# Patient Record
Sex: Female | Born: 1935 | Race: White | Hispanic: No | Smoking: Never smoker
Health system: Southern US, Community
[De-identification: ages and names within clinical notes are randomized; demographics above are authoritative.]

## PROBLEM LIST (undated history)

## (undated) DIAGNOSIS — N289 Disorder of kidney and ureter, unspecified: Secondary | ICD-10-CM

## (undated) DIAGNOSIS — H04129 Dry eye syndrome of unspecified lacrimal gland: Secondary | ICD-10-CM

## (undated) DIAGNOSIS — M353 Polymyalgia rheumatica: Secondary | ICD-10-CM

## (undated) DIAGNOSIS — E611 Iron deficiency: Secondary | ICD-10-CM

## (undated) DIAGNOSIS — G20 Parkinson's disease: Secondary | ICD-10-CM

## (undated) DIAGNOSIS — M8580 Other specified disorders of bone density and structure, unspecified site: Secondary | ICD-10-CM

## (undated) DIAGNOSIS — K5790 Diverticulosis of intestine, part unspecified, without perforation or abscess without bleeding: Secondary | ICD-10-CM

## (undated) DIAGNOSIS — I7300 Raynaud's syndrome without gangrene: Secondary | ICD-10-CM

## (undated) DIAGNOSIS — G20A1 Parkinson's disease without dyskinesia, without mention of fluctuations: Secondary | ICD-10-CM

## (undated) DIAGNOSIS — S32000A Wedge compression fracture of unspecified lumbar vertebra, initial encounter for closed fracture: Secondary | ICD-10-CM

## (undated) DIAGNOSIS — F32A Depression, unspecified: Secondary | ICD-10-CM

## (undated) DIAGNOSIS — C801 Malignant (primary) neoplasm, unspecified: Secondary | ICD-10-CM

## (undated) DIAGNOSIS — I10 Essential (primary) hypertension: Secondary | ICD-10-CM

## (undated) DIAGNOSIS — I471 Supraventricular tachycardia, unspecified: Secondary | ICD-10-CM

## (undated) DIAGNOSIS — K219 Gastro-esophageal reflux disease without esophagitis: Secondary | ICD-10-CM

## (undated) DIAGNOSIS — R002 Palpitations: Secondary | ICD-10-CM

## (undated) DIAGNOSIS — E079 Disorder of thyroid, unspecified: Secondary | ICD-10-CM

## (undated) DIAGNOSIS — M069 Rheumatoid arthritis, unspecified: Secondary | ICD-10-CM

## (undated) DIAGNOSIS — Z87440 Personal history of urinary (tract) infections: Secondary | ICD-10-CM

## (undated) DIAGNOSIS — M1990 Unspecified osteoarthritis, unspecified site: Secondary | ICD-10-CM

## (undated) DIAGNOSIS — G3184 Mild cognitive impairment, so stated: Secondary | ICD-10-CM

## (undated) DIAGNOSIS — E039 Hypothyroidism, unspecified: Secondary | ICD-10-CM

## (undated) DIAGNOSIS — I739 Peripheral vascular disease, unspecified: Secondary | ICD-10-CM

## (undated) DIAGNOSIS — E785 Hyperlipidemia, unspecified: Secondary | ICD-10-CM

## (undated) DIAGNOSIS — I2510 Atherosclerotic heart disease of native coronary artery without angina pectoris: Secondary | ICD-10-CM

## (undated) DIAGNOSIS — N6459 Other signs and symptoms in breast: Secondary | ICD-10-CM

## (undated) HISTORY — PX: ABDOMINAL HYSTERECTOMY: SHX81

## (undated) HISTORY — PX: JOINT REPLACEMENT: SHX530

## (undated) HISTORY — PX: SKIN BIOPSY: SHX1

## (undated) HISTORY — PX: TEMPORAL ARTERY BIOPSY / LIGATION: SUR132

## (undated) HISTORY — PX: POSTERIOR TIBIAL TENDON REPAIR: SHX6039

## (undated) HISTORY — PX: ABLATION: SHX5711

## (undated) HISTORY — PX: BACK SURGERY: SHX140

## (undated) HISTORY — PX: ANKLE SURGERY: SHX546

## (undated) HISTORY — PX: EYE SURGERY: SHX253

## (undated) HISTORY — PX: BREAST SURGERY: SHX581

## (undated) HISTORY — PX: BREAST EXCISIONAL BIOPSY: SUR124

## (undated) HISTORY — PX: OTHER SURGICAL HISTORY: SHX169

## (undated) HISTORY — PX: KYPHOPLASTY: SHX5884

## (undated) HISTORY — PX: TUBAL LIGATION: SHX77

---

## 1980-05-17 DIAGNOSIS — I219 Acute myocardial infarction, unspecified: Secondary | ICD-10-CM

## 1980-05-17 HISTORY — DX: Acute myocardial infarction, unspecified: I21.9

## 1983-05-18 HISTORY — PX: TOTAL ABDOMINAL HYSTERECTOMY W/ BILATERAL SALPINGOOPHORECTOMY: SHX83

## 1998-05-17 HISTORY — PX: OTHER SURGICAL HISTORY: SHX169

## 2001-05-17 DIAGNOSIS — Z9842 Cataract extraction status, left eye: Secondary | ICD-10-CM

## 2001-05-17 DIAGNOSIS — Z9841 Cataract extraction status, right eye: Secondary | ICD-10-CM

## 2001-05-17 HISTORY — DX: Cataract extraction status, right eye: Z98.41

## 2001-05-17 HISTORY — DX: Cataract extraction status, right eye: Z98.42

## 2001-05-17 HISTORY — PX: CATARACT EXTRACTION W/ INTRAOCULAR LENS  IMPLANT, BILATERAL: SHX1307

## 2004-04-14 ENCOUNTER — Ambulatory Visit: Payer: Self-pay | Admitting: Family Medicine

## 2004-05-17 DIAGNOSIS — Z853 Personal history of malignant neoplasm of breast: Secondary | ICD-10-CM

## 2004-05-17 DIAGNOSIS — C50919 Malignant neoplasm of unspecified site of unspecified female breast: Secondary | ICD-10-CM

## 2004-05-17 HISTORY — DX: Malignant neoplasm of unspecified site of unspecified female breast: C50.919

## 2004-05-17 HISTORY — DX: Personal history of malignant neoplasm of breast: Z85.3

## 2005-03-29 ENCOUNTER — Ambulatory Visit: Payer: Self-pay | Admitting: Family Medicine

## 2005-04-02 ENCOUNTER — Ambulatory Visit: Payer: Self-pay | Admitting: Family Medicine

## 2005-04-16 HISTORY — PX: BREAST LUMPECTOMY: SHX2

## 2005-05-17 HISTORY — PX: SVT ABLATION: EP1225

## 2005-08-31 DIAGNOSIS — I21A1 Myocardial infarction type 2: Secondary | ICD-10-CM

## 2005-08-31 HISTORY — DX: Myocardial infarction type 2: I21.A1

## 2005-09-01 HISTORY — PX: CARDIAC CATHETERIZATION: SHX172

## 2009-07-29 ENCOUNTER — Ambulatory Visit: Payer: Self-pay | Admitting: Family Medicine

## 2010-01-02 ENCOUNTER — Ambulatory Visit: Payer: Self-pay | Admitting: Internal Medicine

## 2011-05-18 DIAGNOSIS — I2699 Other pulmonary embolism without acute cor pulmonale: Secondary | ICD-10-CM

## 2011-05-18 HISTORY — DX: Other pulmonary embolism without acute cor pulmonale: I26.99

## 2012-01-10 HISTORY — PX: TOTAL KNEE ARTHROPLASTY: SHX125

## 2012-01-21 ENCOUNTER — Emergency Department: Payer: Self-pay | Admitting: Emergency Medicine

## 2012-01-21 LAB — COMPREHENSIVE METABOLIC PANEL
Albumin: 3 g/dL — ABNORMAL LOW (ref 3.4–5.0)
Alkaline Phosphatase: 76 U/L (ref 50–136)
Anion Gap: 4 — ABNORMAL LOW (ref 7–16)
BUN: 20 mg/dL — ABNORMAL HIGH (ref 7–18)
Bilirubin,Total: 0.8 mg/dL (ref 0.2–1.0)
Calcium, Total: 9.1 mg/dL (ref 8.5–10.1)
Creatinine: 0.65 mg/dL (ref 0.60–1.30)
Glucose: 93 mg/dL (ref 65–99)
Osmolality: 276 (ref 275–301)
Potassium: 3.8 mmol/L (ref 3.5–5.1)
SGPT (ALT): 17 U/L (ref 12–78)
Sodium: 137 mmol/L (ref 136–145)

## 2012-01-21 LAB — URINALYSIS, COMPLETE
Bilirubin,UR: NEGATIVE
Leukocyte Esterase: NEGATIVE
Nitrite: NEGATIVE
Protein: NEGATIVE
RBC,UR: 5 /HPF (ref 0–5)
Specific Gravity: 1.026 (ref 1.003–1.030)
WBC UR: 1 /HPF (ref 0–5)

## 2012-01-21 LAB — CBC
HCT: 29.2 % — ABNORMAL LOW (ref 35.0–47.0)
MCH: 30.7 pg (ref 26.0–34.0)
MCHC: 34.5 g/dL (ref 32.0–36.0)
RDW: 13.4 % (ref 11.5–14.5)
WBC: 14.4 10*3/uL — ABNORMAL HIGH (ref 3.6–11.0)

## 2012-01-30 ENCOUNTER — Emergency Department: Payer: Self-pay | Admitting: Emergency Medicine

## 2012-01-30 LAB — COMPREHENSIVE METABOLIC PANEL
Albumin: 3 g/dL — ABNORMAL LOW (ref 3.4–5.0)
Alkaline Phosphatase: 76 U/L (ref 50–136)
BUN: 14 mg/dL (ref 7–18)
Bilirubin,Total: 0.4 mg/dL (ref 0.2–1.0)
Calcium, Total: 9.1 mg/dL (ref 8.5–10.1)
Co2: 26 mmol/L (ref 21–32)
Creatinine: 0.67 mg/dL (ref 0.60–1.30)
EGFR (African American): >60
EGFR (Non-African Amer.): >60
Osmolality: 277 (ref 275–301)
SGOT(AST): 32 U/L (ref 15–37)
Sodium: 139 mmol/L (ref 136–145)

## 2012-01-30 LAB — URINALYSIS, COMPLETE
Bilirubin,UR: NEGATIVE
Ketone: NEGATIVE
Ph: 8 (ref 4.5–8.0)
Squamous Epithelial: NONE SEEN

## 2012-01-30 LAB — CBC
MCH: 30.1 pg (ref 26.0–34.0)
MCHC: 33.7 g/dL (ref 32.0–36.0)
Platelet: 483 10*3/uL — ABNORMAL HIGH (ref 150–440)
RBC: 3.6 10*6/uL — ABNORMAL LOW (ref 3.80–5.20)
RDW: 14.9 % — ABNORMAL HIGH (ref 11.5–14.5)

## 2012-01-30 LAB — PROTIME-INR: INR: 1.1

## 2012-01-30 LAB — LIPASE, BLOOD: Lipase: 162 U/L (ref 73–393)

## 2012-01-30 LAB — APTT: Activated PTT: 36 secs — ABNORMAL HIGH (ref 23.6–35.9)

## 2012-04-07 ENCOUNTER — Ambulatory Visit: Payer: Self-pay

## 2012-04-07 LAB — URINALYSIS, COMPLETE
Glucose,UR: NEGATIVE mg/dL (ref 0–75)
Nitrite: NEGATIVE
Protein: 100
Specific Gravity: >=1.03 (ref 1.003–1.030)

## 2012-04-07 LAB — CBC WITH DIFFERENTIAL/PLATELET
Basophil #: 0.1 10*3/uL (ref 0.0–0.1)
Basophil %: 1.1 %
Eosinophil #: 0.1 10*3/uL (ref 0.0–0.7)
Eosinophil %: 0.8 %
HCT: 41 % (ref 35.0–47.0)
HGB: 13.5 g/dL (ref 12.0–16.0)
Lymphocyte %: 17 %
Monocyte #: 0.9 x10 3/mm (ref 0.2–0.9)
Monocyte %: 9.1 %
Neutrophil #: 6.8 10*3/uL — ABNORMAL HIGH (ref 1.4–6.5)
Neutrophil %: 72 %
RBC: 4.65 10*6/uL (ref 3.80–5.20)
WBC: 9.4 10*3/uL (ref 3.6–11.0)

## 2012-04-07 LAB — BASIC METABOLIC PANEL
Anion Gap: 6 — ABNORMAL LOW (ref 7–16)
Chloride: 102 mmol/L (ref 98–107)
Co2: 31 mmol/L (ref 21–32)
EGFR (Non-African Amer.): >60
Osmolality: 279 (ref 275–301)

## 2012-04-07 LAB — PROTIME-INR
INR: 1.8
Prothrombin Time: 21.5 secs — ABNORMAL HIGH (ref 11.5–14.7)

## 2012-04-08 LAB — URINE CULTURE

## 2012-05-11 ENCOUNTER — Emergency Department: Payer: Self-pay | Admitting: Emergency Medicine

## 2012-05-11 LAB — URINALYSIS, COMPLETE
Bacteria: NONE SEEN
Glucose,UR: NEGATIVE mg/dL (ref 0–75)
Ketone: NEGATIVE
Nitrite: NEGATIVE
RBC,UR: 13 /HPF (ref 0–5)
Specific Gravity: 1.015 (ref 1.003–1.030)
WBC UR: <1 /HPF (ref 0–5)

## 2012-05-20 LAB — PROTIME-INR: INR: 2.6

## 2012-05-21 ENCOUNTER — Inpatient Hospital Stay: Payer: Self-pay | Admitting: Family Medicine

## 2012-05-21 LAB — CBC WITH DIFFERENTIAL/PLATELET
Basophil #: 0.1 10*3/uL (ref 0.0–0.1)
Basophil #: 0 10*3/uL (ref 0.0–0.1)
Basophil %: 0.5 %
Basophil %: 0.2 %
Eosinophil #: 0 10*3/uL (ref 0.0–0.7)
Eosinophil %: 0.2 %
Eosinophil: 1 %
HCT: 29.9 % — ABNORMAL LOW (ref 35.0–47.0)
HCT: 26.4 % — ABNORMAL LOW (ref 35.0–47.0)
HCT: 22.2 % — ABNORMAL LOW (ref 35.0–47.0)
HGB: 9.5 g/dL — ABNORMAL LOW (ref 12.0–16.0)
HGB: 7.3 g/dL — ABNORMAL LOW (ref 12.0–16.0)
Lymphocyte #: 1.2 10*3/uL (ref 1.0–3.6)
Lymphocyte #: 1.3 10*3/uL (ref 1.0–3.6)
MCH: 27.4 pg (ref 26.0–34.0)
MCH: 28.2 pg (ref 26.0–34.0)
MCHC: 31.8 g/dL — ABNORMAL LOW (ref 32.0–36.0)
MCHC: 33.1 g/dL (ref 32.0–36.0)
MCHC: 33.1 g/dL (ref 32.0–36.0)
MCV: 86 fL (ref 80–100)
MCV: 85 fL (ref 80–100)
Monocyte #: 1.2 x10 3/mm — ABNORMAL HIGH (ref 0.2–0.9)
Monocyte %: 8.3 %
Neutrophil #: 12 10*3/uL — ABNORMAL HIGH (ref 1.4–6.5)
Neutrophil %: 77.9 %
Neutrophil %: 82.9 %
Platelet: 295 10*3/uL (ref 150–440)
RBC: 3.46 10*6/uL — ABNORMAL LOW (ref 3.80–5.20)
RBC: 3.09 10*6/uL — ABNORMAL LOW (ref 3.80–5.20)
RBC: 2.6 10*6/uL — ABNORMAL LOW (ref 3.80–5.20)
Segmented Neutrophils: 76 %
WBC: 10.8 10*3/uL (ref 3.6–11.0)

## 2012-05-21 LAB — CBC
HCT: 39.5 % (ref 35.0–47.0)
HGB: 12.8 g/dL (ref 12.0–16.0)
MCH: 28 pg (ref 26.0–34.0)
MCHC: 32.4 g/dL (ref 32.0–36.0)
MCV: 86 fL (ref 80–100)
Platelet: 372 10*3/uL (ref 150–440)
RBC: 4.58 10*6/uL (ref 3.80–5.20)
RDW: 15.4 % — ABNORMAL HIGH (ref 11.5–14.5)
WBC: 10.2 10*3/uL (ref 3.6–11.0)

## 2012-05-21 LAB — BASIC METABOLIC PANEL
BUN: 17 mg/dL (ref 7–18)
Calcium, Total: 9.3 mg/dL (ref 8.5–10.1)
Co2: 30 mmol/L (ref 21–32)
Creatinine: 0.81 mg/dL (ref 0.60–1.30)
EGFR (Non-African Amer.): >60
Osmolality: 284 (ref 275–301)
Potassium: 4 mmol/L (ref 3.5–5.1)
Sodium: 141 mmol/L (ref 136–145)

## 2012-05-21 LAB — DIFFERENTIAL
Lymphocytes: 16 %
Monocytes: 4 %

## 2012-05-22 LAB — CBC WITH DIFFERENTIAL/PLATELET
Basophil #: 0 10*3/uL (ref 0.0–0.1)
Basophil #: 0.1 10*3/uL (ref 0.0–0.1)
Basophil %: 0.1 %
Basophil %: 0.3 %
Eosinophil #: 0 10*3/uL (ref 0.0–0.7)
Eosinophil #: 0 10*3/uL (ref 0.0–0.7)
Eosinophil %: 0 %
Eosinophil %: 0 %
HCT: 23.5 % — ABNORMAL LOW (ref 35.0–47.0)
HGB: 6.7 g/dL — ABNORMAL LOW (ref 12.0–16.0)
Lymphocyte #: 1.4 10*3/uL (ref 1.0–3.6)
Lymphocyte #: 1.2 10*3/uL (ref 1.0–3.6)
Lymphocyte %: 7.1 %
Lymphocyte %: 10.3 %
MCH: 27.3 pg (ref 26.0–34.0)
MCH: 26.9 pg (ref 26.0–34.0)
MCHC: 36 g/dL (ref 32.0–36.0)
MCHC: 32.2 g/dL (ref 32.0–36.0)
MCV: 85 fL (ref 80–100)
MCV: 85 fL (ref 80–100)
Monocyte %: 9.2 %
Monocyte %: 13.5 %
Neutrophil #: 14.1 10*3/uL — ABNORMAL HIGH (ref 1.4–6.5)
Neutrophil %: 78.9 %
Neutrophil %: 83.6 %
Neutrophil %: 75.9 %
RBC: 2.78 10*6/uL — ABNORMAL LOW (ref 3.80–5.20)
RBC: 2.49 10*6/uL — ABNORMAL LOW (ref 3.80–5.20)
RDW: 15.2 % — ABNORMAL HIGH (ref 11.5–14.5)
RDW: 15.6 % — ABNORMAL HIGH (ref 11.5–14.5)
RDW: 15.4 % — ABNORMAL HIGH (ref 11.5–14.5)
WBC: 16.6 10*3/uL — ABNORMAL HIGH (ref 3.6–11.0)
WBC: 17.5 10*3/uL — ABNORMAL HIGH (ref 3.6–11.0)
WBC: 18.5 10*3/uL — ABNORMAL HIGH (ref 3.6–11.0)

## 2012-05-22 LAB — URINALYSIS, COMPLETE
Glucose,UR: NEGATIVE mg/dL (ref 0–75)
Ketone: NEGATIVE
Nitrite: NEGATIVE
Ph: 5 (ref 4.5–8.0)
Protein: NEGATIVE
Squamous Epithelial: <1

## 2012-05-22 LAB — BASIC METABOLIC PANEL
Anion Gap: 7 (ref 7–16)
BUN: 25 mg/dL — ABNORMAL HIGH (ref 7–18)
Chloride: 105 mmol/L (ref 98–107)
Co2: 30 mmol/L (ref 21–32)
Creatinine: 0.71 mg/dL (ref 0.60–1.30)
EGFR (African American): >60
EGFR (Non-African Amer.): >60
Glucose: 136 mg/dL — ABNORMAL HIGH (ref 65–99)
Sodium: 142 mmol/L (ref 136–145)

## 2012-05-22 LAB — PROTIME-INR
INR: 1.2
Prothrombin Time: 15.5 secs — ABNORMAL HIGH (ref 11.5–14.7)

## 2012-05-23 LAB — CBC WITH DIFFERENTIAL/PLATELET
Eosinophil #: 0 10*3/uL (ref 0.0–0.7)
Eosinophil %: 0.2 %
HCT: 25.2 % — ABNORMAL LOW (ref 35.0–47.0)
Lymphocyte %: 14.2 %
MCH: 28.9 pg (ref 26.0–34.0)
MCHC: 33.8 g/dL (ref 32.0–36.0)
MCV: 89 fL (ref 80–100)
MCV: 88 fL (ref 80–100)
Monocyte #: 2.3 x10 3/mm — ABNORMAL HIGH (ref 0.2–0.9)
Monocyte %: 7.5 %
Neutrophil #: 10.8 10*3/uL — ABNORMAL HIGH (ref 1.4–6.5)
Platelet: 187 10*3/uL (ref 150–440)
RBC: 2.55 10*6/uL — ABNORMAL LOW (ref 3.80–5.20)
RBC: 2.88 10*6/uL — ABNORMAL LOW (ref 3.80–5.20)
RDW: 15 % — ABNORMAL HIGH (ref 11.5–14.5)
WBC: 14.4 10*3/uL — ABNORMAL HIGH (ref 3.6–11.0)
WBC: 12.8 10*3/uL — ABNORMAL HIGH (ref 3.6–11.0)

## 2012-05-23 LAB — BASIC METABOLIC PANEL
Anion Gap: 5 — ABNORMAL LOW (ref 7–16)
Calcium, Total: 8.1 mg/dL — ABNORMAL LOW (ref 8.5–10.1)
Co2: 30 mmol/L (ref 21–32)
Creatinine: 0.53 mg/dL — ABNORMAL LOW (ref 0.60–1.30)
Osmolality: 283 (ref 275–301)
Potassium: 3.8 mmol/L (ref 3.5–5.1)
Sodium: 141 mmol/L (ref 136–145)

## 2012-05-23 LAB — URINE CULTURE

## 2012-05-23 LAB — TROPONIN I: Troponin-I: <0.02 ng/mL

## 2012-05-24 LAB — CBC WITH DIFFERENTIAL/PLATELET
Basophil #: 0.1 10*3/uL (ref 0.0–0.1)
HCT: 21.3 % — ABNORMAL LOW (ref 35.0–47.0)
Lymphocyte #: 2.1 10*3/uL (ref 1.0–3.6)
Lymphocyte %: 20.6 %
MCH: 28.9 pg (ref 26.0–34.0)
Neutrophil #: 6.5 10*3/uL (ref 1.4–6.5)
Platelet: 220 10*3/uL (ref 150–440)
RBC: 2.43 10*6/uL — ABNORMAL LOW (ref 3.80–5.20)
WBC: 10.1 10*3/uL (ref 3.6–11.0)

## 2012-07-11 ENCOUNTER — Ambulatory Visit: Payer: Self-pay | Admitting: Family Medicine

## 2012-07-11 LAB — URINALYSIS, COMPLETE
Bilirubin,UR: NEGATIVE
Glucose,UR: NEGATIVE mg/dL (ref 0–75)
Ph: 6 (ref 4.5–8.0)
RBC,UR: >30 /HPF (ref 0–5)
Specific Gravity: 1.015 (ref 1.003–1.030)

## 2012-08-21 HISTORY — PX: ESOPHAGOGASTRODUODENOSCOPY: SHX1529

## 2012-11-27 ENCOUNTER — Ambulatory Visit: Payer: Self-pay

## 2012-11-27 LAB — URINALYSIS, COMPLETE
Bilirubin,UR: NEGATIVE
Ketone: NEGATIVE
Nitrite: NEGATIVE
Ph: 5 (ref 4.5–8.0)
Protein: NEGATIVE
Squamous Epithelial: NONE SEEN
WBC UR: >30 /HPF (ref 0–5)

## 2013-06-08 ENCOUNTER — Ambulatory Visit: Payer: Self-pay | Admitting: Family Medicine

## 2013-06-08 LAB — URINALYSIS, COMPLETE
Bilirubin,UR: NEGATIVE
GLUCOSE, UR: NEGATIVE mg/dL (ref 0–75)
KETONE: NEGATIVE
Nitrite: POSITIVE
Ph: 6 (ref 4.5–8.0)
SPECIFIC GRAVITY: 1.02 (ref 1.003–1.030)
WBC UR: >30 /HPF (ref 0–5)

## 2013-06-10 LAB — URINE CULTURE

## 2013-08-07 ENCOUNTER — Ambulatory Visit: Payer: Self-pay | Admitting: Family Medicine

## 2013-08-07 LAB — URINALYSIS, COMPLETE
BILIRUBIN, UR: NEGATIVE
Glucose,UR: NEGATIVE mg/dL (ref 0–75)
KETONE: NEGATIVE
NITRITE: NEGATIVE
Ph: 6 (ref 4.5–8.0)
Specific Gravity: >=1.03 (ref 1.003–1.030)

## 2013-08-09 LAB — URINE CULTURE

## 2014-01-30 ENCOUNTER — Ambulatory Visit: Payer: Self-pay | Admitting: Physician Assistant

## 2014-01-30 LAB — URINALYSIS, COMPLETE
GLUCOSE, UR: NEGATIVE
NITRITE: NEGATIVE
Ph: 7 (ref 5.0–8.0)
Protein: 100
Specific Gravity: 1.02 (ref 1.000–1.030)
Squamous Epithelial: NONE SEEN

## 2014-02-01 LAB — URINE CULTURE

## 2014-03-07 ENCOUNTER — Ambulatory Visit: Payer: Self-pay | Admitting: Emergency Medicine

## 2014-03-07 LAB — URINALYSIS, COMPLETE
BACTERIA: NEGATIVE
Squamous Epithelial: NONE SEEN

## 2014-03-09 LAB — URINE CULTURE

## 2014-07-29 ENCOUNTER — Ambulatory Visit: Payer: Self-pay | Admitting: Emergency Medicine

## 2014-07-31 ENCOUNTER — Inpatient Hospital Stay: Payer: Self-pay | Admitting: Internal Medicine

## 2014-09-06 NOTE — H&P (Signed)
PATIENT NAME:  Tracy Long, Tracy Long MR#:  [id number] DATE OF BIRTH:  1934-10-30  DATE OF ADMISSION:  05/21/2012  PRIMARY CARE PHYSICIAN:  Dr. Derwood Kaplan.  CHIEF COMPLAINT:  Right low back pain.   HISTORY OF PRESENT ILLNESS:  A 80 year old female with a history of postop bilateral PE diagnosed September 2013, presents with right pain over her gluteal area.  The patient notes that she was in her usual state of health today, was about to walk her dog when she felt a "tiny sting" in the right gluteal region.  She proceeded to walk her dog and 30 minutes later upon palpation of that area felt a palm-sized fullness with tenderness.  Since then she has continued to notice swelling in the area.  She called her orthopedic surgeon and was advised to present to the Emergency Department so she could get an INR checked.  Upon evaluation in the Emergency Department, patient was noted to have on CAT scan a large superficial hematoma measuring 12 x 3.8 cm, so we are called for admission.   Of note, the patient currently is on dual anticoagulation with Coumadin and Lovenox because she had an epidural injection for spinal stenosis and subsequent low back pain on December 26.  Apparently for 7 days prior to the 26th she was off of Coumadin and after the injection she is being bridged with Lovenox and Coumadin and the plan was for her to present to the [facility] on January 7th to reassess her INR and then discontinue Lovenox, however, she has now presented to the Emergency Department in the interim.  She denies any trauma or precipitating events.  She cannot identify any alleviating or aggravating factors.  She has been icing the area since onset.  The patient had been complaining of increasing shoulder pains and was diagnosed with polymyalgia rheumatica and started on prednisone therapy.  She started prednisone 1 day prior to presentation and has only had 2 doses thus far.    PAST MEDICAL HISTORY:  1.  Bilateral  PEs diagnosed 01/21/2012, after total knee replacement 01/10/2012. 2.  Notable for coronary artery disease, status post MI x 2.  3.  Peripheral vascular disease, status post femoral artery stenting.  4.  Hypertension.  5.  History of benign fibroids bilateral breast, status post excision.  6.  History of right early breast cancer, status post right breast lumpectomy.  7.  History of cardiac ablation for tachyarrhythmia.  8.  Recently diagnosed polymyalgia rheumatica.  9.  Giant cell arteritis.  10.  Raynaud.  11.  Hypothyroidism.  12.  Diverticulosis.  13.  Internal hemorrhoids.    PAST SURGICAL HISTORY: 1.  Total knee replacement, left, 01/10/2012.  2.  Excision of benign fibroids bilateral breasts.  3.  Hysterectomy.  4.  Femoral artery stent placement.  5.  Cataract surgery bilaterally.  6.  Right breast lumpectomy.  7.  Cardiac ablation.  8.  Triple arthrodesis, left foot.  9.  Colonoscopy.  10.  Kyphoplasty.   MEDICATIONS: 1.  Tylenol 500 mg 2 tablets by mouth every 6 hours as needed.  2.  Fosamax 35 mg weekly.  3.  Zyrtec 10 mg daily.  4.  Valium 10 mg 1 to 2 tablets by mouth every 8 hours as needed for muscle spasm.  5.  Lovenox 80 mg subcutaneously every 12 hours.  6.  Metoprolol succinate 25 mg daily.  7.  Multivitamin Centrum Silver daily.  8.  Roxicodone 5 mg 1 to 2 tablets  every 4 to 6 hours as needed for pain. 9.  Paroxetine 10 mg half tablet (5 mg) by mouth daily.  10.  Synthroid 75 mcg daily.  11.  Coumadin 7.5 mg Monday and Wednesday.  12.  Coumadin 5 mg every day except Monday and Wednesday.  13.  Prednisone 20 mg daily and this was just started 05/19/2012 for polymyalgia rheumatica.    SOCIAL HISTORY:  Denies tobacco, alcohol or illicit drug use.   FAMILY HISTORY:  Noncontributory.   REVIEW OF SYSTEMS:  CONSTITUTIONAL:  Denies fevers, fatigue.  EYES:  Denies blurred vision, double vision.  Does have history of cataracts status post surgery.  EARS,  NOSE, THROAT:  Denies tinnitus, ear pain, epistaxis.  RESPIRATIONS:  Denies cough, wheeze, dyspnea, does have history of PE.  CARDIOVASCULAR:  Denies chest pain, edema, palpitations.  GASTROINTESTINAL:  Denies nausea, vomiting, diarrhea, abdominal pain.  GENITOURINARY:  Denies frequency, urgency or dysuria.   INTEGUMENTARY:  Denies any new skin rashes, although she did note the expanding hematoma in the gluteal region this evening.    HEMATOLOGY:  Denies prior history of easy bruising.  MUSCULOSKELETAL:  Has chronic arthritis, chronic bilateral shoulder pain.  No joint swelling.  NEUROLOGIC:  Denies numbness, tingling, dysarthria, headache.  PSYCHIATRIC:  Denies depression, anxiety.   PHYSICAL EXAMINATION: VITAL SIGNS:  Temp 97.6, heart rate 70, respirations 20, blood pressure 166/72, saturating 93% on room air.  GENERAL:  Elderly Caucasian female in mild distress due to pain.  PSYCHIATRIC:  Awake, alert and oriented x 3.  Judgment and insight are intact.   EYES:  Pupils are equally round and reactive to light and accommodation.  Anicteric sclerae.  No conjunctivitis.  EARS, NOSE, THROAT:  Normal external ears and nares.  OROPHARYNX:  Clear without erythema.  RESPIRATIONS:  Breath sounds are normal with normal effort.  CARDIOVASCULAR:  S1, S2, regular rate and rhythm.  No murmurs appreciated.  No pretibial edema.  ABDOMEN:  Soft, nontender, nondistended.  SKIN:  There is a large diameter subcutaneous hematoma on the right gluteal region almost covering the entire gluteal area.  There is only a small superficial ecchymosis noted, however, the hematoma is much deeper and much wider.  It measures 18 cm x 13 cm.  It is tender to palpation.  No other rashes are appreciated or lesions.  Skin is otherwise warm and dry.  MUSCULOSKELETAL:  Full range of motion bilateral upper and lower extremity.  There is some limitation in the movement of the right hip due to pain.     LABORATORY DATA:  INR is 2.6.   PT is 28.1.  WBC is 10.2, hemoglobin is 12.8, hematocrit is 39.5, platelet count is 372, MCV is 86.   Glucose 124, BUN of 17, creatinine 0.81, sodium 141, potassium 4, chloride 105, bicarb 30, calcium 9.3, anion gap of 6, osmolality 284.   ABO group is O+ with negative antibody screen.   ASSESSMENT AND PLAN:  A 80 year old female on dual anticoagulation for pulmonary embolus since December 26 due to recent epidural injection, presents with spontaneous superficial large hematoma of the right gluteal region.  1.  Right gluteal hematoma.  At this point we will reverse her coagulopathy with fresh frozen plasma.  She is not supratherapeutic with her INR, but given the active bleeding we will reverse it.  We will give her vitamin K as well.  If it continues to expand, she might need surgical evacuation.  Continue pain medications as needed.  She might  need a repeat CT to ensure that the hematoma is no longer expanding over the next 24 to 48 hours.  We will hold her anticoagulation at this point.  We will check CBCs every 12 hours for the next 24 hours to assess for anemia of acute blood loss. 2.  Polymyalgia rheumatica.  Continue prednisone.  3.  Hypothyroidism.  Continue Synthroid.  4.  Hypertension.  Continue metoprolol.   5.  Continue all home medications.  6.  Deep vein thrombosis prophylaxis:  Pneumatic compression boots.   CODE STATUS:  FULL CODE:    SURROGATE DECISION-MAKER:  Her husband, Tracy Long.   TIME SPENT COORDINATING ADMISSION:  Forty-five minutes.     ____________________________ Samson Frederic, DO aeo:ea D: 05/21/2012 01:12:00 ET T: 05/21/2012 04:18:31 ET JOB#: [id number]  cc: Dr. Redmond Pulling Dr. Aaron Edelman Hallstetter Samson Frederic, DO, <Dictator>   [place] E Melba Araki DO ELECTRONICALLY SIGNED 07/11/2012 3:43

## 2014-09-06 NOTE — Discharge Summary (Signed)
PATIENT NAME:  Tracy Long, Tracy Long MR#:  [id number] DATE OF BIRTH:  1934-12-03  DATE OF ADMISSION:  05/21/2012 DATE OF DISCHARGE:  05/25/2012  ADMISSION DIAGNOSIS:  Hematoma of the lower back.   DISCHARGE DIAGNOSES:   1.  Hematoma of the lower back after epidural injection.  2.  Anticoagulation with Coumadin, now stopped.  3.  Shock, hypovolemic and hemorrhagic.  4.  History of pulmonary embolus after total knee replacement and deep vein thrombosis.  5.  Now ultrasound Doppler of the lower extremities shows no longer deep vein thrombosis.  6.  Polymyalgia rheumatica on prednisone.  7.  Hypothyroidism.  8.  Hypertension.  9.  History of atrial fibrillation status post ablation. The patient is always tachycardic for which she takes metoprolol.  10.  Anemia of acute blood loss from her hematoma status post transfusions.  11.  Increased white blood cells due to transfusions.   FOLLOWUP:   1.  With Dr. Arlana Hove at [facility].  2.  With Dr. Sharyne Richters, spine surgeon, from [facility].  3.  With pain management, Dr. Maricela Curet, from [facility].   MEDICATIONS AT DISCHARGE:  Zyrtec 10 mg once daily, Synthroid 75 mcg once daily, paroxetine 5 mg once daily, alendronate 35 mg once weekly, metoprolol succinate extended release 25 mg once daily, Centrum Silver once a day, Valium 2 mg 1 to 2 tablets as needed for pain, oxycodone 5 mg 1 to 2 tablets every 4 hours p.r.n. pain, Tylenol p.r.n. pain, prednisone 20 mg once daily, Niferex 150 mg twice daily, aspirin, enteric coated 325 mg (do not take this medication until you see your primary care physician again).   DIAGNOSTIC DATA:  CT scan of the pelvis without contrast showed a large superficial hematoma that extends from the level of L5 to mid sacrum. She had a repeat CT scan done on January 6th that showed extension of that same hematoma with expansion and abnormal attenuation in the subcutaneous tissue of the posterior pelvic  region. Her creatinine was 0.8 and remained normal. Her hemoglobin on admission was 12.8 and went down to 7.3 the following day after admission and the lowest it got was 6.7. The patient was transfused about 3 units of fresh frozen plasma and 4 units of blood. Her INR on admission was 2.6 and at discharge was 1.0. UA did not show any major signs of infection. White blood cells were elevated at 11, but no growth on urine culture. EKG: Normal sinus rhythm.   HOSPITAL COURSE:  The patient is a very nice 80 year old female who comes to the hospital with a history of pain on her gluteal area after she started walking and then noticed some bruising and increase of size and fullness of her lower back. The patient went to the Emergency Department and she was found to have a superficial hematoma measuring 12 x 3.8 cm for which she was admitted at that moment. She was anticoagulated with Coumadin and Lovenox due to the fact that the patient had an epidural injection due to spinal stenosis and that epidural injection was done on December 26th. She was off of Coumadin after the injection and she was bridged with Lovenox. She had an appointment to see the Coumadin Clinic on January 7th, but unfortunately she started having these problems before. The patient was admitted to the floor. She was getting treatments with pain medications and she was getting first 2 units of FFP and she closely monitored. On the floor, she started becoming  hypotensive on January 6th for which she was transferred to the ICU. She was tachycardic and her blood pressure went down to 60s/40s for which she was transfused with 2 units of packed red blood cells. She also had another unit of fresh frozen plasma. The case was discussed with Dr. Redmond Pulling who is one of the pain management doctors from [facility]. He felt confident that the patient did not need any surgical management and overall we felt comfortable with that. We did not think that she  would benefit from it due to her anticoagulation anyway. She had monitoring of her hemoglobin and her hemoglobin dropped down to 6.4 which was the lowest. She had several blood transfusions, about 4, and overall her shock resolved.   As far as problems:  1.  Shock, hypovolemic and hemorrhagic. The patient was transfused with blood and fluids were given. Her blood pressure was as low as 60/40. She did not require any pressors after her anticoagulation was reversed and she was transfused and the shock resolved. She had significant signs of hypoperfusion, clammy skin, very pale and decreased mentation. All this resolved after the volume was restored.  2.  Hematoma. The patient received FFP. A followup CT scan showed growth of the hematoma, but clinically, she started showing signs of decreased volume and her hemodynamic instability improved for which no repeat CT scan at discharge was done. Her INR was 2.6 and after reversal was 1.0.  3.  History of PE. The patient has a history of a pulmonary embolus that happened right after a total knee replacement. The patient had a small DVT at that moment. She was anticoagulated for over 4 months. We repeated ultrasound of the lower extremities over here that did not show any blood clots due to this problem. We recommend to stop anticoagulation at this moment and follow up with her primary care physician to make a decision of whether to continue or stop anticoagulation. We recommend to stop since the patient has been treated for over 3 months and there are no more signs of DVT on her ultrasound.  4.  Tachycardia. The patient has history of ablation and she takes metoprolol due to her tachycardia. She was normal sinus rhythm during this hospitalization. She had several episodes of fluttering of her chest that resolved after transfusions and with some morphine occasionally. There was no elevation of her cardiac enzymes and no changes in her EKG.  5.  Acute blood loss anemia  due to her hematoma. The patient is discharged on iron pills.  6.  Other medical problems were stable during this hospitalization.   TIME SPENT:  I spent about 80 minutes with this discharge.    ____________________________ Combine Sink, MD rsg:si D: 05/25/2012 15:57:11 ET T: 05/25/2012 21:10:32 ET JOB#: [id number]  cc: [place] Sink, MD, <Dictator> Lalla Brothers, MD Joyice Faster. Dimmig, MD Norvel Richards, MD  [place]las MD ELECTRONICALLY SIGNED 05/26/2012 13:48

## 2014-09-06 NOTE — Discharge Summary (Signed)
PATIENT NAME:  TAMORAH, HADA MR#:  [id number] DATE OF BIRTH:  08/08/1934  DATE OF ADMISSION:  05/21/2012 DATE OF DISCHARGE:  05/25/2012  ADMISSION DIAGNOSIS: Hematoma.  DISCHARGE DIAGNOSES: 1. Hematoma after epidural.  2. Hypovolemic and hemorrhagic shock.  3. History of pulmonary embolus.  4. History of polymyalgia rheumatica. 5. Hypothyroidism.  6. Anemia of chronic disease.   CONSULTS: Wound consult.   LABORATORY AND RADIOLOGICAL DATA AT DISCHARGE:  Hemoglobin is 8.5 CT scan of the pelvis showed expansion of abnormal attenuation and subcutaneous tissues of the posterior pelvic region which can be consistent with an enlarging hematoma.   HOSPITAL COURSE: This is a 80 year old female with a history of PE on anticoagulation who had an epidural prior and presented with a large hematoma. For further details, please refer to the H and P.   1. Shock: The patient presented with hypotension and blood pressure as low as 60/40 with tachycardia, secondary to hypovolemia and hemorrhage. This has now resolved with appropriate treatment for hematoma as outlined below.  2. Hematoma: Status post a total of 4 units of FFP as well as 3 units of  PRBCs. Her hemoglobin remained stable. She is off of Coumadin and should remain off of Coumadin. She has a large hematoma on the back which is bilateral in nature, and she has a few blisters as well.  3. History of pulmonary emboli after an orthopedic procedure: The patient had this in September. She has been on therapy for 3 months. At this time, anticoagulation is not indicated. Her Doppler ultrasound was negative for DVT. She has received 3 months of therapy, and it is reasonable to stop anticoagulation at this time.  4. History of polymyalgia rheumatica: The patient will resume prednisone. 5. Hypothyroidism: On Synthroid.  6. Hypertension: On metoprolol.  7. Anemia, acute on chronic: Status post 3 units of PRBCs.   DISCHARGE MEDICATIONS: 1. Zyrtec 10 mg  daily. 2. Synthroid 75 mcg daily.  3. Paxil 5 mg daily.  4. Alendronate 35 mg weekly.  5. Metoprolol 25 mg daily.  6. Centrum Silver 1 tablet daily.  7. Valium 2 mg, 1 to 2 tablets every 8 hours p.r.n. muscle spasm.  8. Oxycodone 5 mg, 1 to 2 tablets q. 4 to 6 hours p.r.n. pain.  9. Acetaminophen 500 mg, 2 tablets q. 6 hours.  10. Prednisone 20 mg daily.  11. Niferex 150 mg b.i.d.  12. Aspirin 325 daily.   NOTE: The patient will stop taking Lovenox and Coumadin.   DISCHARGE REFERRALS: Home health and with nurse and wound care.   DISCHARGE DIET: Regular.   DISCHARGE FOLLOWUP:  1. Dr. Arlana Hove, [facility], in 5 to 7 days.  2. Dr. Joselyn Arrow, Spine Surgery, in 1 to 2 weeks. 3. Pain Management in 1 to 2 weeks.   TIME SPENT: Approximately 35 minutes.  ____________________________ Donell Beers. Benjie Karvonen, MD spm:cb  D: 05/25/2012 13:38:45 ET T: 05/25/2012 15:36:44 ET JOB#: [id number] cc: Braydon Kullman P. Benjie Karvonen, MD, <Dictator>  Dr. Arlana Hove, [facility] Caelie [place] P Cam Harnden MD ELECTRONICALLY SIGNED 05/25/2012 19:52

## 2014-09-15 NOTE — H&P (Signed)
PATIENT NAME:  Tracy Long, Tracy Long MR#:  [id number] DATE OF BIRTH:  October 26, 1934  DATE OF ADMISSION:  07/31/2014  PRIMARY CARE PROVIDER: [facility].  EMERGENCY DEPARTMENT REFERRING: Dr. Jimmye Norman   CHIEF COMPLAINT: Substernal chest pressure.   HISTORY OF PRESENT ILLNESS: The patient is a 80 year old white female with a history of having pulmonary embolism after total knee replacement, history of coronary artery disease with MI x 2, according to her a long time ago, which did not require any type of intervention, who also is under a lot of stress and has been depressed and grieving due to her husband passing away in January, who developed substernal chest pressure starting this 8:00 a.m. like an elephant sitting on her chest. She also had pain radiating on both sides of her chest as well as her shoulders. She did not have any shortness of breath. Did not have any nausea, vomiting or any tingling. The patient came to the ED with these symptoms and was noted to have a troponin that was elevated at 0.13. Prior to this episode,  she has recently has not been having any shortness of breath or chest pain.   PAST MEDICAL HISTORY: 1.  History of bilateral PEs diagnosed  on 01/21/2012 after a total knee replacement on 01/10/2012.  2.  Coronary artery disease, status post MI x 2 in the 1990s.  3.  Peripheral vascular disease status post left femoral artery stenting.  4.  Hypertension.  5.  History of benign fibroids bilateral breasts status post excision.  6.  History of right breast early cancer, status post right breast lumpectomy.  7.  History of cardiac ablation for tachy arrhythmia recently diagnosed PMR.  8.  Giant cell arthritis.  9.  History of Raynaud phenomenon.  10.   Hypothyroidism.  11.   History of diverticulosis.  12.   History of internal hemorrhoids.  13.   History of hematoma of the lower back after epidural injection in 05/2012.    PAST SURGICAL HISTORY:  1.  Status post total knee  replacement on the left on 01/10/2012.  2.  Excision of benign fibroid bilateral breasts.  3.  Hysterectomy.  4.  Femoral artery stent placement.  5.  Cataract surgery bilaterally.  6.  Right breast lumpectomy.  7.  Status post cardiac ablation.  8.  Status post left foot triple arthrodesis.  9.  Status post kyphoplasty.   ALLERGIES: TO MULTIPLE MEDICATIONS INCLUDING ALOE DERIVATIVES, CARDIZEM, DOXYCYCLINE, EPINEPHRINE, MACROBID, MUCINEX, PENICILLIN, STATINS, TETRACYCLINES.   MEDICATIONS AT HOME: Synthroid 75 mcg daily, Premarin vaginal 2 to 3 times a day, paroxetine 10 mg half tab p.o. daily, omeprazole 40 daily, metoprolol succinate 50 one tab p.o. daily, fluticasone 50 mcg 2 sprays daily, docusate sodium 1 cap daily, diazepam 2 mg 1 to 2 tabs every 8 hours as needed, clindamycin 150 three caps prior to dental appointments, Ciprofloxacin 500 mg 1 tab p.o. b.i.d., cetirizine 10 daily, Centrum Silver 1 tab p.o. daily, calcium plus vitamin D 1 tab p.o. daily, aspirin 325 p.o. daily, alendronate 35 mg once a week, acetaminophen 2 tabs q. 6 p.r.n.   SOCIAL HISTORY: No tobacco, alcohol or illicit drug use.   FAMILY HISTORY: Mother with hypertension.   REVIEW OF SYSTEMS: CONSTITUTIONAL: Denies any fevers. Complains of some weakness. Complains of chest pressure. No weight loss or weight gain.  EYES: No blurred or double vision. No redness. No erythema.  ENT: No tinnitus. No ear pain. No hearing loss. No seasonal or year-round  allergies. No epistaxis. No nasal discharge.  RESPIRATORY: Denies any cough, wheezing, hemoptysis.  CARDIOVASCULAR: Complains of chest pressure as above. No orthopnea. No edema. No arrhythmias. No palpitations.  GASTROINTESTINAL: No nausea, vomiting, diarrhea. No abdominal pain. No hematemesis. No hematochezia.  GENITOURINARY: Denies any dysuria, hematuria, renal colic or frequency.  ENDOCRINE: Denies any polyuria, nocturia.  HEMATOLOGIC AND LYMPHATIC: Denies anemia, easy  bruisability or bleeding.  SKIN: No acne. No rash.  MUSCULOSKELETAL: No pain in the neck, back or shoulder.  NEUROLOGIC: No CVA, TIA, or seizures.  PSYCHIATRIC: Has some anxiety and depression, currently going through the grieving process.   PHYSICAL EXAMINATION: VITAL SIGNS: Temperature 97.9, pulse 54, respirations 18, blood pressure 146/76, O2 of 98%.  GENERAL: The patient is an obese female, appears in no acute distress.  HEENT: Head atraumatic, normocephalic. Pupils equally round, reactive to light and accommodation. There is no conjunctival pallor. No sclerae icterus. Nasal exam shows no drainage or ulceration. Oropharynx is clear without any exudate.  NECK: Supple without any thyromegaly.  CARDIOVASCULAR: Regular rate and rhythm. No murmurs, rubs, clicks, or gallops.  LUNGS: Clear to auscultation bilaterally without any rales, rhonchi, wheezing.  ABDOMEN: Soft, nontender, nondistended. Positive bowel sounds x 4.  CARDIOVASCULAR: S1, S2 positive. No murmurs, rubs, clicks, or gallops. PMI is not displaced.  EXTREMITIES: No clubbing, cyanosis, or edema.  SKIN: No rash.  LYMPH NODES: Nonpalpable.  MUSCULOSKELETAL: There is no erythema or swelling.  VASCULAR: Good DP/PT pulses.  PSYCHIATRIC: Not anxious or depressed.  NEUROLOGIC: Awake, alert, oriented x 3. No focal deficits.  LYMPH NODES: Nonpalpable.   LABORATORY DATA: Glucose 83, BUN 95, creatinine 0.80, sodium 140, potassium 3.6. LFTs are normal except total protein of 6.4. CPK 197, CK-MB 3.1. Troponin 0.13. WBC 9.8, hemoglobin 13.5, platelet count 263,000. INR 0.9. EKG: Normal sinus rhythm without any ST-T wave changes.   ASSESSMENT AND PLAN: The patient is a 80 year old white female with history of myocardial infarction in the past, no intervention, presents with chest pain.  1.  Chest pain. At this time, we will do serial cardiac enzymes and place her on aspirin. Place her on full dose Lovenox. Cardiology consult. Fasting lipid  panel in the morning. We will place her on nitroglycerin as well.  2.  Hypertension. We will continue metoprolol, adjust her medications as needed.  3.  Gastroesophageal reflux disease. We will continue her Protonix.  4.  Depression and anxiety. We will continue Paxil. She was tearful in the ED. I offered her for psychiatric evaluation and she states that she does not need a psychiatric evaluation. She will be on diazepam q. 8 p.r.n. for anxiety.  5.  Miscellaneous. The patient will be on full dose Lovenox.   TIME SPENT ON THIS PATIENT'S HISTORY AND PHYSICAL: 50 minutes.    ____________________________ Lafonda Mosses Posey Pronto, MD shp:at D: 07/31/2014 13:45:55 ET T: 07/31/2014 14:03:37 ET JOB#: [id number]  cc: Mollee Neer H. Posey Pronto, MD, <Dictator> Alric Seton MD ELECTRONICALLY SIGNED 08/12/2014 14:16

## 2014-09-15 NOTE — Discharge Summary (Signed)
PATIENT NAME:  Tracy Long, Tracy Long MR#:  [id number] DATE OF BIRTH:  10-09-1934  DATE OF ADMISSION:  07/31/2014 DATE OF DISCHARGE:  08/01/2014  For detailed note, please refer to the history and physical done on admission by Dr. Dustin Flock.  DIAGNOSES AT DISCHARGE:  As follows: Chest pain, likely anxiety mediated or musculoskeletal in nature, hypertension, anxiety, hypothyroidism, history of giant cell arteritis, polymyalgia rheumatica.   DIET:  The patient is being discharged on a low sodium, low fat diet.   ACTIVITY: As tolerated.   FOLLOW-UP:   Dr. Neoma Laming in the next 1-2 weeks.     DISCHARGE MEDICATIONS: Omeprazole 40 mg daily, ciprofloxacin 5 mg b.i.d. x 7 days, Toprol 50 mg daily, Paxil 5 mg daily, Tylenol 500 mg 2 tabs q. 6 hours as needed, Fosamax 35 mg weekly, aspirin 325 mg daily, calcium and vitamin D 1 tab b.i.d., Centrum multivitamin daily, cetirizine 10 mg daily, clindamycin 150 mg capsule 3 capsules prior to dental procedures, diazepam 2 mg 1-2 tabs q. 8 hours as needed, Colace 100 mg daily as needed, Flonase 2 sprays to each nostril daily, Synthroid 75 mcg daily, prednisone 6 mg daily.   [place] COURSE: Dr. Neoma Laming from  cardiology.   PERTINENT STUDIES DONE DURING THE HOSPITAL COURSE: A cardiac catheterization done on March 17 showing ejection fraction to be 60% with normal coronaries.  No evidence of any  hemodynamically significant coronary disease.   HOSPITAL COURSE: This is a 80 year old female with medical problems as mentioned above, presented to the hospital with chest pain and noted to have an elevated troponin.  1. Chest pain. The patient's symptoms were worrisome for angina given her elevated troponin and previous history, therefore, the patient was observed on telemetry, had 3 sets of cardiac markers checked, which were mildly elevated.   The patient was, therefore, seen by cardiology.  They  ended up doing a cardiac catheterization on  the patient  which showed no hemodynamically significant coronary disease. Therefore,  the most likely cause of the chest pain was  anxiety mediated or musculoskeletal in nature. For now, she is being discharged on her home medications including aspirin, beta blocker, and statin with close follow-up with cardiology as an outpatient.  2. Hypertension. The patient already hemodynamically stable. She will continue her Toprol.  3. History of giant cell arteritis and polymyalgia rheumatica.   She follows up with a rheumatologist in [place]. She will continue her maintenance prednisone.  4. Hypothyroidism. She was maintained on her Synthroid. She will resume that.  5. Gastroesophageal reflux disease. The patient was maintained on Protonix. She will resume that.  6. Anxiety.   The patient was maintained on Paxil.   She will resume that.  7. History of recent urinary tract infection. The patient will finish her his oral dose of ciprofloxacin as mentioned.   CODE STATUS: The patient is a full code.   DISPOSITION: She was discharged home.   TIME SPENT: Forty minutes.     ____________________________ Belia Heman. Verdell Carmine, MD vjs:tr D: 08/01/2014 17:41:26 ET T: 08/01/2014 17:53:57 ET JOB#: [id number]  cc: Belia Heman. Verdell Carmine, MD, <Dictator> Dionisio David, MD Henreitta Leber MD ELECTRONICALLY SIGNED 08/06/2014 17:28

## 2014-09-15 NOTE — Consult Note (Signed)
PATIENT NAME:  Tracy Long, Tracy Long MR#:  [id number] DATE OF BIRTH:  [birth year]  DATE OF CONSULTATION:  07/31/2014  REFERRING PHYSICIAN:   CONSULTING PHYSICIAN:  Kelby Fam. Tykiera Raven, PA-C  INDICATION FOR CONSULTATION: Non- ST segment elevation myocardial infarction.   HISTORY OF PRESENT ILLNESS: This is a 80 year old pleasant white female with past medical history of hypertension, MI, history of atrial fibrillation, peripheral vascular disease, who presented to the Emergency Room complaining of chest pain this morning. She states chest pain started at 8:30 in the morning while she was eating breakfast. She describes the pain as pressure-type, substernal in location with radiation to bilateral shoulders, bilateral scapula, and bilateral jaw. She denies any shortness of breath, diaphoresis, nausea, or palpitation at this time.   PAST MEDICAL HISTORY: Borderline hypertension, history of MI in 1982 with no intervention, history of atrial fibrillation status post ablation in 2007, history of peripheral vascular disease with PCI to the left femoral artery.   FAMILY HISTORY: Mother had atrial fibrillation, no significant CAD.   SOCIAL HISTORY: No EtOH, tobacco, or other illicit drug use.   HOME MEDICATIONS: Synthroid 75 mcg daily, omeprazole 40 mg daily, metoprolol succinate 50 mg daily, aspirin 325 mg daily.   ALLERGIES: CARDIZEM, DOXYCYCLINE, EPINEPHRINE, MACROBID, MUCINEX, PENICILLIN, STATINS, AND TETRACYCLINES.   REVIEW OF SYSTEMS:  CONSTITUTIONAL: No fatigue or malaise.  RESPIRATORY: No cough, shortness of breath, or hemoptysis.  CARDIOVASCULAR: Chest pain described earlier, has since resolved. No palpitations, orthopnea, or PND.  GASTROINTESTINAL: No nausea, vomiting or abdominal pain.   PHYSICAL EXAMINATION:   VITAL SIGNS: Temperature 97.9, pulse 54, blood pressure 146/76, respirations 18, pulse oximetry 98% saturation on room air.  GENERAL: A and O x 3, in no acute distress.  HEENT: No JVD or  carotid bruit.  CARDIOVASCULAR: Normal S1, S2. Regular rate and rhythm. No audible murmur.  LUNGS: Clear to auscultation bilaterally. No wheezes, rhonchi, or rales.  ABDOMEN: Soft, nontender. Positive bowel sounds.  EXTREMITIES: No pedal edema.   LABORATORY DATA: Creatinine 0.8. First troponin 0.13. EKG shows normal sinus rhythm, 61 beats per minute, nonspecific ST and T changes.   ASSESSMENT AND PLAN: Non-ST segment elevation myocardial infarction. Agree with continuing aspirin, Lovenox and metoprolol. Plan on cardiac catheterization first thing in the morning. We will continue to follow. Thank you very much for this consult     ____________________________ Kelby Fam. Dorena Cookey, PA-C ear:mw D: 07/31/2014 15:01:51 ET T: 07/31/2014 15:09:30 ET JOB#: [id number]  cc: Dyann Ruddle A. [place], <Dictator> Kelby Fam Caylin Raby PA ELECTRONICALLY SIGNED 08/16/2014 13:45

## 2016-03-16 ENCOUNTER — Encounter: Payer: Self-pay | Admitting: *Deleted

## 2016-03-16 ENCOUNTER — Ambulatory Visit
Admission: EM | Admit: 2016-03-16 | Discharge: 2016-03-16 | Disposition: A | Discharge status: Home / Self Care | Payer: Medicare Other | Attending: Family Medicine | Admitting: Family Medicine

## 2016-03-16 DIAGNOSIS — Z87440 Personal history of urinary (tract) infections: Secondary | ICD-10-CM | POA: Insufficient documentation

## 2016-03-16 DIAGNOSIS — N3090 Cystitis, unspecified without hematuria: Secondary | ICD-10-CM

## 2016-03-16 DIAGNOSIS — I10 Essential (primary) hypertension: Secondary | ICD-10-CM | POA: Insufficient documentation

## 2016-03-16 DIAGNOSIS — N289 Disorder of kidney and ureter, unspecified: Secondary | ICD-10-CM | POA: Diagnosis not present

## 2016-03-16 DIAGNOSIS — M1990 Unspecified osteoarthritis, unspecified site: Secondary | ICD-10-CM | POA: Insufficient documentation

## 2016-03-16 DIAGNOSIS — R350 Frequency of micturition: Secondary | ICD-10-CM | POA: Insufficient documentation

## 2016-03-16 DIAGNOSIS — N390 Urinary tract infection, site not specified: Secondary | ICD-10-CM

## 2016-03-16 DIAGNOSIS — E079 Disorder of thyroid, unspecified: Secondary | ICD-10-CM | POA: Diagnosis not present

## 2016-03-16 HISTORY — DX: Unspecified osteoarthritis, unspecified site: M19.90

## 2016-03-16 HISTORY — DX: Essential (primary) hypertension: I10

## 2016-03-16 HISTORY — DX: Disorder of thyroid, unspecified: E07.9

## 2016-03-16 HISTORY — DX: Disorder of kidney and ureter, unspecified: N28.9

## 2016-03-16 HISTORY — DX: Malignant (primary) neoplasm, unspecified: C80.1

## 2016-03-16 LAB — URINALYSIS COMPLETE WITH MICROSCOPIC (ARMC ONLY)
BILIRUBIN URINE: NEGATIVE
GLUCOSE, UA: NEGATIVE mg/dL
Ketones, ur: NEGATIVE mg/dL
Nitrite: NEGATIVE
Protein, ur: 30 mg/dL — AB
SQUAMOUS EPITHELIAL / LPF: NONE SEEN
Specific Gravity, Urine: 1.02 (ref 1.005–1.030)
pH: 5.5 (ref 5.0–8.0)

## 2016-03-16 MED ORDER — PHENAZOPYRIDINE HCL 200 MG PO TABS: 200.0000 mg | ORAL_TABLET | Freq: Three times a day (TID) | ORAL | 0 refills | Status: DC

## 2016-03-16 MED ORDER — CIPROFLOXACIN HCL 500 MG PO TABS: 500.0000 mg | ORAL_TABLET | Freq: Two times a day (BID) | ORAL | 0 refills | Status: DC

## 2016-03-16 NOTE — ED Triage Notes (Signed)
Urinary frequency/urgency upon waking this am. Has had UTI's in the past.

## 2016-03-16 NOTE — ED Provider Notes (Signed)
[facility]    CSN: [id number] Arrival date & time: 03/16/16  [id number]     History   Chief Complaint Chief Complaint  Patient presents with  . Urinary Frequency  . Urinary Urgency    HPI Tracy Long is a 81 y.o. female.   Patient is a 81 year old white female with dysuria frequency. She states that the urine is low back pain today it started with symptoms this morning when she got up. She states that if she doesn't get treated early in UC gets worse quickly. She reports 2 years ago having multiple UTIs apparently her doctor was a little bit further away but she now has a more local PCP she can stay on it easier now. She's had a hysterectomy, back surgery breast surgery eye surgery joint replacement. She denies any medical problems but in reviewing her records she has a history of arthritis cancer hypertension renal disease disorder and thyroid disease. She has multiple allergies Bactrim, doxycycline, Cardizem Macrobid penicillins and statins. She is informing of the AmBisome take his Cipro. No pertinent family medical history pertinent to today's visit.   The history is provided by the patient. No language interpreter was used.  Urinary Frequency  This is a new problem. The current episode started 6 to 12 hours ago. The problem occurs constantly. The problem has not changed since onset.Pertinent negatives include no chest pain, no abdominal pain, no headaches and no shortness of breath. Nothing aggravates the symptoms. She has tried nothing for the symptoms. The treatment provided no relief.    Past Medical History:  Diagnosis Date  . Arthritis   . Cancer ([place])   . Hypertension   . Renal disorder   . Thyroid disease     There are no active problems to display for this patient.   Past Surgical History:  Procedure Laterality Date  . ABDOMINAL HYSTERECTOMY    . BACK SURGERY    . BREAST SURGERY    . EYE SURGERY    . JOINT REPLACEMENT    . SKIN BIOPSY       OB History    No data available       Home Medications    Prior to Admission medications   Medication Sig Start Date End Date Taking? Authorizing Provider  acetaminophen (TYLENOL) 500 MG tablet Take 500 mg by mouth every 6 (six) hours as needed.   Yes Historical Provider, MD  aspirin EC 81 MG tablet Take 81 mg by mouth daily.   Yes Historical Provider, MD  calcium carbonate (OSCAL) 1500 (600 Ca) MG TABS tablet Take by mouth 2 (two) times daily with a meal.   Yes Historical Provider, MD  cetirizine (ZYRTEC) 10 MG tablet Take 10 mg by mouth daily.   Yes Historical Provider, MD  cholecalciferol (VITAMIN D) 400 units TABS tablet Take 400 Units by mouth.   Yes Historical Provider, MD  clindamycin (CLEOCIN) 150 MG capsule Take by mouth 3 (three) times daily.   Yes Historical Provider, MD  conjugated estrogens (PREMARIN) vaginal cream Place 1 Applicatorful vaginally daily.   Yes Historical Provider, MD  cycloSPORINE (RESTASIS) 0.05 % ophthalmic emulsion 1 drop 2 (two) times daily.   Yes Historical Provider, MD  fluticasone (VERAMYST) 27.5 MCG/SPRAY nasal spray Place 2 sprays into the nose daily.   Yes Historical Provider, MD  levothyroxine (SYNTHROID, LEVOTHROID) 75 MCG tablet Take 75 mcg by mouth daily before breakfast.   Yes Historical Provider, MD  meloxicam (MOBIC) 15 MG tablet  Take 15 mg by mouth daily.   Yes Historical Provider, MD  metoprolol succinate (TOPROL-XL) 25 MG 24 hr tablet Take 25 mg by mouth daily.   Yes Historical Provider, MD  multivitamin-iron-minerals-folic acid (CENTRUM) chewable tablet Chew 1 tablet by mouth daily.   Yes Historical Provider, MD  PARoxetine (PAXIL) 10 MG tablet Take 10 mg by mouth daily.   Yes Historical Provider, MD  PROPYLENE GLYCOL OP Apply to eye.   Yes Historical Provider, MD  ciprofloxacin (CIPRO) 500 MG tablet Take 1 tablet (500 mg total) by mouth 2 (two) times daily. 03/16/16   Frederich Cha, MD  docusate sodium (COLACE) 100 MG capsule Take 100  mg by mouth 2 (two) times daily.    Historical Provider, MD  phenazopyridine (PYRIDIUM) 200 MG tablet Take 1 tablet (200 mg total) by mouth 3 (three) times daily. 03/16/16   Frederich Cha, MD    Family History History reviewed. No pertinent family history.  Social History Social History  Substance Use Topics  . Smoking status: Never Smoker  . Smokeless tobacco: Never Used  . Alcohol use No     Allergies   Aloe; Bactrim [sulfamethoxazole-trimethoprim]; Cardizem [diltiazem]; Doxycycline; Macrobid [nitrofurantoin]; Penicillins; and Statins   Review of Systems Review of Systems  Constitutional: Negative for activity change and appetite change.  Respiratory: Negative for shortness of breath.   Cardiovascular: Negative for chest pain.  Gastrointestinal: Negative for abdominal pain.  Genitourinary: Positive for dysuria and frequency.  Neurological: Negative for headaches.  All other systems reviewed and are negative.    Physical Exam Triage Vital Signs ED Triage Vitals  Enc Vitals Group     BP 03/16/16 1002 134/79     Pulse Rate 03/16/16 1002 (!) 56     Resp 03/16/16 1002 16     Temp 03/16/16 1002 98.8 F (37.1 C)     Temp Source 03/16/16 1002 Oral     SpO2 03/16/16 1002 100 %     Weight 03/16/16 1003 148 lb (67.1 kg)     Height 03/16/16 1003 5\' 5"  (1.651 m)     Head Circumference --      Peak Flow --      Pain Score --      Pain Loc --      Pain Edu? --      Excl. in [place]? --    No data found.   Updated Vital Signs BP 134/79 (BP Location: Left Arm)   Pulse (!) 56   Temp 98.8 F (37.1 C) (Oral)   Resp 16   Ht 5\' 5"  (1.651 m)   Wt 148 lb (67.1 kg)   SpO2 100%   BMI 24.63 kg/m   Visual Acuity Right Eye Distance:   Left Eye Distance:   Bilateral Distance:    Right Eye Near:   Left Eye Near:    Bilateral Near:     Physical Exam  Constitutional: She is oriented to person, place, and time. She appears well-developed and well-nourished.  HENT:  Head:  Normocephalic and atraumatic.  Eyes: Pupils are equal, round, and reactive to light.  Neck: Normal range of motion.  Pulmonary/Chest: Effort normal.  Abdominal: Soft. Bowel sounds are normal. She exhibits no distension. There is no tenderness. There is no CVA tenderness.  Musculoskeletal: Normal range of motion. She exhibits no deformity.  Neurological: She is alert and oriented to person, place, and time.  Skin: Skin is warm.  Psychiatric: She has a normal mood and affect.  Vitals reviewed.    UC Treatments / Results  Labs (all labs ordered are listed, but only abnormal results are displayed) Labs Reviewed  URINALYSIS COMPLETEWITH MICROSCOPIC ([place]) - Abnormal; Notable for the following:       Result Value   APPearance CLOUDY (*)    Hgb urine dipstick LARGE (*)    Protein, ur 30 (*)    Leukocytes, UA LARGE (*)    Bacteria, UA FEW (*)    All other components within normal limits  URINE CULTURE    EKG  EKG Interpretation None       Radiology No results found.  Procedures Procedures (including critical care time)  Medications Ordered in UC Medications - No data to display  Results for orders placed or performed during the hospital encounter of 03/16/16  Urinalysis complete, with microscopic  Result Value Ref Range   Color, Urine YELLOW YELLOW   APPearance CLOUDY (A) CLEAR   Glucose, UA NEGATIVE NEGATIVE mg/dL   Bilirubin Urine NEGATIVE NEGATIVE   Ketones, ur NEGATIVE NEGATIVE mg/dL   Specific Gravity, Urine 1.020 1.005 - 1.030   Hgb urine dipstick LARGE (A) NEGATIVE   pH 5.5 5.0 - 8.0   Protein, ur 30 (A) NEGATIVE mg/dL   Nitrite NEGATIVE NEGATIVE   Leukocytes, UA LARGE (A) NEGATIVE   RBC / HPF TOO NUMEROUS TO COUNT 0 - 5 RBC/hpf   WBC, UA TOO NUMEROUS TO COUNT 0 - 5 WBC/hpf   Bacteria, UA FEW (A) NONE SEEN   Squamous Epithelial / LPF NONE SEEN NONE SEEN   Initial Impression / Assessment and Plan / UC Course  I have reviewed the triage vital signs  and the nursing notes.  Pertinent labs & imaging results that were available during my care of the patient were reviewed by me and considered in my medical decision making (see chart for details).  Clinical Course   Urine is grossly abnormal. We'll place on Cipro per her request 500 mg 1 tablet a day for 7 days will give a prescription for Pyridium to use if needed. Follow-up with PCP as needed in 1-2 weeks. Culture of urine will be obtained as well.   Final Clinical Impressions(s) / UC Diagnoses   Final diagnoses:  Lower urinary tract infectious disease  Cystitis    New Prescriptions New Prescriptions   CIPROFLOXACIN (CIPRO) 500 MG TABLET    Take 1 tablet (500 mg total) by mouth 2 (two) times daily.   PHENAZOPYRIDINE (PYRIDIUM) 200 MG TABLET    Take 1 tablet (200 mg total) by mouth 3 (three) times daily.    Note: This dictation was prepared with Dragon dictation along with smaller phrase technology. Any transcriptional errors that result from this process are unintentional.   Frederich Cha, MD 03/16/16 1119

## 2016-03-19 ENCOUNTER — Telehealth: Payer: Self-pay | Admitting: Emergency Medicine

## 2016-03-19 LAB — URINE CULTURE

## 2016-03-19 NOTE — Telephone Encounter (Signed)
Patient notified of her urine culture result.  Patient is on Cipro.  Result shows sensitive to Cipro.  Patient states that her symptoms have improve and is feeling much better.  Patient instructed to continue with her Cipro and to follow-up here or with her PCP if her symptoms worsen.

## 2016-09-24 ENCOUNTER — Ambulatory Visit
Admission: EM | Admit: 2016-09-24 | Discharge: 2016-09-24 | Disposition: A | Discharge status: Home / Self Care | Payer: Medicare Other | Attending: Family Medicine | Admitting: Family Medicine

## 2016-09-24 DIAGNOSIS — Z881 Allergy status to other antibiotic agents status: Secondary | ICD-10-CM | POA: Insufficient documentation

## 2016-09-24 DIAGNOSIS — I10 Essential (primary) hypertension: Secondary | ICD-10-CM | POA: Diagnosis not present

## 2016-09-24 DIAGNOSIS — E079 Disorder of thyroid, unspecified: Secondary | ICD-10-CM | POA: Insufficient documentation

## 2016-09-24 DIAGNOSIS — Z880 Allergy status to penicillin: Secondary | ICD-10-CM | POA: Insufficient documentation

## 2016-09-24 DIAGNOSIS — R350 Frequency of micturition: Secondary | ICD-10-CM | POA: Insufficient documentation

## 2016-09-24 DIAGNOSIS — N390 Urinary tract infection, site not specified: Secondary | ICD-10-CM

## 2016-09-24 DIAGNOSIS — R319 Hematuria, unspecified: Secondary | ICD-10-CM

## 2016-09-24 LAB — URINALYSIS, COMPLETE (UACMP) WITH MICROSCOPIC: Squamous Epithelial / LPF: NONE SEEN

## 2016-09-24 MED ORDER — CIPROFLOXACIN HCL 500 MG PO TABS: 500.0000 mg | ORAL_TABLET | Freq: Two times a day (BID) | ORAL | 0 refills | Status: DC

## 2016-09-24 NOTE — ED Triage Notes (Signed)
Patient complains of urinary frequency, burning with urination, hematuria. Patient states that symptoms started in the middle of last night.

## 2016-09-24 NOTE — ED Provider Notes (Signed)
[facility] ____________________________________________  Time seen: Approximately 10:10 AM  I have reviewed the triage vital signs and the nursing notes.   HISTORY  Chief Complaint Urinary Frequency  HPI Tracy Long is a 82 y.o. female presenting for evaluation of urinary frequency, urinary urgency and mild burning with urination started last night. Patient reports that she has had a history of recurrent urinary tract infections with similar presentation. Reports last urinary tract infection was in October 2017, denies other recent antibiotic use. Patient reports that she has multiple antibiotic allergies and only tolerate Cipro for UTIs. Denies any known resistance to antibiotics. Patient denies vaginal problems. Reports did notice some blood in her urine this morning, denies vaginal bleeding. Reports some mild suprapubic pressure and abdomen and achy low back pain. Denies flank pain, fevers, vomiting or diarrhea. Reports otherwise feels well.  Denies chest pain, shortness of breath, or rash. Denies recent sickness. Denies recent antibiotic use.   Ricardo Jericho, NP: PCP   Past Medical History:  Diagnosis Date  . Arthritis   . Cancer ([place])   . Hypertension   . Renal disorder   . Thyroid disease     There are no active problems to display for this patient.   Past Surgical History:  Procedure Laterality Date  . ABDOMINAL HYSTERECTOMY    . BACK SURGERY    . BREAST SURGERY    . EYE SURGERY    . JOINT REPLACEMENT    . SKIN BIOPSY       No current facility-administered medications for this encounter.   Current Outpatient Prescriptions:  .  acetaminophen (TYLENOL) 500 MG tablet, Take 500 mg by mouth every 6 (six) hours as needed., Disp: , Rfl:  .  aspirin EC 81 MG tablet, Take 81 mg by mouth daily., Disp: , Rfl:  .  calcium carbonate (OSCAL) 1500 (600 Ca) MG TABS tablet, Take by mouth 2 (two) times daily with a meal., Disp: , Rfl:  .   cetirizine (ZYRTEC) 10 MG tablet, Take 10 mg by mouth daily., Disp: , Rfl:  .  cholecalciferol (VITAMIN D) 400 units TABS tablet, Take 400 Units by mouth., Disp: , Rfl:  .  clindamycin (CLEOCIN) 150 MG capsule, Take by mouth 3 (three) times daily., Disp: , Rfl:  .  conjugated estrogens (PREMARIN) vaginal cream, Place 1 Applicatorful vaginally daily., Disp: , Rfl:  .  cycloSPORINE (RESTASIS) 0.05 % ophthalmic emulsion, 1 drop 2 (two) times daily., Disp: , Rfl:  .  docusate sodium (COLACE) 100 MG capsule, Take 100 mg by mouth 2 (two) times daily., Disp: , Rfl:  .  fluticasone (VERAMYST) 27.5 MCG/SPRAY nasal spray, Place 2 sprays into the nose daily., Disp: , Rfl:  .  levothyroxine (SYNTHROID, LEVOTHROID) 75 MCG tablet, Take 75 mcg by mouth daily before breakfast., Disp: , Rfl:  .  meloxicam (MOBIC) 15 MG tablet, Take 15 mg by mouth daily., Disp: , Rfl:  .  metoprolol succinate (TOPROL-XL) 25 MG 24 hr tablet, Take 25 mg by mouth daily., Disp: , Rfl:  .  multivitamin-iron-minerals-folic acid (CENTRUM) chewable tablet, Chew 1 tablet by mouth daily., Disp: , Rfl:  .  PARoxetine (PAXIL) 10 MG tablet, Take 10 mg by mouth daily., Disp: , Rfl:  .  PROPYLENE GLYCOL OP, Apply to eye., Disp: , Rfl:  .  ciprofloxacin (CIPRO) 500 MG tablet, Take 1 tablet (500 mg total) by mouth 2 (two) times daily., Disp: 14 tablet, Rfl: 0 .  phenazopyridine (PYRIDIUM) 200 MG  tablet, Take 1 tablet (200 mg total) by mouth 3 (three) times daily., Disp: 15 tablet, Rfl: 0  Allergies Aloe; Bactrim [sulfamethoxazole-trimethoprim]; Cardizem [diltiazem]; Doxycycline; Macrobid [nitrofurantoin]; Penicillins; and Statins  History reviewed. No pertinent family history.  Social History Social History  Substance Use Topics  . Smoking status: Never Smoker  . Smokeless tobacco: Never Used  . Alcohol use No    Review of Systems Constitutional: No fever/chills Cardiovascular: Denies chest pain. Respiratory: Denies shortness of  breath. Gastrointestinal: No abdominal pain.  No nausea, no vomiting.  No diarrhea.  No constipation. Genitourinary: Positive for dysuria. Musculoskeletal: Negative for back pain. Skin: Negative for rash.   ____________________________________________   PHYSICAL EXAM:  VITAL SIGNS: ED Triage Vitals  Enc Vitals Group     BP 09/24/16 0903 (!) 115/58     Pulse Rate 09/24/16 0903 93     Resp 09/24/16 0903 18     Temp 09/24/16 0903 98.5 F (36.9 C)     Temp Source 09/24/16 0903 Oral     SpO2 09/24/16 0903 99 %     Weight 09/24/16 0900 132 lb (59.9 kg)     Height 09/24/16 0900 5\' 5"  (1.651 m)     Head Circumference --      Peak Flow --      Pain Score 09/24/16 0901 4     Pain Loc --      Pain Edu? --      Excl. in [place]? --     Constitutional: Alert and oriented. Well appearing and in no acute distress. Cardiovascular: Normal rate, regular rhythm. Grossly normal heart sounds.  Good peripheral circulation. Respiratory: Normal respiratory effort without tachypnea nor retractions. Breath sounds are clear and equal bilaterally. No wheezes, rales, rhonchi. Gastrointestinal: Soft and nontender.  No CVA tenderness. Musculoskeletal:  No midline cervical, thoracic or lumbar tenderness to palpation.  Neurologic:  Normal speech and language. Speech is normal. No gait instability.  Skin:  Skin is warm, dry Psychiatric: Mood and affect are normal. Speech and behavior are normal. Patient exhibits appropriate insight and judgment   ___________________________________________   LABS (all labs ordered are listed, but only abnormal results are displayed)  Labs Reviewed  URINALYSIS, COMPLETE (UACMP) WITH MICROSCOPIC - Abnormal; Notable for the following:       Result Value   Color, Urine RED (*)    APPearance CLOUDY (*)    Glucose, UA   (*)    Value: TEST NOT REPORTED DUE TO COLOR INTERFERENCE OF URINE PIGMENT   Hgb urine dipstick   (*)    Value: TEST NOT REPORTED DUE TO COLOR INTERFERENCE  OF URINE PIGMENT   Bilirubin Urine   (*)    Value: TEST NOT REPORTED DUE TO COLOR INTERFERENCE OF URINE PIGMENT   Ketones, ur   (*)    Value: TEST NOT REPORTED DUE TO COLOR INTERFERENCE OF URINE PIGMENT   Protein, ur   (*)    Value: TEST NOT REPORTED DUE TO COLOR INTERFERENCE OF URINE PIGMENT   Nitrite   (*)    Value: TEST NOT REPORTED DUE TO COLOR INTERFERENCE OF URINE PIGMENT   Leukocytes, UA   (*)    Value: TEST NOT REPORTED DUE TO COLOR INTERFERENCE OF URINE PIGMENT   Bacteria, UA FEW (*)    All other components within normal limits  URINE CULTURE    PROCEDURES Procedures    INITIAL IMPRESSION / ASSESSMENT AND PLAN / ED COURSE  Pertinent labs & imaging results that  were available during my care of the patient were reviewed by me and considered in my medical decision making (see chart for details).  Well-appearing patient. No acute distress. Presents for evaluation of dysuria. Suspect urinary tract infection. Will culture urine. Patient states multiple antibiotic allergies. Patient unsure if she tolerates cephalosporins. Encouraged patient to investigate to see if patient tolerates cephalosporins, by this time will start patient on oral Cipro as patient reports that she knows she can tolerate this well. Counseled regarding medication side effects including tendon injury. Encouraged rest, fluids and supportive care.Discussed indication, risks and benefits of medications with patient.  Discussed follow up with Primary care physician this week. Discussed follow up and return parameters including no resolution or any worsening concerns. Patient verbalized understanding and agreed to plan.   ____________________________________________   FINAL CLINICAL IMPRESSION(S) / ED DIAGNOSES  Final diagnoses:  Urinary tract infection with hematuria, site unspecified     Discharge Medication List as of 09/24/2016  9:23 AM      Note: This dictation was prepared with Dragon dictation along  with smaller phrase technology. Any transcriptional errors that result from this process are unintentional.         Marylene Land, NP 09/24/16 [phone]

## 2016-09-24 NOTE — Discharge Instructions (Signed)
Take medication as prescribed. Rest. Drink plenty of fluids.  ° °Follow up with your primary care physician this week as needed. Return to Urgent care for new or worsening concerns.  ° °

## 2016-09-25 LAB — URINE CULTURE

## 2017-08-15 ENCOUNTER — Other Ambulatory Visit: Payer: Self-pay | Admitting: Internal Medicine

## 2017-08-15 DIAGNOSIS — Z1231 Encounter for screening mammogram for malignant neoplasm of breast: Secondary | ICD-10-CM

## 2017-08-29 ENCOUNTER — Ambulatory Visit
Admission: RE | Admit: 2017-08-29 | Discharge: 2017-08-29 | Disposition: A | Discharge status: Home / Self Care | Payer: Medicare Other | Source: Ambulatory Visit | Attending: Internal Medicine | Admitting: Internal Medicine

## 2017-08-29 DIAGNOSIS — Z1231 Encounter for screening mammogram for malignant neoplasm of breast: Secondary | ICD-10-CM

## 2017-08-29 HISTORY — DX: Other signs and symptoms in breast: N64.59

## 2018-07-10 ENCOUNTER — Encounter: Payer: Self-pay | Admitting: Emergency Medicine

## 2018-07-10 ENCOUNTER — Other Ambulatory Visit: Payer: Self-pay

## 2018-07-10 ENCOUNTER — Ambulatory Visit
Admission: EM | Admit: 2018-07-10 | Discharge: 2018-07-10 | Disposition: A | Discharge status: Home / Self Care | Payer: Medicare Other | Attending: Family Medicine | Admitting: Family Medicine

## 2018-07-10 ENCOUNTER — Ambulatory Visit: Payer: Medicare Other

## 2018-07-10 DIAGNOSIS — S2242XA Multiple fractures of ribs, left side, initial encounter for closed fracture: Secondary | ICD-10-CM | POA: Insufficient documentation

## 2018-07-10 DIAGNOSIS — W108XXA Fall (on) (from) other stairs and steps, initial encounter: Secondary | ICD-10-CM

## 2018-07-10 DIAGNOSIS — N3001 Acute cystitis with hematuria: Secondary | ICD-10-CM | POA: Insufficient documentation

## 2018-07-10 LAB — URINALYSIS, COMPLETE (UACMP) WITH MICROSCOPIC
BACTERIA UA: NONE SEEN
Glucose, UA: NEGATIVE mg/dL
Nitrite: POSITIVE — AB
PROTEIN: 100 mg/dL — AB
Specific Gravity, Urine: 1.025 (ref 1.005–1.030)
Squamous Epithelial / LPF: NONE SEEN (ref 0–5)
pH: 5 (ref 5.0–8.0)

## 2018-07-10 MED ORDER — CIPROFLOXACIN HCL 250 MG PO TABS: 250.0000 mg | ORAL_TABLET | Freq: Two times a day (BID) | ORAL | 0 refills | Status: DC

## 2018-07-10 NOTE — ED Triage Notes (Signed)
Patient c/o urinary frequency and urgency that started last night.

## 2018-07-10 NOTE — ED Provider Notes (Signed)
[facility]    CSN: [id number] Arrival date & time: 07/10/18  1006     History   Chief Complaint Chief Complaint  Patient presents with  . Urinary Urgency  . Urinary Frequency  . Muscle Pain    HPI Hailynn Slovacek is a 84 y.o. female.   84 yo female with a c/o urinary frequency and urgency since last night. Denies any dysuria, fevers, chills.   Also c/o left rib pain for the past week since falling one week ago while on a trip to [place]. States she fell going down some steps and landed on her left ribs. Denies difficulty breathing.   The history is provided by the patient.    Past Medical History:  Diagnosis Date  . Arthritis   . Cancer ([place])   . Hypertension   . Inverted nipple   . Renal disorder   . Thyroid disease     There are no active problems to display for this patient.   Past Surgical History:  Procedure Laterality Date  . ABDOMINAL HYSTERECTOMY    . BACK SURGERY    . BREAST EXCISIONAL BIOPSY Bilateral    [facility]  . BREAST SURGERY    . EYE SURGERY    . JOINT REPLACEMENT    . SKIN BIOPSY      OB History   No obstetric history on file.      Home Medications    Prior to Admission medications   Medication Sig Start Date End Date Taking? Authorizing Provider  acetaminophen (TYLENOL) 500 MG tablet Take 500 mg by mouth every 6 (six) hours as needed.   Yes [provider]  aspirin EC 81 MG tablet Take 81 mg by mouth daily.   Yes [provider]  calcium carbonate (OSCAL) 1500 (600 Ca) MG TABS tablet Take by mouth 2 (two) times daily with a meal.   Yes [provider]  cetirizine (ZYRTEC) 10 MG tablet Take 10 mg by mouth daily.   Yes [provider]  cholecalciferol (VITAMIN D) 400 units TABS tablet Take 400 Units by mouth.   Yes [provider]  conjugated estrogens (PREMARIN) vaginal cream Place 1 Applicatorful vaginally daily.   Yes [provider]  cycloSPORINE (RESTASIS) 0.05  % ophthalmic emulsion 1 drop 2 (two) times daily.   Yes [provider]  docusate sodium (COLACE) 100 MG capsule Take 100 mg by mouth 2 (two) times daily.   Yes [provider]  fluticasone (VERAMYST) 27.5 MCG/SPRAY nasal spray Place 2 sprays into the nose daily.   Yes [provider]  levothyroxine (SYNTHROID, LEVOTHROID) 75 MCG tablet Take 75 mcg by mouth daily before breakfast.   Yes [provider]  metoprolol succinate (TOPROL-XL) 25 MG 24 hr tablet Take 25 mg by mouth daily.   Yes [provider]  multivitamin-iron-minerals-folic acid (CENTRUM) chewable tablet Chew 1 tablet by mouth daily.   Yes [provider]  PARoxetine (PAXIL) 10 MG tablet Take 10 mg by mouth daily.   Yes [provider]  ciprofloxacin (CIPRO) 250 MG tablet Take 1 tablet (250 mg total) by mouth every 12 (twelve) hours. 07/10/18   Norval Gable, MD  clindamycin (CLEOCIN) 150 MG capsule Take by mouth 3 (three) times daily.    [provider]  meloxicam (MOBIC) 15 MG tablet Take 15 mg by mouth daily.    [provider]  phenazopyridine (PYRIDIUM) 200 MG tablet Take 1 tablet (200 mg total) by mouth 3 (  three) times daily. 03/16/16   Frederich Cha, MD  PROPYLENE GLYCOL OP Apply to eye.    [provider]    Family History Family History  Problem Relation Age of Onset  . Breast cancer Neg Hx     Social History Social History   Tobacco Use  . Smoking status: Never Smoker  . Smokeless tobacco: Never Used  Substance Use Topics  . Alcohol use: No  . Drug use: No     Allergies   Aloe; Bactrim [sulfamethoxazole-trimethoprim]; Cardizem [diltiazem]; Doxycycline; Macrobid [nitrofurantoin]; Penicillins; and Statins   Review of Systems Review of Systems   Physical Exam Triage Vital Signs ED Triage Vitals  Enc Vitals Group     BP 07/10/18 1040 (!) 137/59     Pulse Rate 07/10/18 1040 77     Resp 07/10/18 1040 18     Temp  07/10/18 1040 98.2 F (36.8 C)     Temp Source 07/10/18 1040 Oral     SpO2 07/10/18 1040 99 %     Weight 07/10/18 1038 140 lb (63.5 kg)     Height 07/10/18 1038 5\' 5"  (1.651 m)     Head Circumference --      Peak Flow --      Pain Score 07/10/18 1038 8     Pain Loc --      Pain Edu? --      Excl. in [place]? --    No data found.  Updated Vital Signs BP (!) 137/59 (BP Location: Right Arm)   Pulse 77   Temp 98.2 F (36.8 C) (Oral)   Resp 18   Ht 5\' 5"  (1.651 m)   Wt 63.5 kg   SpO2 99%   BMI 23.30 kg/m   Visual Acuity Right Eye Distance:   Left Eye Distance:   Bilateral Distance:    Right Eye Near:   Left Eye Near:    Bilateral Near:     Physical Exam Vitals signs and nursing note reviewed.  Constitutional:      General: She is not in acute distress.    Appearance: She is not toxic-appearing or diaphoretic.  Pulmonary:     Effort: Pulmonary effort is normal. No respiratory distress.     Breath sounds: Normal breath sounds. No stridor. No wheezing, rhonchi or rales.  Chest:     Chest wall: Tenderness (left lower ribs; ecchymosis noted as well) present.  Abdominal:     General: Bowel sounds are normal. There is no distension.     Palpations: Abdomen is soft. There is no mass.     Tenderness: There is no abdominal tenderness. There is no right CVA tenderness, left CVA tenderness, guarding or rebound.     Hernia: No hernia is present.  Neurological:     Mental Status: She is alert.      UC Treatments / Results  Labs (all labs ordered are listed, but only abnormal results are displayed) Labs Reviewed  URINALYSIS, COMPLETE (UACMP) WITH MICROSCOPIC - Abnormal; Notable for the following components:      Result Value   APPearance HAZY (*)    Hgb urine dipstick LARGE (*)    Bilirubin Urine SMALL (*)    Ketones, ur TRACE (*)    Protein, ur 100 (*)    Nitrite POSITIVE (*)    Leukocytes,Ua MODERATE (*)    All other components within normal limits  URINE CULTURE     EKG None  Radiology Dg Ribs Unilateral W/chest Left  Result Date: 07/10/2018 CLINICAL DATA:  Pain following recent fall EXAM: LEFT RIBS AND CHEST - 3+ VIEW COMPARISON:  Chest radiograph July 31, 2014 FINDINGS: Frontal chest as well as oblique and cone-down rib images were obtained. Lungs are clear. Heart size and pulmonary vascularity are normal. No adenopathy. There is no appreciable pleural effusion or pneumothorax. There are slightly displaced fractures of the anterior left ninth, tenth, and eleventh ribs. No other acute fractures are evident. The patient is status post kyphoplasty procedures at L1 and L2. There is aortic atherosclerosis. IMPRESSION: Mildly displaced fractures of the anterior left ninth, tenth, and eleventh ribs. No evident pneumothorax. No edema or consolidation. There have been kyphoplasty procedures at L1 and L2. Aortic Atherosclerosis (ICD10-I70.0). These results will be called to the ordering clinician or representative by the Radiologist Assistant, and communication documented in the PACS or zVision Dashboard. Electronically Signed   By: Lowella Grip III M.D.   On: 07/10/2018 11:32    Procedures Procedures (including critical care time)  Medications Ordered in UC Medications - No data to display  Initial Impression / Assessment and Plan / UC Course  I have reviewed the triage vital signs and the nursing notes.  Pertinent labs & imaging results that were available during my care of the patient were reviewed by me and considered in my medical decision making (see chart for details).      Final Clinical Impressions(s) / UC Diagnoses   Final diagnoses:  Closed fracture of multiple ribs of left side, initial encounter  Acute cystitis with hematuria   Discharge Instructions   None    ED Prescriptions    Medication Sig Dispense Auth. Provider   ciprofloxacin (CIPRO) 250 MG tablet Take 1 tablet (250 mg total) by mouth every 12 (twelve) hours. 10 tablet  Norval Gable, MD     1. Labs/x-ray results and diagnosis reviewed with patient 2. rx as per orders above; reviewed possible side effects, interactions, risks and benefits; refuses pain medication; states will continue with over the counter tylenol for rib pain 3. Recommend supportive treatment with rest, ice, increased fluids 4. Follow-up prn if symptoms worsen or don't improve   Controlled Substance Prescriptions [place] Controlled Substance Registry consulted? Not Applicable   Norval Gable, MD 07/10/18 1255

## 2018-07-10 NOTE — ED Triage Notes (Signed)
Patient also states she fell 5 days ago and is having left side rib pain. She is concerned that she may have fractured her rib.

## 2018-07-13 ENCOUNTER — Telehealth (HOSPITAL_COMMUNITY): Payer: Self-pay | Admitting: Emergency Medicine

## 2018-07-13 LAB — URINE CULTURE: Culture: >=100000 — AB

## 2018-07-13 MED ORDER — CEPHALEXIN 500 MG PO CAPS: 500.0000 mg | ORAL_CAPSULE | Freq: Two times a day (BID) | ORAL | 0 refills | Status: AC

## 2018-07-13 NOTE — Telephone Encounter (Signed)
Urine culture was positive for e coli resistant to cipro given at urgent care visit. Prescription for keflex per Dr. Meda Coffee sent to pharmacy of choice. Pt called and made aware. Pt educated to follow up if symptoms are not improving. Verbalized understanding.

## 2019-10-19 ENCOUNTER — Encounter: Payer: Self-pay | Admitting: Emergency Medicine

## 2019-10-19 ENCOUNTER — Ambulatory Visit (INDEPENDENT_AMBULATORY_CARE_PROVIDER_SITE_OTHER): Payer: Medicare Other

## 2019-10-19 ENCOUNTER — Other Ambulatory Visit: Payer: Self-pay

## 2019-10-19 ENCOUNTER — Ambulatory Visit
Admission: EM | Admit: 2019-10-19 | Discharge: 2019-10-19 | Disposition: A | Discharge status: Home / Self Care | Payer: Medicare Other | Attending: Family Medicine | Admitting: Family Medicine

## 2019-10-19 DIAGNOSIS — W19XXXA Unspecified fall, initial encounter: Secondary | ICD-10-CM

## 2019-10-19 DIAGNOSIS — S20211A Contusion of right front wall of thorax, initial encounter: Secondary | ICD-10-CM

## 2019-10-19 NOTE — ED Provider Notes (Signed)
[facility] ____________________________________________  Time seen: Approximately 1:15 PM  I have reviewed the triage vital signs and the nursing notes.   HISTORY  Chief Complaint Rib Pain and Fall   HPI Tracy Long is a 85 y.o. female presenting for evaluation of right rib pain.  Patient reports 5 days ago on Sunday she was at her granddaughter's house in [place] and was going down steps.  Patient states they were 2 steps, the first when she stepped down but then missed the second 1 causing her to lose her balance and fall.  States she believes she hit her right ribs on the chair next to the table.  States she was able to still put her back in her cup on the table while falling.  Reports she has had pain to her right ribs since the injury.  States the pain is better than initially.  Has been taken Tylenol intermittently.  States has been also using Salonpas topical patches which have helped, but reports she pulled 1 off and it was still very stuck and irritated her skin.  Denies chest pain, shortness of breath, abdominal pain.  States when she fell she did hit the back of her head but states she is doing well from that.  Denies loss of consciousness, headache, vision changes, paresthesias, unilateral weakness, confusion or vision changes.  Reports otherwise doing well.  States that she has a history of rib fractures and wanted to make sure she did not break her at this time.   Past Medical History:  Diagnosis Date   Arthritis    Cancer ([place])    Hypertension    Inverted nipple    Renal disorder    Thyroid disease     There are no problems to display for this patient.   Past Surgical History:  Procedure Laterality Date   ABDOMINAL HYSTERECTOMY     BACK SURGERY     BREAST EXCISIONAL BIOPSY Bilateral    [facility]   BREAST SURGERY     EYE SURGERY     JOINT REPLACEMENT     SKIN BIOPSY       No current facility-administered medications for this  encounter.  Current Outpatient Medications:    aspirin EC 81 MG tablet, Take 81 mg by mouth daily., Disp: , Rfl:    calcium carbonate (OSCAL) 1500 (600 Ca) MG TABS tablet, Take by mouth 2 (two) times daily with a meal., Disp: , Rfl:    cetirizine (ZYRTEC) 10 MG tablet, Take 10 mg by mouth daily., Disp: , Rfl:    cholecalciferol (VITAMIN D) 400 units TABS tablet, Take 400 Units by mouth., Disp: , Rfl:    conjugated estrogens (PREMARIN) vaginal cream, Place 1 Applicatorful vaginally daily., Disp: , Rfl:    cycloSPORINE (RESTASIS) 0.05 % ophthalmic emulsion, 1 drop 2 (two) times daily., Disp: , Rfl:    fluticasone (VERAMYST) 27.5 MCG/SPRAY nasal spray, Place 2 sprays into the nose daily., Disp: , Rfl:    levothyroxine (SYNTHROID, LEVOTHROID) 75 MCG tablet, Take 75 mcg by mouth daily before breakfast., Disp: , Rfl:    meloxicam (MOBIC) 15 MG tablet, Take 15 mg by mouth daily., Disp: , Rfl:    metoprolol succinate (TOPROL-XL) 25 MG 24 hr tablet, Take 25 mg by mouth daily., Disp: , Rfl:    multivitamin-iron-minerals-folic acid (CENTRUM) chewable tablet, Chew 1 tablet by mouth daily., Disp: , Rfl:    PARoxetine (PAXIL) 10 MG tablet, Take 10 mg by mouth daily., Disp: , Rfl:  acetaminophen (TYLENOL) 500 MG tablet, Take 500 mg by mouth every 6 (six) hours as needed., Disp: , Rfl:    docusate sodium (COLACE) 100 MG capsule, Take 100 mg by mouth 2 (two) times daily., Disp: , Rfl:    PROPYLENE GLYCOL OP, Apply to eye., Disp: , Rfl:   Allergies Aloe, Bactrim [sulfamethoxazole-trimethoprim], Cardizem [diltiazem], Doxycycline, Macrobid [nitrofurantoin], Penicillins, and Statins  Family History  Problem Relation Age of Onset   Breast cancer Neg Hx     Social History Social History   Tobacco Use   Smoking status: Never Smoker   Smokeless tobacco: Never Used  Substance Use Topics   Alcohol use: No   Drug use: No    Review of Systems Constitutional: No fever/chills Eyes: No  visual changes. ENT: No sore throat. Cardiovascular: Denies chest pain. Respiratory: Denies shortness of breath. Gastrointestinal: No abdominal pain.  No nausea, no vomiting.  No diarrhea.  Genitourinary: Negative for dysuria. Musculoskeletal: Negative for back pain.  Positive right rib pain. Skin: Negative for rash. Neurological: Negative for headaches, focal weakness or numbness.    ____________________________________________   PHYSICAL EXAM:  VITAL SIGNS: ED Triage Vitals  Enc Vitals Group     BP 10/19/19 1111 119/65     Pulse Rate 10/19/19 1111 68     Resp 10/19/19 1111 14     Temp 10/19/19 1111 98.6 F (37 C)     Temp Source 10/19/19 1111 Oral     SpO2 10/19/19 1111 98 %     Weight 10/19/19 1107 135 lb (61.2 kg)     Height 10/19/19 1107 5\' 5"  (1.651 m)     Head Circumference --      Peak Flow --      Pain Score 10/19/19 1106 5     Pain Loc --      Pain Edu? --      Excl. in [place]? --     Constitutional: Alert and oriented. Well appearing and in no acute distress. Eyes: Conjunctivae are normal. PERRL.  ENT      Head: Normocephalic and atraumatic. Cardiovascular: Normal rate, regular rhythm. Grossly normal heart sounds.  Good peripheral circulation. Respiratory: Normal respiratory effort without tachypnea nor retractions. Breath sounds are clear and equal bilaterally. No wheezes, rales, rhonchi. Gastrointestinal: Soft and nontender. No distention. No CVA tenderness. Musculoskeletal: No midline cervical, thoracic or lumbar tenderness to palpation.  Except: Right lateral lower rib tenderness to direct palpation, no swelling, no ecchymosis, mild erythema in rectangular shape from salon patch, no further surrounding erythema, no drainage, no abdominal tenderness. Neurologic:  Normal speech and language. No gross focal neurologic deficits are appreciated. Speech is normal. No gait instability. No paresthesias.  Skin:  Skin is warm, dry and intact. No rash  noted. Psychiatric: Mood and affect are normal. Speech and behavior are normal. Patient exhibits appropriate insight and judgment   ___________________________________________   LABS (all labs ordered are listed, but only abnormal results are displayed)  Labs Reviewed - No data to display  RADIOLOGY  DG Ribs Unilateral W/Chest Right  Result Date: 10/19/2019 CLINICAL DATA:  Pain following recent fall EXAM: RIGHT RIBS AND CHEST - 3+ VIEW COMPARISON:  July 10, 2018 FINDINGS: Frontal chest as well as oblique and cone-down rib images obtained. The lungs are clear. The heart size and pulmonary vascularity are normal. Aorta is mildly tortuous but stable. No adenopathy. Status post kyphoplasty procedures at L1 and L2. Scoliosis is stable. Previously noted left rib fractures not well seen  currently. No pneumothorax or pleural effusion. No acute fracture evident. Surgical clips noted in right breast region. IMPRESSION: No acute fracture evident. Status post kyphoplasty procedures at L1 and L2. There is scoliosis, stable. No edema or airspace opacity. Stable cardiac silhouette. Electronically Signed   By: Lowella Grip III M.D.   On: 10/19/2019 11:55   ____________________________________________   PROCEDURES Procedures    INITIAL IMPRESSION / ASSESSMENT AND PLAN / ED COURSE  Pertinent labs & imaging results that were available during my care of the patient were reviewed by me and considered in my medical decision making (see chart for details).  Well-appearing patient.  No acute distress.  Fall 5 days ago after missing a step, mechanical fall.  Denies other continued pain.  Patient did hit her head, no loss of consciousness, no focal neurological deficits, not on anticoagulants, no headache and no other changes.  Right rib x-ray as above radiologist, no acute fracture.  Suspect rib contusion.  Encourage continued supportive care, ice and monitoring.  Discussed follow up with Primary care  physician this week. Discussed follow up and return parameters including no resolution or any worsening concerns. Patient verbalized understanding and agreed to plan.   ____________________________________________   FINAL CLINICAL IMPRESSION(S) / ED DIAGNOSES  Final diagnoses:  Rib contusion, right, initial encounter  Fall, initial encounter     ED Discharge Orders    None       Note: This dictation was prepared with Dragon dictation along with smaller phrase technology. Any transcriptional errors that result from this process are unintentional.         Marylene Land, NP 10/19/19 1330

## 2019-10-19 NOTE — ED Triage Notes (Signed)
Patient states that she fell down some steps on Sunday.  Patient c/o right sided rib pain. Patient states that she hit the back o her head also.  Patient denies LOC.

## 2019-10-19 NOTE — Discharge Instructions (Addendum)
Continue Tylenol as needed.  Ice.  Deep breaths throughout the day.  Monitor.  Follow up with your primary care physician this week as needed. Return to Urgent care for new or worsening concerns.

## 2019-10-26 ENCOUNTER — Other Ambulatory Visit: Payer: Self-pay | Admitting: Internal Medicine

## 2019-10-26 DIAGNOSIS — Z1231 Encounter for screening mammogram for malignant neoplasm of breast: Secondary | ICD-10-CM

## 2019-11-06 ENCOUNTER — Ambulatory Visit
Admission: RE | Admit: 2019-11-06 | Discharge: 2019-11-06 | Disposition: A | Discharge status: Home / Self Care | Payer: Medicare Other | Source: Ambulatory Visit | Attending: Internal Medicine | Admitting: Internal Medicine

## 2019-11-06 DIAGNOSIS — Z1231 Encounter for screening mammogram for malignant neoplasm of breast: Secondary | ICD-10-CM | POA: Diagnosis not present

## 2019-12-10 ENCOUNTER — Ambulatory Visit
Admission: EM | Admit: 2019-12-10 | Discharge: 2019-12-10 | Disposition: A | Discharge status: Home / Self Care | Payer: Medicare Other | Attending: Family Medicine | Admitting: Family Medicine

## 2019-12-10 ENCOUNTER — Encounter: Payer: Self-pay | Admitting: Emergency Medicine

## 2019-12-10 ENCOUNTER — Other Ambulatory Visit: Payer: Self-pay

## 2019-12-10 DIAGNOSIS — H5789 Other specified disorders of eye and adnexa: Secondary | ICD-10-CM

## 2019-12-10 NOTE — ED Provider Notes (Signed)
[facility]    CSN: [id number] Arrival date & time: 12/10/19  1053      History   Chief Complaint Chief Complaint  Patient presents with  . Eye Problem    HPI Donald Jacque is a 85 y.o. female.   85 yo female with a c/o eye irritation for the past 3 days. States she was using an eyelid cream and accidentally rubbed some in her eyes. Eyes have been red and irritated since then. Denies any drainage, fevers, chills. Normally uses eye drops for dry eyes.      Past Medical History:  Diagnosis Date  . Arthritis   . Cancer ([place])   . Hypertension   . Inverted nipple   . Renal disorder   . Thyroid disease     There are no problems to display for this patient.   Past Surgical History:  Procedure Laterality Date  . ABDOMINAL HYSTERECTOMY    . BACK SURGERY    . BREAST EXCISIONAL BIOPSY Bilateral    [facility]  . BREAST SURGERY    . EYE SURGERY    . JOINT REPLACEMENT    . SKIN BIOPSY      OB History   No obstetric history on file.      Home Medications    Prior to Admission medications   Medication Sig Start Date End Date Taking? Authorizing Provider  acetaminophen (TYLENOL) 500 MG tablet Take 500 mg by mouth every 6 (six) hours as needed.   Yes [provider]  aspirin EC 81 MG tablet Take 81 mg by mouth daily.   Yes [provider]  calcium carbonate (OSCAL) 1500 (600 Ca) MG TABS tablet Take by mouth 2 (two) times daily with a meal.   Yes [provider]  cetirizine (ZYRTEC) 10 MG tablet Take 10 mg by mouth daily.   Yes [provider]  cholecalciferol (VITAMIN D) 400 units TABS tablet Take 400 Units by mouth.   Yes [provider]  conjugated estrogens (PREMARIN) vaginal cream Place 1 Applicatorful vaginally daily.   Yes [provider]  cycloSPORINE (RESTASIS) 0.05 % ophthalmic emulsion 1 drop 2 (two) times daily.   Yes [provider]  docusate sodium (COLACE) 100 MG capsule Take 100  mg by mouth 2 (two) times daily.   Yes [provider]  fluticasone (VERAMYST) 27.5 MCG/SPRAY nasal spray Place 2 sprays into the nose daily.   Yes [provider]  levothyroxine (SYNTHROID, LEVOTHROID) 75 MCG tablet Take 75 mcg by mouth daily before breakfast.   Yes [provider]  meloxicam (MOBIC) 15 MG tablet Take 15 mg by mouth daily.   Yes [provider]  metoprolol succinate (TOPROL-XL) 25 MG 24 hr tablet Take 25 mg by mouth daily.   Yes [provider]  multivitamin-iron-minerals-folic acid (CENTRUM) chewable tablet Chew 1 tablet by mouth daily.   Yes [provider]  PARoxetine (PAXIL) 10 MG tablet Take 10 mg by mouth daily.   Yes [provider]  PROPYLENE GLYCOL OP Apply to eye.   Yes [provider]    Family History Family History  Problem Relation Age of Onset  . Breast cancer Neg Hx     Social History Social History   Tobacco Use  . Smoking status: Never Smoker  . Smokeless tobacco: Never Used  Vaping Use  . Vaping Use: Never used  Substance Use Topics  . Alcohol use: No  . Drug use: No  Allergies   Aloe, Bactrim [sulfamethoxazole-trimethoprim], Cardizem [diltiazem], Doxycycline, Macrobid [nitrofurantoin], Penicillins, and Statins   Review of Systems Review of Systems   Physical Exam Triage Vital Signs ED Triage Vitals [12/10/19 1142]  Enc Vitals Group     BP (!) 133/74     Pulse Rate 67     Resp 18     Temp 98 F (36.7 C)     Temp Source Oral     SpO2 100 %     Weight 135 lb (61.2 kg)     Height 5\' 5"  (1.651 m)     Head Circumference      Peak Flow      Pain Score 0     Pain Loc      Pain Edu?      Excl. in [place]?    No data found.  Updated Vital Signs BP (!) 133/74 (BP Location: Right Arm)   Pulse 67   Temp 98 F (36.7 C) (Oral)   Resp 18   Ht 5\' 5"  (1.651 m)   Wt 61.2 kg   SpO2 100%   BMI 22.47 kg/m   Visual Acuity Right Eye Distance:   Left Eye  Distance:   Bilateral Distance:    Right Eye Near:   Left Eye Near:    Bilateral Near:     Physical Exam Vitals and nursing note reviewed.  Constitutional:      General: She is not in acute distress.    Appearance: She is not toxic-appearing or diaphoretic.  Eyes:     General: Lids are normal.     Extraocular Movements: Extraocular movements intact.     Conjunctiva/sclera:     Right eye: Right conjunctiva is injected (mildly). No exudate or hemorrhage.    Left eye: Left conjunctiva is injected (mildly). No exudate or hemorrhage.    Pupils: Pupils are equal, round, and reactive to light.  Neurological:     Mental Status: She is alert.      UC Treatments / Results  Labs (all labs ordered are listed, but only abnormal results are displayed) Labs Reviewed - No data to display  EKG   Radiology No results found.  Procedures Procedures (including critical care time)  Medications Ordered in UC Medications - No data to display  Initial Impression / Assessment and Plan / UC Course  I have reviewed the triage vital signs and the nursing notes.  Pertinent labs & imaging results that were available during my care of the patient were reviewed by me and considered in my medical decision making (see chart for details).      Final Clinical Impressions(s) / UC Diagnoses   Final diagnoses:  Irritation of both eyes     Discharge Instructions     Continue current eye drops Flush eyes with cold water/cool compresses    ED Prescriptions    None      1.diagnosis reviewed with patient 2. Recommend supportive treatment as above 3. Follow-up prn if symptoms worsen or don't improve   PDMP not reviewed this encounter.   Norval Gable, MD 12/10/19 2131

## 2019-12-10 NOTE — Discharge Instructions (Signed)
Continue current eye drops Flush eyes with cold water/cool compresses

## 2019-12-10 NOTE — ED Triage Notes (Signed)
Patient states she put a new cream on her left eye on Saturday night. She states she woke up Sunday morning and her eye was red. She used Systane and Restasis eye drops. She reports both eyes are dry and irritated.

## 2020-06-18 ENCOUNTER — Telehealth: Payer: Self-pay | Admitting: General Practice

## 2020-06-18 ENCOUNTER — Ambulatory Visit: Payer: Medicare Other | Admitting: Physical Therapy

## 2020-06-18 NOTE — Telephone Encounter (Signed)
Called patient regarding no show for eval - no answer

## 2020-06-25 ENCOUNTER — Other Ambulatory Visit: Payer: Self-pay

## 2020-06-25 ENCOUNTER — Ambulatory Visit: Payer: Medicare Other | Attending: Internal Medicine | Admitting: Physical Therapy

## 2020-06-25 ENCOUNTER — Ambulatory Visit: Payer: Medicare Other | Admitting: Physical Therapy

## 2020-06-25 ENCOUNTER — Encounter: Payer: Medicare Other | Admitting: Physical Therapy

## 2020-06-25 ENCOUNTER — Encounter: Payer: Self-pay | Admitting: Physical Therapy

## 2020-06-25 DIAGNOSIS — M25561 Pain in right knee: Secondary | ICD-10-CM | POA: Diagnosis present

## 2020-06-25 DIAGNOSIS — G8929 Other chronic pain: Secondary | ICD-10-CM | POA: Diagnosis present

## 2020-06-25 DIAGNOSIS — R262 Difficulty in walking, not elsewhere classified: Secondary | ICD-10-CM | POA: Insufficient documentation

## 2020-06-25 DIAGNOSIS — M6281 Muscle weakness (generalized): Secondary | ICD-10-CM | POA: Insufficient documentation

## 2020-06-25 DIAGNOSIS — M545 Low back pain, unspecified: Secondary | ICD-10-CM | POA: Insufficient documentation

## 2020-06-25 DIAGNOSIS — R2681 Unsteadiness on feet: Secondary | ICD-10-CM | POA: Insufficient documentation

## 2020-06-25 DIAGNOSIS — R296 Repeated falls: Secondary | ICD-10-CM | POA: Diagnosis present

## 2020-06-25 NOTE — Therapy (Signed)
[facility] [address] [place], [address] Phone: [phone]   Fax:  [phone]  Physical Therapy Evaluation  Patient Details  Name: Tracy Long MRN: [id number] Date of Birth: 03/31/1935 Referring Provider (PT): Velna Ochs, MD   Encounter Date: 06/25/2020   PT End of Session - 06/25/20 2029    Visit Number 1    Number of Visits 24    Date for PT Re-Evaluation 09/17/20    Authorization Type Medicare reporting period from 06/25/2020    Progress Note Due on Visit 10    PT Start Time 1345    PT Stop Time 1430    PT Time Calculation (min) 45 min    Equipment Utilized During Treatment Gait belt    Activity Tolerance Patient tolerated treatment well    Behavior During Therapy [facility] for tasks assessed/performed           Past Medical History:  Diagnosis Date  . Arthritis   . Cancer ([place])   . Hypertension   . Inverted nipple   . Renal disorder   . Thyroid disease     Past Surgical History:  Procedure Laterality Date  . ABDOMINAL HYSTERECTOMY    . BACK SURGERY    . BREAST EXCISIONAL BIOPSY Bilateral    [facility]  . BREAST SURGERY    . EYE SURGERY    . JOINT REPLACEMENT    . SKIN BIOPSY      There were no vitals filed for this visit.    Subjective Assessment - 06/25/20 2024    Subjective Patient reports she is feeling wobbly and unbalance and has had 4 falls since Christmas. She has been told before to use a cane or a stick but she hasn't until recently she has been using a cane in her home and in her backyard that has uneven ground. Her right knee is also bothering her and feels this is contributing to difficulty walking. When she goes to get up all of the "bones are wobbly" in her right knee. She lives alone with her dog. She just got herself a foot bicycle that she is working at using but has not gotten in a routine. She states her doctor changed her BP medication because her doctor  said her BP drops when she stands up. She feels like it is making her too tired. She has noticed herself getting unsteady in the last three months. States when she looks up she feels like she is going to keep going. If she bends over she starts to stagger forward until she can stand up. First fall she had gotten on a step stool and was going to get a class of [place] cabinet and all of a sudden the steps felt wobbly and she went over backwards. One fall was on the snow and all of a sudden she was sitting on the snow/ice. One time she was chasing her dog around the table and her heel slipped out of the back of her shoe and she hit a chair. Last fall she went to turn to pick her dog up from the ground and went face first into the driveway. Her face is still not cleared up. Once did not pick foot up high enough on steps and caught her shoe. States in 2007 she had giant cell arteritis that affected her R low back. It was corrected, but she still gets the feeling it is back sometimes she gets up in  the morning and makes her feel like it is back again. She has not spoken with her doctor about this. She starts to fix breakfast and has to sit because of back pain. This has happened for a long time and is not new. It feels like pressure. Hobbies: quilt, church, visitation. Former Marine scientist. Has some limitations form chronic R knee pain and chronic low back pain.    Pertinent History Patient is a 86 y.o. female who presents to outpatient physical therapy with a referral for medical diagnosis unsteady gait. This patient's chief complaints consist of feeling wobbly and unbalanced with repeated falling with injuries and weakness leading to the following functional deficits: difficulty with safe household and community ambulation, ADLs, IADLs, community/social participation, etc. She also complains of some R knee pain and back pain.   Relevant past medical history and comorbidities include arthritis, breast cancer (resolved with  partial mastectomy > 5 years ago), hypertension, renal disorder, thyroid disease, hx hysterectomy, back surgery (does not remember a surgery), joint replacement (left knee), eye biopsy, heart arrhythmia, except cardiac ablation for arhythmia), leaky bladder, stents in B femoral arteries, tibial tendon problem in left ankle, vertebral fracture from fall in 2021, R knee pain, low back pain. Patient denies hx of stroke, seizures, lung problem, diabetes, unexplained weight loss, changes in bowel or bladder problems.    Limitations Lifting;Standing;Walking;House hold activities   difficulty with safe household and community ambulation, ADLs, IADLs, community/social participation, etc   Patient Stated Goals improve balance    Currently in Pain? Yes    Pain Location --   chornic R knee and low back pain that is not her main concern             [facility] PT Assessment - 06/25/20 0001      Assessment   Medical Diagnosis unsteady gait    Referring Provider (PT) Velna Ochs, MD    Next MD Visit next few months    Prior Therapy none for this problem prior to current episode of care      Precautions   Precautions Fall      Balance Screen   Has the patient fallen in the past 6 months Yes    How many times? 4 falls since christmas    Has the patient had a decrease in activity level because of a fear of falling?  No    Is the patient reluctant to leave their home because of a fear of falling?  No      Home Social worker Private residence    [place] to enter    Entrance Stairs-Number of Steps 2    Entrance Stairs-Rails Can reach both;Left;Right    [place] One level    [place] - single point;Walker - 2 wheels;Bedside commode;Grab bars - toilet;Grab bars - tub/shower      Prior Function   Level of Independence Independent with basic ADLs;Independent with household mobility without device;Independent with community  mobility without device;Needs assistance with homemaking   daugher helps with floors     Cognition   Overall Cognitive Status Within Functional Limits for tasks assessed      Observation/Other Assessments   Focus on Therapeutic Outcomes (FOTO)  51      Functional Gait  Assessment   Gait Level Surface Walks 20 ft, slow speed, abnormal gait pattern, evidence for imbalance or deviates 10-15 in outside of the 12 in walkway width.  Requires more than 7 sec to ambulate 20 ft.   8 seconds   Change in Gait Speed Able to change speed, demonstrates mild gait deviations, deviates 6-10 in outside of the 12 in walkway width, or no gait deviations, unable to achieve a major change in velocity, or uses a change in velocity, or uses an assistive device.    Gait with Horizontal Head Turns Performs head turns smoothly with slight change in gait velocity (eg, minor disruption to smooth gait path), deviates 6-10 in outside 12 in walkway width, or uses an assistive device.   notes spinnig   Gait with Vertical Head Turns Performs task with moderate change in gait velocity, slows down, deviates 10-15 in outside 12 in walkway width but recovers, can continue to walk.   light headed   Gait and Pivot Turn Pivot turns safely in greater than 3 sec and stops with no loss of balance, or pivot turns safely within 3 sec and stops with mild imbalance, requires small steps to catch balance.    Step Over Obstacle Is able to step over 2 stacked shoe boxes taped together (9 in total height) without changing gait speed. No evidence of imbalance.    Gait with Narrow Base of Support Ambulates less than 4 steps heel to toe or cannot perform without assistance.    Gait with Eyes Closed Walks 20 ft, slow speed, abnormal gait pattern, evidence for imbalance, deviates 10-15 in outside 12 in walkway width. Requires more than 9 sec to ambulate 20 ft.    Ambulating Backwards Walks 20 ft, slow speed, abnormal gait pattern, evidence for imbalance,  deviates 10-15 in outside 12 in walkway width.    Steps Alternating feet, must use rail.    Total Score 15            OBJECTIVE  OBSERVATION/INSPECTION . Posture: mild bilateral genuvalgus R > L . Tremor: none . Muscle bulk: decreased throughout body . Bed mobility: deferred . Transfers: sit <> stand mod I with BUE use and increased time with guarding of R knee and low back . Gait: antalgig gait favoring R LE, slow . Stairs: completed 4 steps with step over step gait and BUE support pulling with UEs to hoist herself up.    NEUROLOGICAL Dermatomes . L2-S2 appears equal and intact to light touch. Myotomes . L2-S2 appears intact   PERIPHERAL JOINT MOTION (in degrees)  Active Range of Motion (AROM) Comments: B LE appears grossly WFL for basic mobility with some pain with R knee flexion.    MUSCLE PERFORMANCE (MMT):  *Indicates pain 06/25/20 Date Date  Joint/Motion R/L R/L R/L  Hip     Flexion 4/4+ / /  Extension (knee ext, standing) 4/4 / /  Abduction (standing) 4/4 / /  Knee     Extension 5/5 / /  Flexion 4/4 / /  Comments: ankles/great toe extension ~ 4/5 all direcitons, difficulty heel and toe walking with BUE support.   FUNCTIONAL/BALANCE TESTS: Five Time Sit to Stand (5TSTS): 32 seconds from 18.5 inch plinth with B UE support. Notes R knee pain at end.  Functional Gait Assessment (FGA): 15/30 (see details above, high fall risk) Ten meter walking trial (10MWT): 0.75 meters/second  EDUCATION/COGNITION: Patient is alert and oriented X 4.  Objective measurements completed on examination: See above findings.      PT Education - 06/25/20 2028    Education Details exam purpose/form. Education on diagnosis, prognosis, POC, anatomy and physiology of current condition. need to  use AD    Person(s) Educated Patient    Methods Explanation;Demonstration;Tactile cues;Verbal cues    Comprehension Verbalized understanding;Returned demonstration;Need further instruction             PT Short Term Goals - 06/25/20 2003      PT SHORT TERM GOAL #1   Title Be independent with initial home exercise program for self-management of symptoms.    Baseline Initial HEP to be established at visit 2 as appropriate (06/25/2020);    Time 3    Period Weeks    Status New    Target Date 07/16/20             PT Long Term Goals - 06/25/20 2002      PT LONG TERM GOAL #1   Title Be independent with a long-term home exercise program for self-management of symptoms.    Baseline Initial HEP to be established at visit 2 as appropriate (06/25/2020);    Time 12    Period Weeks    Status New   TARGET DATE FOR ALL LONG TERM GOALS: 09/17/2020     PT LONG TERM GOAL #2   Title Demonstrate improved FOTO score to equal or greater than 56 to demonstrate improvement in overall condition and self-reported functional ability.    Baseline 51 (06/25/2020);    Time 12    Period Weeks    Status New      PT LONG TERM GOAL #3   Title Patient will complete 5 Time Sit to Stand test in equal or less than 15 seconds with no UE support from 18.5 inch surface or lower to demonstrate decreased fall risk and improved functional strength for transfers and mobility.    Baseline 32 seconds from 18.5 inch plinth with B UE support (06/25/2020);    Time 12    Period Weeks    Status New      PT LONG TERM GOAL #4   Title Patient will improve score on Functional Gait Assessment to equal or greater than 25/30 to demonstrate reduced fall risk to low fall risk to improve her safety during ADLs, IADLs, and ambulation.    Baseline 15/30 (06/25/2020);    Time 12    Period Weeks    Status New      PT LONG TERM GOAL #5   Title Patient wil demonstrate walking speed of equal or greater than 1.2 meters per second to demontsrate improved community mobility and ability to cross the street safely    Baseline 0.75 meters/second    Time 12    Period Weeks    Status New                  Plan - 06/25/20 2040     Clinical Impression Statement Patient is a 86 y.o. female referred to outpatient physical therapy with a medical diagnosis of unsteady gait who presents with signs and symptoms consistent with unsteadiness on feet with history of multiple falls with injury and generalized weakness. Also demonstrates R knee and low back pain that appears chronic and spinning sensation with head movements that suggests vestibular involvement. She scored 15/30 on the Functional Gait Assessment which is high fall risk, required use of BUE support and 32 seconds for Five Time Sit To Stand test (> 15 seconds is increased fall risk), and ambulated at 0.75 m/s which is below that needed for safe street crossing. Did not bring in an assistive device but it was recommended to her given her  high fall risk.   Patient presents with significant balance, pain, muscle performance (strength/power/endurance), activity tolerance, vestibular, gait pattern impairments that are limiting ability to complete her usual activities including safe household and community ambulation, ADLs, IADLs, community/social participation, caring for her dog, housekeeping, etc, without difficulty. Patient will benefit from skilled physical therapy intervention to address current body structure impairments and activity limitations to improve function and work towards goals set in current POC in order to return to prior level of function or maximal functional improvement.    Personal Factors and Comorbidities Age;Comorbidity 3+;Past/Current Experience;Fitness;Time since onset of injury/illness/exacerbation;Social Background    Comorbidities Relevant past medical history and comorbidities include arthritis, breast cancer (resolved with partial mastectomy > 5 years ago), hypertension, renal disorder, thyroid disease, hx hysterectomy, back surgery (does not remember a surgery), joint replacement (left knee), eye biopsy, heart arrhythmia, except cardiac ablation for  arhythmia), leaky bladder, stents in B femoral arteries, tibial tendon problem in left ankle, vertebral fracture from fall in 2021, R knee pain, low back pain.    Examination-Activity Limitations Bathing;Lift;Stand;Locomotion Level;Transfers;Reach Overhead;Caring for Others;Carry;Squat;Hygiene/Grooming;Stairs;Bend    [employer] Activity;Cleaning;Meal Prep;Yard Work;Interpersonal Relationship;Laundry;Shop   caring for her dog   Stability/Clinical Decision Making Evolving/Moderate complexity    Clinical Decision Making Moderate    Rehab Potential Good    PT Frequency 2x / week    PT Duration 12 weeks    PT Treatment/Interventions ADLs/Self Care Home Management;Cryotherapy;Moist Heat;Electrical Stimulation;Gait training;Stair training;DME Instruction;Functional mobility training;Therapeutic activities;Patient/family education;Neuromuscular re-education;Balance training;Therapeutic exercise;Manual techniques;Spinal Manipulations;Dry needling;Vestibular;Joint Manipulations;Passive range of motion    PT Next Visit Plan establish initial HEP, balance/strength training    PT Home Exercise Plan to be established visit 2 as appropriate    Consulted and Agree with Plan of Care Patient           Patient will benefit from skilled therapeutic intervention in order to improve the following deficits and impairments:  Abnormal gait,Decreased knowledge of use of DME,Dizziness,Improper body mechanics,Pain,Decreased mobility,Decreased coordination,Impaired perceived functional ability,Decreased strength,Decreased range of motion,Decreased endurance,Decreased activity tolerance,Decreased balance,Decreased safety awareness,Difficulty walking  Visit Diagnosis: Unsteadiness on feet  Repeated falls  Difficulty in walking, not elsewhere classified  Muscle weakness (generalized)  Chronic pain of right knee  Low back pain, unspecified back pain laterality, unspecified  chronicity, unspecified whether sciatica present     Problem List There are no problems to display for this patient.   Everlean Alstrom. Graylon Good, PT, DPT 06/25/20, 8:47 PM  [facility] [address] [place], [address] Phone: [phone]   Fax:  [phone]  Name: Thana Ramp MRN: [id number] Date of Birth: 1935/04/26

## 2020-06-30 ENCOUNTER — Ambulatory Visit
Admission: EM | Admit: 2020-06-30 | Discharge: 2020-06-30 | Disposition: A | Discharge status: Home / Self Care | Payer: Medicare Other

## 2020-06-30 ENCOUNTER — Emergency Department: Payer: Medicare Other

## 2020-06-30 ENCOUNTER — Emergency Department
Admission: EM | Admit: 2020-06-30 | Discharge: 2020-06-30 | Disposition: A | Discharge status: Home / Self Care | Payer: Medicare Other | Attending: Emergency Medicine | Admitting: Emergency Medicine

## 2020-06-30 ENCOUNTER — Encounter: Payer: Medicare Other | Admitting: Physical Therapy

## 2020-06-30 ENCOUNTER — Encounter: Payer: Self-pay | Admitting: Emergency Medicine

## 2020-06-30 ENCOUNTER — Other Ambulatory Visit: Payer: Self-pay

## 2020-06-30 DIAGNOSIS — W1830XA Fall on same level, unspecified, initial encounter: Secondary | ICD-10-CM | POA: Diagnosis not present

## 2020-06-30 DIAGNOSIS — R519 Headache, unspecified: Secondary | ICD-10-CM | POA: Insufficient documentation

## 2020-06-30 DIAGNOSIS — S300XXA Contusion of lower back and pelvis, initial encounter: Secondary | ICD-10-CM

## 2020-06-30 DIAGNOSIS — R110 Nausea: Secondary | ICD-10-CM | POA: Diagnosis not present

## 2020-06-30 DIAGNOSIS — Z79899 Other long term (current) drug therapy: Secondary | ICD-10-CM | POA: Diagnosis not present

## 2020-06-30 DIAGNOSIS — S060X0A Concussion without loss of consciousness, initial encounter: Secondary | ICD-10-CM | POA: Insufficient documentation

## 2020-06-30 DIAGNOSIS — Z9660 Presence of unspecified orthopedic joint implant: Secondary | ICD-10-CM | POA: Diagnosis not present

## 2020-06-30 DIAGNOSIS — R531 Weakness: Secondary | ICD-10-CM | POA: Insufficient documentation

## 2020-06-30 DIAGNOSIS — W19XXXA Unspecified fall, initial encounter: Secondary | ICD-10-CM

## 2020-06-30 DIAGNOSIS — Z7982 Long term (current) use of aspirin: Secondary | ICD-10-CM | POA: Insufficient documentation

## 2020-06-30 DIAGNOSIS — R296 Repeated falls: Secondary | ICD-10-CM

## 2020-06-30 DIAGNOSIS — M545 Low back pain, unspecified: Secondary | ICD-10-CM

## 2020-06-30 DIAGNOSIS — I10 Essential (primary) hypertension: Secondary | ICD-10-CM | POA: Insufficient documentation

## 2020-06-30 DIAGNOSIS — Z859 Personal history of malignant neoplasm, unspecified: Secondary | ICD-10-CM | POA: Diagnosis not present

## 2020-06-30 DIAGNOSIS — R42 Dizziness and giddiness: Secondary | ICD-10-CM | POA: Diagnosis not present

## 2020-06-30 DIAGNOSIS — S3992XA Unspecified injury of lower back, initial encounter: Secondary | ICD-10-CM | POA: Diagnosis present

## 2020-06-30 LAB — BASIC METABOLIC PANEL
Anion gap: 9 (ref 5–15)
BUN: 25 mg/dL — ABNORMAL HIGH (ref 8–23)
CO2: 29 mmol/L (ref 22–32)
Calcium: 10.7 mg/dL — ABNORMAL HIGH (ref 8.9–10.3)
Chloride: 99 mmol/L (ref 98–111)
Creatinine, Ser: 0.9 mg/dL (ref 0.44–1.00)
GFR, Estimated: >60 mL/min (ref >60)
Glucose, Bld: 98 mg/dL (ref 70–99)
Potassium: 4.2 mmol/L (ref 3.5–5.1)
Sodium: 137 mmol/L (ref 135–145)

## 2020-06-30 LAB — CBC
HCT: 36.4 % (ref 36.0–46.0)
Hemoglobin: 11.6 g/dL — ABNORMAL LOW (ref 12.0–15.0)
MCH: 28.8 pg (ref 26.0–34.0)
MCHC: 31.9 g/dL (ref 30.0–36.0)
MCV: 90.3 fL (ref 80.0–100.0)
Platelets: 261 10*3/uL (ref 150–400)
RBC: 4.03 MIL/uL (ref 3.87–5.11)
RDW: 13.7 % (ref 11.5–15.5)
WBC: 9.2 10*3/uL (ref 4.0–10.5)
nRBC: 0 % (ref 0.0–0.2)

## 2020-06-30 LAB — URINALYSIS, COMPLETE (UACMP) WITH MICROSCOPIC
Bacteria, UA: NONE SEEN
Bilirubin Urine: NEGATIVE
Glucose, UA: NEGATIVE mg/dL
Hgb urine dipstick: NEGATIVE
Ketones, ur: 20 mg/dL — AB
Leukocytes,Ua: NEGATIVE
Nitrite: NEGATIVE
Protein, ur: NEGATIVE mg/dL
Specific Gravity, Urine: 1.017 (ref 1.005–1.030)
pH: 6 (ref 5.0–8.0)

## 2020-06-30 MED ORDER — LIDOCAINE 5 % EX PTCH
1.0000 | MEDICATED_PATCH | CUTANEOUS | Status: DC
  Administered 2020-06-30: 1 via TRANSDERMAL
  Filled 2020-06-30: qty 1

## 2020-06-30 MED ORDER — ACETAMINOPHEN 500 MG PO TABS
1000.0000 mg | ORAL_TABLET | Freq: Once | ORAL | Status: AC
  Administered 2020-06-30: 1000 mg via ORAL
  Filled 2020-06-30: qty 2

## 2020-06-30 NOTE — ED Provider Notes (Signed)
[facility] Emergency Department Provider Note   ____________________________________________   Event Date/Time   First MD Initiated Contact with Patient 06/30/20 2028     (approximate)  I have reviewed the triage vital signs and the nursing notes.   HISTORY  Chief Complaint Fall    HPI Tracy Long is a 86 y.o. female with past medical history of hypertension and hypothyroidism who presents to the ED complaining of fall.  Patient reports that she has had about 5 falls since Thanksgiving, where she suddenly seems weak and her legs give out on her.  She had another fall last night where she lost her balance and fell onto her backside, denies hitting her head or losing consciousness.  She complains of significant pain and bruising around her left buttock, has been able to ambulate but with some discomfort.  She does state "my whole body was shaken" and she developed a headache after waking up this morning.  She denies any neck pain, numbness, weakness, vision changes, or speech changes.  She was initially evaluated at urgent care and referred to the ED for further evaluation for traumatic injury.        Past Medical History:  Diagnosis Date  . Arthritis   . Cancer ([place])   . Hypertension   . Inverted nipple   . Renal disorder   . Thyroid disease     There are no problems to display for this patient.   Past Surgical History:  Procedure Laterality Date  . ABDOMINAL HYSTERECTOMY    . BACK SURGERY    . BREAST EXCISIONAL BIOPSY Bilateral    [facility]  . BREAST SURGERY    . EYE SURGERY    . JOINT REPLACEMENT    . SKIN BIOPSY      Prior to Admission medications   Medication Sig Start Date End Date Taking? Authorizing Provider  acetaminophen (TYLENOL) 500 MG tablet Take 500 mg by mouth every 6 (six) hours as needed.    [provider]  aspirin EC 81 MG tablet Take 81 mg by mouth daily.    [provider]  calcium carbonate  (OSCAL) 1500 (600 Ca) MG TABS tablet Take by mouth 2 (two) times daily with a meal.    [provider]  cetirizine (ZYRTEC) 10 MG tablet Take 10 mg by mouth daily.    [provider]  cholecalciferol (VITAMIN D) 400 units TABS tablet Take 400 Units by mouth.    [provider]  conjugated estrogens (PREMARIN) vaginal cream Place 1 Applicatorful vaginally daily.    [provider]  cycloSPORINE (RESTASIS) 0.05 % ophthalmic emulsion 1 drop 2 (two) times daily.    [provider]  docusate sodium (COLACE) 100 MG capsule Take 100 mg by mouth 2 (two) times daily.    [provider]  fluticasone (VERAMYST) 27.5 MCG/SPRAY nasal spray Place 2 sprays into the nose daily.    [provider]  levothyroxine (SYNTHROID, LEVOTHROID) 75 MCG tablet Take 75 mcg by mouth daily before breakfast.    [provider]  meloxicam (MOBIC) 15 MG tablet Take 15 mg by mouth daily.    [provider]  metoprolol succinate (TOPROL-XL) 25 MG 24 hr tablet Take 25 mg by mouth daily.    [provider]  multivitamin-iron-minerals-folic acid (CENTRUM) chewable tablet Chew 1 tablet by mouth daily.    [provider]  PARoxetine (PAXIL) 10 MG tablet Take 10 mg by mouth daily.  [provider]  PROPYLENE GLYCOL OP Apply to eye.    [provider]    Allergies Aloe, Bactrim [sulfamethoxazole-trimethoprim], Cardizem [diltiazem], Doxycycline, Macrobid [nitrofurantoin], Penicillins, and Statins  Family History  Problem Relation Age of Onset  . Breast cancer Neg Hx     Social History Social History   Tobacco Use  . Smoking status: Never Smoker  . Smokeless tobacco: Never Used  Vaping Use  . Vaping Use: Never used  Substance Use Topics  . Alcohol use: No  . Drug use: No    Review of Systems  Constitutional: No fever/chills.  Positive for generalized weakness. Eyes: No visual changes. ENT: No sore  throat. Cardiovascular: Denies chest pain. Respiratory: Denies shortness of breath. Gastrointestinal: No abdominal pain.  No nausea, no vomiting.  No diarrhea.  No constipation. Genitourinary: Negative for dysuria. Musculoskeletal: Positive for left hip pain and back pain. Skin: Negative for rash. Neurological: Positive for headaches, negative for focal weakness or numbness.  ____________________________________________   PHYSICAL EXAM:  VITAL SIGNS: ED Triage Vitals [06/30/20 1506]  Enc Vitals Group     BP (!) 166/80     Pulse Rate 81     Resp 20     Temp 99.5 F (37.5 C)     Temp Source Oral     SpO2 100 %     Weight      Height      Head Circumference      Peak Flow      Pain Score 10     Pain Loc      Pain Edu?      Excl. in [place]?     Constitutional: Alert and oriented. Eyes: Conjunctivae are normal. Head: Atraumatic. Nose: No congestion/rhinnorhea. Mouth/Throat: Mucous membranes are moist. Neck: Normal ROM, no midline cervical spine tenderness. Cardiovascular: Normal rate, regular rhythm. Grossly normal heart sounds. Respiratory: Normal respiratory effort.  No retractions. Lungs CTAB. Gastrointestinal: Soft and nontender. No distention. Genitourinary: deferred Musculoskeletal: Tenderness to palpation of her midline lumbar spine as well as left hip.  Ecchymosis noted over left buttock.  Able to range left hip with some discomfort, no tenderness at right hip, bilateral knees, or bilateral ankles. Neurologic:  Normal speech and language. No gross focal neurologic deficits are appreciated. Skin:  Skin is warm, dry and intact. No rash noted. Psychiatric: Mood and affect are normal. Speech and behavior are normal.  ____________________________________________   LABS (all labs ordered are listed, but only abnormal results are displayed)  Labs Reviewed  BASIC METABOLIC PANEL - Abnormal; Notable for the following components:      Result Value   BUN 25 (*)     Calcium 10.7 (*)    All other components within normal limits  CBC - Abnormal; Notable for the following components:   Hemoglobin 11.6 (*)    All other components within normal limits  URINALYSIS, COMPLETE (UACMP) WITH MICROSCOPIC - Abnormal; Notable for the following components:   Color, Urine YELLOW (*)    APPearance CLEAR (*)    Ketones, ur 20 (*)    All other components within normal limits   ____________________________________________  EKG  ED ECG REPORT I, Blake Divine, the attending physician, personally viewed and interpreted this ECG.   Date: 06/30/2020  EKG Time: 15:01  Rate: 73  Rhythm: normal sinus rhythm  Axis: Normal  Intervals:none  ST&T Change: None   PROCEDURES  Procedure(s) performed (including Critical Care):  Procedures   ____________________________________________   INITIAL IMPRESSION /  ASSESSMENT AND PLAN / ED COURSE       86 year old female with past medical history of hypertension and hypothyroidism who presents to the ED with multiple falls over the past few months including a fall last night onto her lower back and left hip.  She denies hitting her head or losing consciousness, but was dealing with a headache today.  No focal deficits noted on neurologic exam, CT head is negative for acute process, CT C-spine also negative.  X-rays of left hip are reviewed by me and show no fracture or dislocation, we will also check x-rays of lumbar spine.  We will treat pain with Lidoderm patch and Tylenol.  Work-up for her generalized weakness is unremarkable, EKG shows no evidence of arrhythmia or ischemia and UA shows no evidence of infection.  X-rays of lumbar spine are negative for acute process, show old fractures status post kyphoplasty.  Daughter updated on findings and agrees with plan for discharge home with close PCP follow-up to discuss home health or other help at home.  Daughter counseled to have patient return to the ED for new worsening  symptoms, patient and daughter agree with plan.      ____________________________________________   FINAL CLINICAL IMPRESSION(S) / ED DIAGNOSES  Final diagnoses:  Frequent falls  Concussion without loss of consciousness, initial encounter     ED Discharge Orders    None       Note:  This document was prepared using Dragon voice recognition software and may include unintentional dictation errors.   Blake Divine, MD 06/30/20 2131

## 2020-06-30 NOTE — Discharge Instructions (Addendum)

## 2020-06-30 NOTE — ED Notes (Signed)
Patient is being discharged from the Urgent Care and sent to the Emergency Department via EMS . Per Christene Slates, patient is in need of higher level of care due to Fall and Dizziness. Patient is aware and verbalizes understanding of plan of care.  Vitals:   06/30/20 1230  BP: (!) 155/80  Pulse: 65  Resp: 18  Temp: 98.9 F (37.2 C)  SpO2: 100%

## 2020-06-30 NOTE — ED Triage Notes (Signed)
Patient states that she fell around 5pm last night at home inside. States that she went to put her dog in the kennel and fell backwards. Patient reports that she fell on her buttock and waist, denies hitting her head but has verbalized that her head is hurting (10/10) States that she feels like she is still confused and concerned that she has a concussion.

## 2020-06-30 NOTE — ED Provider Notes (Signed)
[facility]    CSN: [id number] Arrival date & time: 06/30/20  1139      History   Chief Complaint Chief Complaint  Patient presents with  . Fall  . Back Pain    HPI Tracy Long is a 86 y.o. female presenting with her daughter for severe headache since last night.  She says it is probably the worst headaches of her head.  Patient states that she got off balance and fell down at about 5 PM last night.  She says that she fell onto her butt and back but she did not hit her head and denies loss of consciousness.  States that her head pain is worse than her back or buttocks pain.  She says it is associated with light sensitivity, nausea without vomiting, dizziness and feeling off balance and unsteady.  Patient also admits to feeling confused and more tired than normal.  Patient says she has been having frequent falls since Thanksgiving of last year and is not sure why.  Her fall before this current one was around Christmas time 2021.  Patient has not been worked up for these frequent falls that seem to start suddenly.  She denies ever having any preceding symptoms before the falls.  Denies chest pain or breathing difficulty.  Patient does not report taking any anticoagulants.  She does have history of previous back surgery.  Patient has taken Tylenol for the headache without relief.  Patient's daughter does report calling EMS last night after the fall happened and was told that she had a negative stroke screen and they advised her to follow-up with urgent care the following day.  Patient has no other concerns today.  HPI  Past Medical History:  Diagnosis Date  . Arthritis   . Cancer ([place])   . Hypertension   . Inverted nipple   . Renal disorder   . Thyroid disease     There are no problems to display for this patient.   Past Surgical History:  Procedure Laterality Date  . ABDOMINAL HYSTERECTOMY    . BACK SURGERY    . BREAST EXCISIONAL BIOPSY Bilateral    [facility]   . BREAST SURGERY    . EYE SURGERY    . JOINT REPLACEMENT    . SKIN BIOPSY      OB History   No obstetric history on file.      Home Medications    Prior to Admission medications   Medication Sig Start Date End Date Taking? Authorizing Provider  acetaminophen (TYLENOL) 500 MG tablet Take 500 mg by mouth every 6 (six) hours as needed.   Yes [provider]  aspirin EC 81 MG tablet Take 81 mg by mouth daily.   Yes [provider]  calcium carbonate (OSCAL) 1500 (600 Ca) MG TABS tablet Take by mouth 2 (two) times daily with a meal.   Yes [provider]  cetirizine (ZYRTEC) 10 MG tablet Take 10 mg by mouth daily.   Yes [provider]  cholecalciferol (VITAMIN D) 400 units TABS tablet Take 400 Units by mouth.   Yes [provider]  conjugated estrogens (PREMARIN) vaginal cream Place 1 Applicatorful vaginally daily.   Yes [provider]  cycloSPORINE (RESTASIS) 0.05 % ophthalmic emulsion 1 drop 2 (two) times daily.   Yes [provider]  docusate sodium (COLACE) 100 MG capsule Take 100 mg by mouth 2 (two) times daily.   Yes [provider]  fluticasone (VERAMYST) 27.5 MCG/SPRAY  nasal spray Place 2 sprays into the nose daily.   Yes [provider]  levothyroxine (SYNTHROID, LEVOTHROID) 75 MCG tablet Take 75 mcg by mouth daily before breakfast.   Yes [provider]  meloxicam (MOBIC) 15 MG tablet Take 15 mg by mouth daily.   Yes [provider]  metoprolol succinate (TOPROL-XL) 25 MG 24 hr tablet Take 25 mg by mouth daily.   Yes [provider]  multivitamin-iron-minerals-folic acid (CENTRUM) chewable tablet Chew 1 tablet by mouth daily.   Yes [provider]  PARoxetine (PAXIL) 10 MG tablet Take 10 mg by mouth daily.   Yes [provider]  PROPYLENE GLYCOL OP Apply to eye.   Yes [provider]    Family History Family History  Problem Relation Age of  Onset  . Breast cancer Neg Hx     Social History Social History   Tobacco Use  . Smoking status: Never Smoker  . Smokeless tobacco: Never Used  Vaping Use  . Vaping Use: Never used  Substance Use Topics  . Alcohol use: No  . Drug use: No     Allergies   Aloe, Bactrim [sulfamethoxazole-trimethoprim], Cardizem [diltiazem], Doxycycline, Macrobid [nitrofurantoin], Penicillins, and Statins   Review of Systems Review of Systems  Constitutional: Positive for fatigue.  Eyes: Positive for photophobia. Negative for visual disturbance.  Respiratory: Negative for shortness of breath.   Cardiovascular: Negative for chest pain.  Gastrointestinal: Positive for nausea. Negative for vomiting.  Musculoskeletal: Positive for back pain.  Skin: Positive for color change. Negative for wound.  Neurological: Positive for dizziness, light-headedness and headaches. Negative for seizures, syncope, facial asymmetry, speech difficulty, weakness and numbness.  Hematological: Does not bruise/bleed easily.  Psychiatric/Behavioral: Positive for confusion and sleep disturbance.     Physical Exam Triage Vital Signs ED Triage Vitals  Enc Vitals Group     BP 06/30/20 1230 (!) 155/80     Pulse Rate 06/30/20 1230 65     Resp 06/30/20 1230 18     Temp 06/30/20 1230 98.9 F (37.2 C)     Temp Source 06/30/20 1230 Oral     SpO2 06/30/20 1230 100 %     Weight 06/30/20 1227 139 lb (63 kg)     Height 06/30/20 1227 5\' 5"  (1.651 m)     Head Circumference --      Peak Flow --      Pain Score 06/30/20 1227 10     Pain Loc --      Pain Edu? --      Excl. in [place]? --    No data found.  Updated Vital Signs BP (!) 155/80 (BP Location: Left Arm)   Pulse 65   Temp 98.9 F (37.2 C) (Oral)   Resp 18   Ht 5\' 5"  (1.651 m)   Wt 139 lb (63 kg)   SpO2 100%   BMI 23.13 kg/m       Physical Exam Vitals and nursing note reviewed.  Constitutional:      General: She is not in acute distress.    Appearance:  Normal appearance. She is not ill-appearing or toxic-appearing.     Comments: Patient is sitting in a wheelchair with her hands over her eyes and holding her head because she said her headache is so severe  HENT:     Head: Normocephalic and atraumatic.     Right Ear: Tympanic membrane, ear canal and external ear normal.     Left Ear: Tympanic membrane,  ear canal and external ear normal.     Nose: Nose normal.     Mouth/Throat:     Mouth: Mucous membranes are moist.     Pharynx: Oropharynx is clear.  Eyes:     General: No scleral icterus.       Right eye: No discharge.        Left eye: No discharge.     Extraocular Movements: Extraocular movements intact.     Conjunctiva/sclera: Conjunctivae normal.     Pupils: Pupils are equal, round, and reactive to light.  Cardiovascular:     Rate and Rhythm: Normal rate and regular rhythm.     Heart sounds: Normal heart sounds.  Pulmonary:     Effort: Pulmonary effort is normal. No respiratory distress.     Breath sounds: Normal breath sounds. No wheezing, rhonchi or rales.  Musculoskeletal:     Cervical back: Neck supple.     Lumbar back: Tenderness (diffuse TTP bilateral lumbar region) present.     Comments: Large area of ecchymosis left buttocks  Skin:    General: Skin is dry.  Neurological:     General: No focal deficit present.     Mental Status: She is alert. Mental status is at baseline.     Cranial Nerves: Cranial nerves are intact.     Motor: Motor function is intact. No weakness.     Coordination: Finger-Nose-Finger Test normal.     Gait: Gait abnormal.  Psychiatric:        Mood and Affect: Mood normal.        Behavior: Behavior normal.        Thought Content: Thought content normal.      UC Treatments / Results  Labs (all labs ordered are listed, but only abnormal results are displayed) Labs Reviewed - No data to display  EKG   Radiology No results found.  Procedures Procedures (including critical care  time)  Medications Ordered in UC Medications - No data to display  Initial Impression / Assessment and Plan / UC Course  I have reviewed the triage vital signs and the nursing notes.  Pertinent labs & imaging results that were available during my care of the patient were reviewed by me and considered in my medical decision making (see chart for details).   86 year old female presenting with daughter for severe headache, photophobia, nausea without vomiting, dizziness, lower back pain and pain of the left buttocks following fall last night.  In the clinic blood pressure is little elevated at 155/80.  The rest of vital signs are stable.  Patient is in a wheelchair and as her hands over her eyes complaining of severe headache.  Neurological exam is reassuring.  She is alert and oriented x3.  I was unable to have her stand and walk because she is very dizzy and off-balance whenever she stands.  Additionally she has large hematoma of the left buttocks.  Based on patient's complaint of severe headache and other associated symptoms of nausea, dizziness, photophobia and feeling a little confused, advised that she follow-up in the emergency department this time for CT scan of her head.  Patient's daughter not comfortable taking her with these various complaints at this time so EMS has been called.  Concern for possible intracranial abnormality versus concussion.  Patient also needs imaging of her lower back and possibly left hip.  Patient leaving in stable condition by EMS at this time.   Final Clinical Impressions(s) / UC Diagnoses   Final diagnoses:  Severe headache  Fall, initial encounter  Nausea without vomiting  Dizziness  Traumatic hematoma of buttock, initial encounter  Acute bilateral low back pain, unspecified whether sciatica present     Discharge Instructions     You have been advised to follow up immediately in the emergency department for concerning signs.symptoms. If you declined  EMS transport, please have a family member take you directly to the ED at this time. Do not delay. Based on concerns about condition, if you do not follow up in th e ED, you may risk poor outcomes including worsening of condition, delayed treatment and potentially life threatening issues. If you have declined to go to the ED at this time, you should call your PCP immediately to set up a follow up appointment.  Go to ED for red flag symptoms, including; fevers you cannot reduce with Tylenol/Motrin, severe headaches, vision changes, numbness/weakness in part of the body, lethargy, confusion, intractable vomiting, severe dehydration, chest pain, breathing difficulty, severe persistent abdominal or pelvic pain, signs of severe infection (increased redness, swelling of an area), feeling faint or passing out, dizziness, etc. You should especially go to the ED for sudden acute worsening of condition if you do not elect to go at this time.     ED Prescriptions    None     PDMP not reviewed this encounter.   Danton Clap, PA-C 06/30/20 1323

## 2020-06-30 NOTE — ED Triage Notes (Signed)
See first RN Note: pt c/o increasing falls since thanksgiving. Pt c/o HA and L hip pain from fall last night.

## 2020-06-30 NOTE — ED Triage Notes (Signed)
From [facility].  Denies hitting head last night.  Daughter reports multiple falls over past months.  Patient states she fell backward to hook the dogs up and fell backward.  Also c/o dizziness and nausea when standing up.  VS wnl.

## 2020-07-02 ENCOUNTER — Encounter: Payer: Medicare Other | Admitting: Physical Therapy

## 2020-07-02 ENCOUNTER — Ambulatory Visit: Payer: Medicare Other | Admitting: Physical Therapy

## 2020-07-07 ENCOUNTER — Other Ambulatory Visit: Payer: Self-pay | Admitting: Neurology

## 2020-07-07 ENCOUNTER — Encounter: Payer: Medicare Other | Admitting: Physical Therapy

## 2020-07-07 DIAGNOSIS — G20 Parkinson's disease: Secondary | ICD-10-CM

## 2020-07-09 ENCOUNTER — Encounter: Payer: Medicare Other | Admitting: Physical Therapy

## 2020-07-10 ENCOUNTER — Telehealth: Payer: Self-pay | Admitting: Physical Therapy

## 2020-07-10 NOTE — Telephone Encounter (Signed)
Called patient to check on her prior to her next scheduled visit on 06/13/2020. Patient states she had a fall, was diagnosed with Parkinson's and is not allowed to drive for at least the next 3 months and is starting PT/OT at home today/tomorrow. Explained we would need to cancel PT here until she is done with home health and will need a referral for further PT at that point if she continues to need it. Patient agreed.   Updated office staff.   Everlean Alstrom. Graylon Good, PT, DPT 07/10/20, 3:55 PM

## 2020-07-11 ENCOUNTER — Encounter: Payer: Self-pay | Admitting: Physical Therapy

## 2020-07-11 DIAGNOSIS — M545 Low back pain, unspecified: Secondary | ICD-10-CM

## 2020-07-11 DIAGNOSIS — M6281 Muscle weakness (generalized): Secondary | ICD-10-CM

## 2020-07-11 DIAGNOSIS — R2681 Unsteadiness on feet: Secondary | ICD-10-CM

## 2020-07-11 DIAGNOSIS — R296 Repeated falls: Secondary | ICD-10-CM

## 2020-07-11 DIAGNOSIS — G8929 Other chronic pain: Secondary | ICD-10-CM

## 2020-07-11 DIAGNOSIS — R262 Difficulty in walking, not elsewhere classified: Secondary | ICD-10-CM

## 2020-07-11 DIAGNOSIS — M25561 Pain in right knee: Secondary | ICD-10-CM

## 2020-07-11 NOTE — Therapy (Signed)
[facility] [address] [place], [address] Phone: [phone]   Fax:  [phone]  Physical Therapy No-Visit Discharge Summary Dates of reporting period: 06/25/2020 - 07/11/2020  Patient Details  Name: Tracy Long MRN: [id number] Date of Birth: [birth year] Referring Provider (PT): Velna Ochs, MD   Encounter Date: 07/11/2020    Past Medical History:  Diagnosis Date  . Arthritis   . Cancer ([place])   . Hypertension   . Inverted nipple   . Renal disorder   . Thyroid disease     Past Surgical History:  Procedure Laterality Date  . ABDOMINAL HYSTERECTOMY    . BACK SURGERY    . BREAST EXCISIONAL BIOPSY Bilateral    [facility]  . BREAST SURGERY    . EYE SURGERY    . JOINT REPLACEMENT    . SKIN BIOPSY      There were no vitals filed for this visit.   Subjective Assessment - 07/11/20 0937    Subjective Called patient 07/10/20 to check on her prior to her next scheduled visit on 06/13/2020. Patient states she had a fall, was diagnosed with Parkinson's and is not allowed to drive for at least the next 3 months and is starting PT/OT at home today/tomorrow. Explained we would need to cancel PT here until she is done with home health and will need a referral for further PT at that point if she continues to need it. Patient agreed.    Pertinent History Patient is a 86 y.o. female who presents to outpatient physical therapy with a referral for medical diagnosis unsteady gait. This patient's chief complaints consist of feeling wobbly and unbalanced with repeated falling with injuries and weakness leading to the following functional deficits: difficulty with safe household and community ambulation, ADLs, IADLs, community/social participation, etc. She also complains of some R knee pain and back pain.   Relevant past medical history and comorbidities include arthritis, breast cancer (resolved with partial mastectomy  > 5 years ago), hypertension, renal disorder, thyroid disease, hx hysterectomy, back surgery (does not remember a surgery), joint replacement (left knee), eye biopsy, heart arrhythmia, except cardiac ablation for arhythmia), leaky bladder, stents in B femoral arteries, tibial tendon problem in left ankle, vertebral fracture from fall in 2021, R knee pain, low back pain. Patient denies hx of stroke, seizures, lung problem, diabetes, unexplained weight loss, changes in bowel or bladder problems.    Limitations Lifting;Standing;Walking;House hold activities   difficulty with safe household and community ambulation, ADLs, IADLs, community/social participation, etc   Patient Stated Goals improve balance          OBJECTIVE Patient is not present for examination at this time. Please see previous documentation for latest objective data.      PT Short Term Goals - 07/11/20 0940      PT SHORT TERM GOAL #1   Title Be independent with initial home exercise program for self-management of symptoms.    Baseline Initial HEP to be established at visit 2 as appropriate (06/25/2020);    Time 3    Period Weeks    Status Not Met    Target Date 07/16/20             PT Long Term Goals - 07/11/20 0940      PT LONG TERM GOAL #1   Title Be independent with a long-term home exercise program for self-management of symptoms.    Baseline Initial HEP to be established  at visit 2 as appropriate (06/25/2020);    Time 12    Period Weeks    Status Not Met   TARGET DATE FOR ALL LONG TERM GOALS: 09/17/2020     PT LONG TERM GOAL #2   Title Demonstrate improved FOTO score to equal or greater than 56 to demonstrate improvement in overall condition and self-reported functional ability.    Baseline 51 (06/25/2020);    Time 12    Period Weeks    Status Not Met      PT LONG TERM GOAL #3   Title Patient will complete 5 Time Sit to Stand test in equal or less than 15 seconds with no UE support from 18.5 inch surface or lower  to demonstrate decreased fall risk and improved functional strength for transfers and mobility.    Baseline 32 seconds from 18.5 inch plinth with B UE support (06/25/2020);    Time 12    Period Weeks    Status Not Met      PT LONG TERM GOAL #4   Title Patient will improve score on Functional Gait Assessment to equal or greater than 25/30 to demonstrate reduced fall risk to low fall risk to improve her safety during ADLs, IADLs, and ambulation.    Baseline 15/30 (06/25/2020);    Time 12    Period Weeks    Status Not Met      PT LONG TERM GOAL #5   Title Patient wil demonstrate walking speed of equal or greater than 1.2 meters per second to demontsrate improved community mobility and ability to cross the street safely    Baseline 0.75 meters/second    Time 12    Period Weeks    Status Not Met             Plan - 07/11/20 0943    Clinical Impression Statement Patient attended initial eval only. She fell at home and was subsequently diagnosed with Parkinson's disease and has been instructed by physician not to drive for at least 3 months. She is now getting home home health PT and ineligible for insurance reimbursement of outpatient PT. She is also unable to get to outpatient PT due to lack of transportation or ability to drive. She is now discharged from OP PT for this reason.    Personal Factors and Comorbidities Age;Comorbidity 3+;Past/Current Experience;Fitness;Time since onset of injury/illness/exacerbation;Social Background    Comorbidities Relevant past medical history and comorbidities include arthritis, breast cancer (resolved with partial mastectomy > 5 years ago), hypertension, renal disorder, thyroid disease, hx hysterectomy, back surgery (does not remember a surgery), joint replacement (left knee), eye biopsy, heart arrhythmia, except cardiac ablation for arhythmia), leaky bladder, stents in B femoral arteries, tibial tendon problem in left ankle, vertebral fracture from fall in 2021,  R knee pain, low back pain.    Examination-Activity Limitations Bathing;Lift;Stand;Locomotion Level;Transfers;Reach Overhead;Caring for Others;Carry;Squat;Hygiene/Grooming;Stairs;Bend    [employer] Activity;Cleaning;Meal Prep;Yard Work;Interpersonal Relationship;Laundry;Shop   caring for her dog   Stability/Clinical Decision Making Evolving/Moderate complexity    Rehab Potential Good    PT Frequency 2x / week    PT Duration 12 weeks    PT Treatment/Interventions ADLs/Self Care Home Management;Cryotherapy;Moist Heat;Electrical Stimulation;Gait training;Stair training;DME Instruction;Functional mobility training;Therapeutic activities;Patient/family education;Neuromuscular re-education;Balance training;Therapeutic exercise;Manual techniques;Spinal Manipulations;Dry needling;Vestibular;Joint Manipulations;Passive range of motion    PT Next Visit Plan Patient is now discharged from [facility] none due to lack of ability to attend subsequent visits    Consulted  and Agree with Plan of Care Patient           Patient will benefit from skilled therapeutic intervention in order to improve the following deficits and impairments:  Abnormal gait,Decreased knowledge of use of DME,Dizziness,Improper body mechanics,Pain,Decreased mobility,Decreased coordination,Impaired perceived functional ability,Decreased strength,Decreased range of motion,Decreased endurance,Decreased activity tolerance,Decreased balance,Decreased safety awareness,Difficulty walking  Visit Diagnosis: Unsteadiness on feet  Repeated falls  Difficulty in walking, not elsewhere classified  Muscle weakness (generalized)  Chronic pain of right knee  Low back pain, unspecified back pain laterality, unspecified chronicity, unspecified whether sciatica present     Problem List There are no problems to display for this patient.   Everlean Alstrom. Graylon Good, PT, DPT 07/11/20, 9:44 AM  [facility] [address], [place], [address] Phone: [phone]   Fax:  [phone]  Name: Tracy Long MRN: [id number] Date of Birth: [birth year]

## 2020-07-14 ENCOUNTER — Encounter: Payer: Medicare Other | Admitting: Physical Therapy

## 2020-07-16 ENCOUNTER — Encounter: Payer: Medicare Other | Admitting: Physical Therapy

## 2020-07-18 ENCOUNTER — Ambulatory Visit (HOSPITAL_COMMUNITY)
Admission: RE | Admit: 2020-07-18 | Discharge: 2020-07-18 | Disposition: A | Discharge status: Home / Self Care | Payer: Medicare Other | Source: Ambulatory Visit | Attending: Neurology | Admitting: Neurology

## 2020-07-18 ENCOUNTER — Other Ambulatory Visit: Payer: Self-pay

## 2020-07-18 DIAGNOSIS — G20 Parkinson's disease: Secondary | ICD-10-CM | POA: Diagnosis not present

## 2020-07-21 ENCOUNTER — Encounter: Payer: Medicare Other | Admitting: Physical Therapy

## 2020-07-22 ENCOUNTER — Encounter: Payer: Medicare Other | Admitting: Physical Therapy

## 2020-07-23 ENCOUNTER — Encounter: Payer: Medicare Other | Admitting: Physical Therapy

## 2020-07-24 ENCOUNTER — Encounter: Payer: Medicare Other | Admitting: Physical Therapy

## 2020-07-28 ENCOUNTER — Encounter: Payer: Medicare Other | Admitting: Physical Therapy

## 2020-07-29 ENCOUNTER — Encounter: Payer: Medicare Other | Admitting: Physical Therapy

## 2020-07-30 ENCOUNTER — Encounter: Payer: Medicare Other | Admitting: Physical Therapy

## 2020-07-31 ENCOUNTER — Encounter: Payer: Medicare Other | Admitting: Physical Therapy

## 2020-08-04 ENCOUNTER — Encounter: Payer: Medicare Other | Admitting: Physical Therapy

## 2020-08-06 ENCOUNTER — Encounter: Payer: Medicare Other | Admitting: Physical Therapy

## 2020-08-11 ENCOUNTER — Encounter: Payer: Medicare Other | Admitting: Physical Therapy

## 2020-08-13 ENCOUNTER — Encounter: Payer: Medicare Other | Admitting: Physical Therapy

## 2020-09-16 ENCOUNTER — Ambulatory Visit
Admission: EM | Admit: 2020-09-16 | Discharge: 2020-09-16 | Disposition: A | Discharge status: Home / Self Care | Payer: Medicare Other | Attending: Sports Medicine | Admitting: Sports Medicine

## 2020-09-16 ENCOUNTER — Other Ambulatory Visit: Payer: Self-pay

## 2020-09-16 ENCOUNTER — Encounter: Payer: Self-pay | Admitting: Emergency Medicine

## 2020-09-16 DIAGNOSIS — N390 Urinary tract infection, site not specified: Secondary | ICD-10-CM | POA: Insufficient documentation

## 2020-09-16 HISTORY — DX: Parkinson's disease: G20

## 2020-09-16 HISTORY — DX: Parkinson's disease without dyskinesia, without mention of fluctuations: G20.A1

## 2020-09-16 LAB — URINALYSIS, COMPLETE (UACMP) WITH MICROSCOPIC
Glucose, UA: NEGATIVE mg/dL
Ketones, ur: 15 mg/dL — AB
Nitrite: POSITIVE — AB
Protein, ur: >300 mg/dL — AB
RBC / HPF: >50 RBC/hpf (ref 0–5)
Specific Gravity, Urine: 1.02 (ref 1.005–1.030)
Squamous Epithelial / HPF: NONE SEEN (ref 0–5)
WBC, UA: >50 WBC/hpf (ref 0–5)
pH: 7 (ref 5.0–8.0)

## 2020-09-16 MED ORDER — PHENAZOPYRIDINE HCL 200 MG PO TABS: 200.0000 mg | ORAL_TABLET | Freq: Three times a day (TID) | ORAL | 0 refills | Status: DC

## 2020-09-16 MED ORDER — CIPROFLOXACIN HCL 500 MG PO TABS
500.0000 mg | ORAL_TABLET | Freq: Two times a day (BID) | ORAL | 0 refills | Status: AC
Start: 2020-09-16 — End: 2020-09-21

## 2020-09-16 NOTE — ED Triage Notes (Signed)
Patient c/o dysuria and chills that started 2 days ago.

## 2020-09-16 NOTE — Discharge Instructions (Addendum)
Take the Cipro twice daily for 5 days with food for treatment of urinary tract infection.  Use the Pyridium every 8 hours as needed for urinary discomfort.  This will turn your urine a bright red-orange.  Increase your oral fluid intake so that you increase your urine production and or flushing your urinary system.  Take an over-the-counter probiotic, such as Culturelle-Align-Activia, 1 hour after each dose of antibiotic to prevent diarrhea or yeast infections from forming.  We will culture urine and change the antibiotics if necessary.  Return for reevaluation, or see your primary care provider, for any new or worsening symptoms.

## 2020-09-16 NOTE — ED Provider Notes (Signed)
[facility]    CSN: [id number] Arrival date & time: 09/16/20  1100      History   Chief Complaint Chief Complaint  Patient presents with  . Dysuria  . Chills    HPI Tracy Long is a 86 y.o. female.   HPI   86 year old female here for evaluation of painful urination and chills.  Patient reports that her symptoms have been going on for last 2 days and included blood in her urine, urinary urgency and frequency, and low back pain.  Patient denies fever, nausea, or vomiting.  Past Medical History:  Diagnosis Date  . Arthritis   . Cancer ([place])   . Hypertension   . Inverted nipple   . Parkinson disease ([place])   . Renal disorder   . Thyroid disease     There are no problems to display for this patient.   Past Surgical History:  Procedure Laterality Date  . ABDOMINAL HYSTERECTOMY    . BACK SURGERY    . BREAST EXCISIONAL BIOPSY Bilateral    [facility]  . BREAST SURGERY    . EYE SURGERY    . JOINT REPLACEMENT    . SKIN BIOPSY      OB History   No obstetric history on file.      Home Medications    Prior to Admission medications   Medication Sig Start Date End Date Taking? Authorizing Provider  acetaminophen (TYLENOL) 500 MG tablet Take 500 mg by mouth every 6 (six) hours as needed.   Yes [provider]  aspirin EC 81 MG tablet Take 81 mg by mouth daily.   Yes [provider]  calcium carbonate (OSCAL) 1500 (600 Ca) MG TABS tablet Take by mouth 2 (two) times daily with a meal.   Yes [provider]  carbidopa-levodopa (SINEMET IR) 25-100 MG tablet Take by mouth. 07/04/20 09/15/21 Yes [provider]  cetirizine (ZYRTEC) 10 MG tablet Take 10 mg by mouth daily.   Yes [provider]  cholecalciferol (VITAMIN D) 400 units TABS tablet Take 400 Units by mouth.   Yes [provider]  ciprofloxacin (CIPRO) 500 MG tablet Take 1 tablet (500 mg total) by mouth 2 (two) times daily for 5 days. 09/16/20 09/21/20  Yes Margarette Canada, NP  cycloSPORINE (RESTASIS) 0.05 % ophthalmic emulsion 1 drop 2 (two) times daily.   Yes [provider]  docusate sodium (COLACE) 100 MG capsule Take 100 mg by mouth 2 (two) times daily.   Yes [provider]  fluticasone (VERAMYST) 27.5 MCG/SPRAY nasal spray Place 2 sprays into the nose daily.   Yes [provider]  levothyroxine (SYNTHROID, LEVOTHROID) 75 MCG tablet Take 75 mcg by mouth daily before breakfast.   Yes [provider]  meloxicam (MOBIC) 15 MG tablet Take 15 mg by mouth daily.   Yes [provider]  metoprolol succinate (TOPROL-XL) 25 MG 24 hr tablet Take 25 mg by mouth daily.   Yes [provider]  multivitamin-iron-minerals-folic acid (CENTRUM) chewable tablet Chew 1 tablet by mouth daily.   Yes [provider]  PARoxetine (PAXIL) 10 MG tablet Take 10 mg by mouth daily.   Yes [provider]  phenazopyridine (PYRIDIUM) 200 MG tablet Take 1 tablet (200 mg total) by mouth 3 (three) times daily. 09/16/20  Yes Margarette Canada, NP  PROPYLENE GLYCOL OP Apply to eye.   Yes [provider]  conjugated estrogens (PREMARIN) vaginal cream Place 1 Applicatorful vaginally daily.  [provider]    Family History Family History  Problem Relation Age of Onset  . Breast cancer Neg Hx     Social History Social History   Tobacco Use  . Smoking status: Never Smoker  . Smokeless tobacco: Never Used  Vaping Use  . Vaping Use: Never used  Substance Use Topics  . Alcohol use: No  . Drug use: No     Allergies   Aloe, Bactrim [sulfamethoxazole-trimethoprim], Cardizem [diltiazem], Doxycycline, Macrobid [nitrofurantoin], Penicillins, and Statins   Review of Systems Review of Systems  Constitutional: Negative for activity change, appetite change and fever.  Gastrointestinal: Negative for nausea and vomiting.  Genitourinary: Positive for dysuria, frequency and hematuria.   Musculoskeletal: Positive for back pain.  Skin: Negative for rash.  Hematological: Negative.   Psychiatric/Behavioral: Negative.      Physical Exam Triage Vital Signs ED Triage Vitals  Enc Vitals Group     BP 09/16/20 1142 135/87     Pulse Rate 09/16/20 1142 78     Resp 09/16/20 1142 18     Temp 09/16/20 1142 98.7 F (37.1 C)     Temp Source 09/16/20 1142 Oral     SpO2 09/16/20 1142 97 %     Weight 09/16/20 1140 138 lb 14.2 oz (63 kg)     Height 09/16/20 1140 5\' 5"  (1.651 m)     Head Circumference --      Peak Flow --      Pain Score 09/16/20 1140 6     Pain Loc --      Pain Edu? --      Excl. in [place]? --    No data found.  Updated Vital Signs BP 135/87 (BP Location: Right Arm)   Pulse 78   Temp 98.7 F (37.1 C) (Oral)   Resp 18   Ht 5\' 5"  (1.651 m)   Wt 138 lb 14.2 oz (63 kg)   SpO2 97%   BMI 23.11 kg/m   Visual Acuity Right Eye Distance:   Left Eye Distance:   Bilateral Distance:    Right Eye Near:   Left Eye Near:    Bilateral Near:     Physical Exam Vitals and nursing note reviewed.  Constitutional:      General: She is not in acute distress.    Appearance: Normal appearance. She is not ill-appearing.  HENT:     Head: Normocephalic and atraumatic.  Cardiovascular:     Rate and Rhythm: Normal rate and regular rhythm.     Pulses: Normal pulses.     Heart sounds: Normal heart sounds. No murmur heard. No gallop.   Pulmonary:     Effort: Pulmonary effort is normal.     Breath sounds: Normal breath sounds. No wheezing, rhonchi or rales.  Abdominal:     Tenderness: There is no right CVA tenderness or left CVA tenderness.  Skin:    General: Skin is warm and dry.     Capillary Refill: Capillary refill takes less than 2 seconds.     Findings: No erythema.  Neurological:     General: No focal deficit present.     Mental Status: She is alert and oriented to person, place, and time.  Psychiatric:        Mood and Affect: Mood normal.        Behavior:  Behavior normal.        Thought Content: Thought content normal.        Judgment: Judgment normal.  UC Treatments / Results  Labs (all labs ordered are listed, but only abnormal results are displayed) Labs Reviewed  URINALYSIS, COMPLETE (UACMP) WITH MICROSCOPIC - Abnormal; Notable for the following components:      Result Value   Color, Urine AMBER (*)    APPearance CLOUDY (*)    Hgb urine dipstick LARGE (*)    Bilirubin Urine MODERATE (*)    Ketones, ur 15 (*)    Protein, ur >300 (*)    Nitrite POSITIVE (*)    Leukocytes,Ua SMALL (*)    Bacteria, UA RARE (*)    All other components within normal limits  URINE CULTURE    EKG   Radiology No results found.  Procedures Procedures (including critical care time)  Medications Ordered in UC Medications - No data to display  Initial Impression / Assessment and Plan / UC Course  I have reviewed the triage vital signs and the nursing notes.  Pertinent labs & imaging results that were available during my care of the patient were reviewed by me and considered in my medical decision making (see chart for details).   Patient is a very pleasant and nontoxic-appearing 86 year old female here for evaluation of painful urination mixed with urinary urgency, frequency, and hematuria.  Her symptoms began 2 days ago.  Patient has a longstanding history of UTIs and has been on Premarin cream to prevent them.  Patient stopped the Premarin cream on her own to see what would happen and her UTI developed.  Patient has not had any fever, nausea, or vomiting but she has had some chills.  Physical exam reveals benign cardiopulmonary exam.  Abdomen is soft, nontender and nondistended and patient does not have any CVA tenderness.  Urine collected in triage.  UA shows amber color with cloudy appearance, large hemoglobin, moderate bilirubin, 15 ketones, greater than 300 protein, nitrite positive, small leukocyte positive, greater than 50 WBCs, greater  than 50 RBCs, and rare bacteria.  Will send urine for culture.  We will discharge patient home with a diagnosis of lower urinary tract infection and will treat with Cipro as patient is allergic to Macrobid, Bactrim, and penicillins.  Patient is never taken a cephalosporin.  We will also give patient Pyridium to help with urinary discomfort.  Return and ER precautions reviewed with patient.   Final Clinical Impressions(s) / UC Diagnoses   Final diagnoses:  Lower urinary tract infectious disease     Discharge Instructions     Take the Cipro twice daily for 5 days with food for treatment of urinary tract infection.  Use the Pyridium every 8 hours as needed for urinary discomfort.  This will turn your urine a bright red-orange.  Increase your oral fluid intake so that you increase your urine production and or flushing your urinary system.  Take an over-the-counter probiotic, such as Culturelle-Align-Activia, 1 hour after each dose of antibiotic to prevent diarrhea or yeast infections from forming.  We will culture urine and change the antibiotics if necessary.  Return for reevaluation, or see your primary care provider, for any new or worsening symptoms.     ED Prescriptions    Medication Sig Dispense Auth. Provider   ciprofloxacin (CIPRO) 500 MG tablet Take 1 tablet (500 mg total) by mouth 2 (two) times daily for 5 days. 20 tablet Margarette Canada, NP   phenazopyridine (PYRIDIUM) 200 MG tablet Take 1 tablet (200 mg total) by mouth 3 (three) times daily. 6 tablet Margarette Canada, NP     PDMP  not reviewed this encounter.   Margarette Canada, NP 09/16/20 1238

## 2020-09-17 LAB — URINE CULTURE: Special Requests: NORMAL

## 2020-11-03 ENCOUNTER — Other Ambulatory Visit: Payer: Self-pay | Admitting: Internal Medicine

## 2020-11-03 DIAGNOSIS — Z1231 Encounter for screening mammogram for malignant neoplasm of breast: Secondary | ICD-10-CM

## 2020-11-19 ENCOUNTER — Other Ambulatory Visit: Payer: Self-pay

## 2020-11-19 ENCOUNTER — Ambulatory Visit
Admission: RE | Admit: 2020-11-19 | Discharge: 2020-11-19 | Disposition: A | Discharge status: Home / Self Care | Payer: Medicare Other | Source: Ambulatory Visit | Attending: Internal Medicine | Admitting: Internal Medicine

## 2020-11-19 DIAGNOSIS — Z1231 Encounter for screening mammogram for malignant neoplasm of breast: Secondary | ICD-10-CM | POA: Diagnosis not present

## 2021-02-28 ENCOUNTER — Ambulatory Visit
Admission: EM | Admit: 2021-02-28 | Discharge: 2021-02-28 | Disposition: A | Discharge status: Home / Self Care | Payer: Medicare Other | Attending: Physician Assistant | Admitting: Physician Assistant

## 2021-02-28 ENCOUNTER — Other Ambulatory Visit: Payer: Self-pay

## 2021-02-28 DIAGNOSIS — R350 Frequency of micturition: Secondary | ICD-10-CM | POA: Diagnosis present

## 2021-02-28 DIAGNOSIS — N3001 Acute cystitis with hematuria: Secondary | ICD-10-CM | POA: Diagnosis present

## 2021-02-28 LAB — POCT URINALYSIS DIP (DEVICE)
Glucose, UA: 100 mg/dL — AB
Ketones, ur: 15 mg/dL — AB
Nitrite: POSITIVE — AB
Protein, ur: >=300 mg/dL — AB
Specific Gravity, Urine: >=1.03 (ref 1.005–1.030)
Urobilinogen, UA: 1 mg/dL (ref 0.0–1.0)
pH: 5.5 (ref 5.0–8.0)

## 2021-02-28 MED ORDER — CIPROFLOXACIN HCL 250 MG PO TABS: 250.0000 mg | ORAL_TABLET | Freq: Two times a day (BID) | ORAL | 0 refills | Status: AC

## 2021-02-28 NOTE — ED Provider Notes (Signed)
[facility]    CSN: [id number] Arrival date & time: 02/28/21  1051      History   Chief Complaint Chief Complaint  Patient presents with   Urinary Frequency    HPI Tracy Long is a 86 y.o. female presenting for onset of dysuria, urinary frequency and urgency, chills and hematuria this morning.  No fever or fatigue.  There is a lower back ache that is mild.  No abdominal pain.  Patient reports history of UTIs and believes symptoms are consistent with that.  Has not taken any OTC meds for symptoms.  Last UTI was 5 months ago when she was treated with Cipro.  No other complaints.  HPI  Past Medical History:  Diagnosis Date   Arthritis    Cancer ([place])    Hypertension    Inverted nipple    Parkinson disease ([place])    Renal disorder    Thyroid disease     There are no problems to display for this patient.   Past Surgical History:  Procedure Laterality Date   ABDOMINAL HYSTERECTOMY     BACK SURGERY     BREAST EXCISIONAL BIOPSY Bilateral    [facility]   BREAST SURGERY     EYE SURGERY     JOINT REPLACEMENT     SKIN BIOPSY      OB History   No obstetric history on file.      Home Medications    Prior to Admission medications   Medication Sig Start Date End Date Taking? Authorizing Provider  ciprofloxacin (CIPRO) 250 MG tablet Take 1 tablet (250 mg total) by mouth every 12 (twelve) hours for 7 days. 02/28/21 03/07/21 Yes Danton Clap, PA-C  acetaminophen (TYLENOL) 500 MG tablet Take 500 mg by mouth every 6 (six) hours as needed.    [provider]  aspirin EC 81 MG tablet Take 81 mg by mouth daily.    [provider]  calcium carbonate (OSCAL) 1500 (600 Ca) MG TABS tablet Take by mouth 2 (two) times daily with a meal.    [provider]  carbidopa-levodopa (SINEMET IR) 25-100 MG tablet Take by mouth. 07/04/20 09/15/21  [provider]  cetirizine (ZYRTEC) 10 MG tablet Take 10 mg by mouth daily.    [provider]  cholecalciferol (VITAMIN D) 400 units TABS tablet Take 400 Units by mouth.    [provider]  conjugated estrogens (PREMARIN) vaginal cream Place 1 Applicatorful vaginally daily.    [provider]  cycloSPORINE (RESTASIS) 0.05 % ophthalmic emulsion 1 drop 2 (two) times daily.    [provider]  docusate sodium (COLACE) 100 MG capsule Take 100 mg by mouth 2 (two) times daily.    [provider]  fluticasone (VERAMYST) 27.5 MCG/SPRAY nasal spray Place 2 sprays into the nose daily.    [provider]  levothyroxine (SYNTHROID, LEVOTHROID) 75 MCG tablet Take 75 mcg by mouth daily before breakfast.    [provider]  meloxicam (MOBIC) 15 MG tablet Take 15 mg by mouth daily.    [provider]  metoprolol succinate (TOPROL-XL) 25 MG 24 hr tablet Take 25 mg by mouth daily.    [provider]  multivitamin-iron-minerals-folic acid (CENTRUM) chewable tablet Chew 1 tablet by mouth daily.    [provider]  PARoxetine (PAXIL) 10 MG tablet Take 10 mg by mouth daily.    [provider]  PROPYLENE GLYCOL OP Apply to eye.    [provider]    Family History Family History  Problem Relation Age of Onset   Breast cancer Neg Hx     Social History Social History   Tobacco Use   Smoking status: Never   Smokeless tobacco: Never  Vaping Use   Vaping Use: Never used  Substance Use Topics   Alcohol use: No   Drug use: No     Allergies   Aloe, Bactrim [sulfamethoxazole-trimethoprim], Cardizem [diltiazem], Doxycycline, Macrobid [nitrofurantoin], Penicillins, and Statins   Review of Systems Review of Systems  Constitutional:  Positive for chills. Negative for fever.  Gastrointestinal:  Negative for abdominal pain, diarrhea, nausea and vomiting.  Genitourinary:  Positive for dysuria, frequency and urgency. Negative for decreased urine volume, flank pain, hematuria, pelvic pain,  vaginal bleeding, vaginal discharge and vaginal pain.  Musculoskeletal:  Positive for back pain.  Skin:  Negative for rash.    Physical Exam Triage Vital Signs ED Triage Vitals  Enc Vitals Group     BP 02/28/21 1115 (!) 150/79     Pulse Rate 02/28/21 1115 72     Resp 02/28/21 1115 16     Temp 02/28/21 1115 98.4 F (36.9 C)     Temp Source 02/28/21 1115 Oral     SpO2 02/28/21 1115 99 %     Weight 02/28/21 1115 130 lb (59 kg)     Height 02/28/21 1115 5\' 4"  (1.626 m)     Head Circumference --      Peak Flow --      Pain Score 02/28/21 1114 0     Pain Loc --      Pain Edu? --      Excl. in [place]? --    No data found.  Updated Vital Signs BP (!) 150/79 (BP Location: Left Arm)   Pulse 72   Temp 98.4 F (36.9 C) (Oral)   Resp 16   Ht 5\' 4"  (1.626 m)   Wt 130 lb (59 kg)   SpO2 99%   BMI 22.31 kg/m     Physical Exam Vitals and nursing note reviewed.  Constitutional:      General: She is not in acute distress.    Appearance: Normal appearance. She is not ill-appearing or toxic-appearing.  HENT:     Head: Normocephalic and atraumatic.  Eyes:     General: No scleral icterus.       Right eye: No discharge.        Left eye: No discharge.     Conjunctiva/sclera: Conjunctivae normal.  Cardiovascular:     Rate and Rhythm: Normal rate and regular rhythm.     Heart sounds: Normal heart sounds.  Pulmonary:     Effort: Pulmonary effort is normal. No respiratory distress.     Breath sounds: Normal breath sounds.  Abdominal:     Palpations: Abdomen is soft.     Tenderness: There is no abdominal tenderness. There is no right CVA tenderness or left CVA tenderness.  Musculoskeletal:     Cervical back: Neck supple.  Skin:    General: Skin is dry.  Neurological:     General: No focal deficit present.     Mental Status: She is alert. Mental status is at baseline.     Motor: No weakness.     Gait: Gait normal.  Psychiatric:        Mood and Affect: Mood normal.        Behavior:  Behavior normal.  Thought Content: Thought content normal.     UC Treatments / Results  Labs (all labs ordered are listed, but only abnormal results are displayed) Labs Reviewed  POCT URINALYSIS DIP (DEVICE) - Abnormal; Notable for the following components:      Result Value   Glucose, UA 100 (*)    Bilirubin Urine SMALL (*)    Ketones, ur 15 (*)    Hgb urine dipstick LARGE (*)    Protein, ur >=300 (*)    Nitrite POSITIVE (*)    Leukocytes,Ua LARGE (*)    All other components within normal limits  URINE CULTURE    EKG   Radiology No results found.  Procedures Procedures (including critical care time)  Medications Ordered in UC Medications - No data to display  Initial Impression / Assessment and Plan / UC Course  I have reviewed the triage vital signs and the nursing notes.  Pertinent labs & imaging results that were available during my care of the patient were reviewed by me and considered in my medical decision making (see chart for details).  86 year old female presenting for onset of dysuria, chills, urinary frequency and urgency as well as hematuria this morning.  UA shows glucose, small bili, ketones, large blood, protein, positive nitrites and large leukocytes.  We will send urine for culture and treat with Cipro again.  Patient reports allergy to Bactrim, penicillins, Macrobid and doxycycline.  Advised patient we will amend treatment based on culture if needed.  Advised her to increase rest and fluids.  Reviewed ED precautions related to UTIs.  Final Clinical Impressions(s) / UC Diagnoses   Final diagnoses:  Acute cystitis with hematuria  Urinary frequency     Discharge Instructions      UTI: Based on either symptoms or urinalysis, you may have a urinary tract infection. We will send the urine for culture and call with results in a few days. Begin antibiotics at this time. Your symptoms should be much improved over the next 2-3 days. Increase rest and  fluid intake. If for some reason symptoms are worsening or not improving after a couple of days or the urine culture determines the antibiotics you are taking will not treat the infection, the antibiotics may be changed. Return or go to ER for fever, back pain, worsening urinary pain, discharge, increased blood in urine. May take Tylenol or Motrin OTC for pain relief or consider AZO if no contraindications      ED Prescriptions     Medication Sig Dispense Auth. Provider   ciprofloxacin (CIPRO) 250 MG tablet Take 1 tablet (250 mg total) by mouth every 12 (twelve) hours for 7 days. 14 tablet Gretta Cool      PDMP not reviewed this encounter.   Danton Clap, PA-C 02/28/21 1251

## 2021-02-28 NOTE — Discharge Instructions (Signed)

## 2021-02-28 NOTE — ED Triage Notes (Signed)
Urinary frequency and urgency starting today

## 2021-03-03 LAB — URINE CULTURE: Culture: 10000 — AB

## 2021-03-04 ENCOUNTER — Telehealth (HOSPITAL_COMMUNITY): Payer: Self-pay | Admitting: Emergency Medicine

## 2021-03-04 MED ORDER — FOSFOMYCIN TROMETHAMINE 3 G PO PACK: 3.0000 g | PACK | Freq: Once | ORAL | 0 refills | Status: AC

## 2021-10-27 ENCOUNTER — Other Ambulatory Visit: Payer: Self-pay | Admitting: Internal Medicine

## 2021-10-27 DIAGNOSIS — Z1231 Encounter for screening mammogram for malignant neoplasm of breast: Secondary | ICD-10-CM

## 2021-11-20 ENCOUNTER — Ambulatory Visit
Admission: RE | Admit: 2021-11-20 | Discharge: 2021-11-20 | Disposition: A | Discharge status: Home / Self Care | Payer: Medicare Other | Source: Ambulatory Visit | Attending: Internal Medicine | Admitting: Internal Medicine

## 2021-11-20 DIAGNOSIS — Z1231 Encounter for screening mammogram for malignant neoplasm of breast: Secondary | ICD-10-CM | POA: Diagnosis present

## 2022-04-24 IMAGING — MG DIGITAL SCREENING BILAT W/ TOMO W/ CAD
8 series · 9 of 24 positions shown · non-contrast
Comparison: Previous exam(s).

CLINICAL DATA: Screening.

EXAM:
DIGITAL SCREENING BILATERAL MAMMOGRAM WITH TOMO AND CAD

[L CC synth-2D]
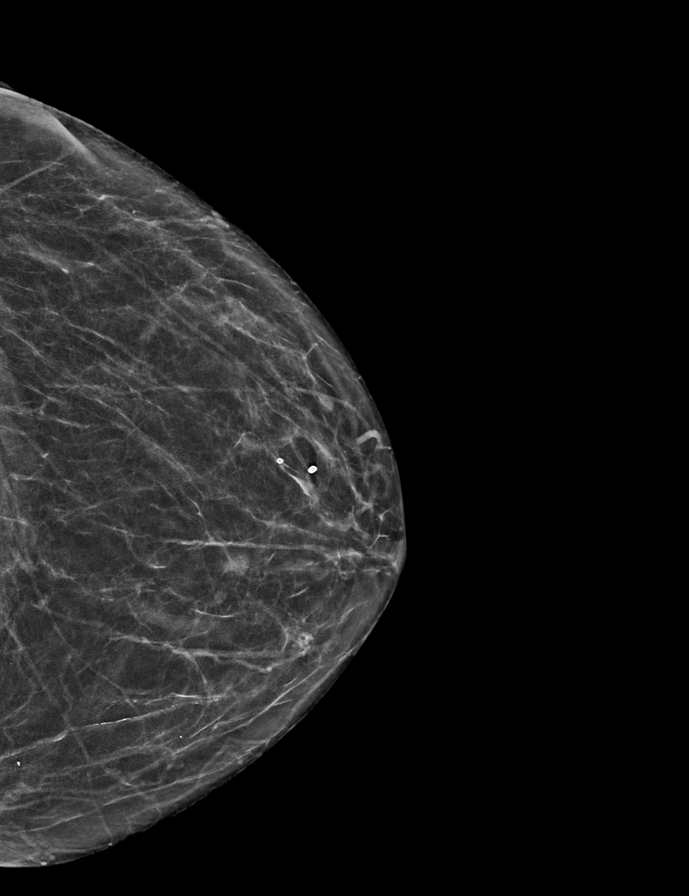

[L MLO synth-2D]
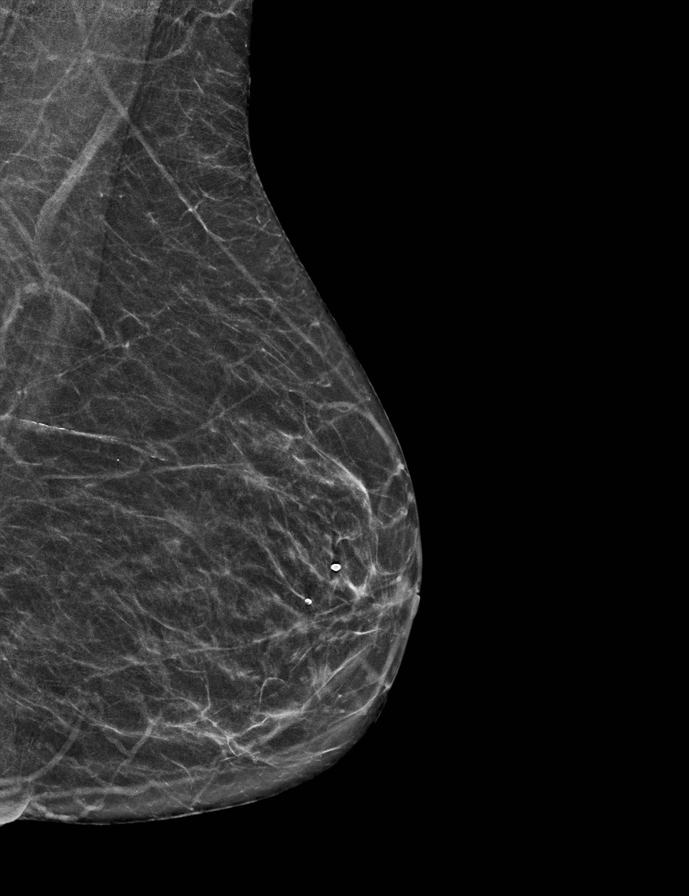

[R CC synth-2D]
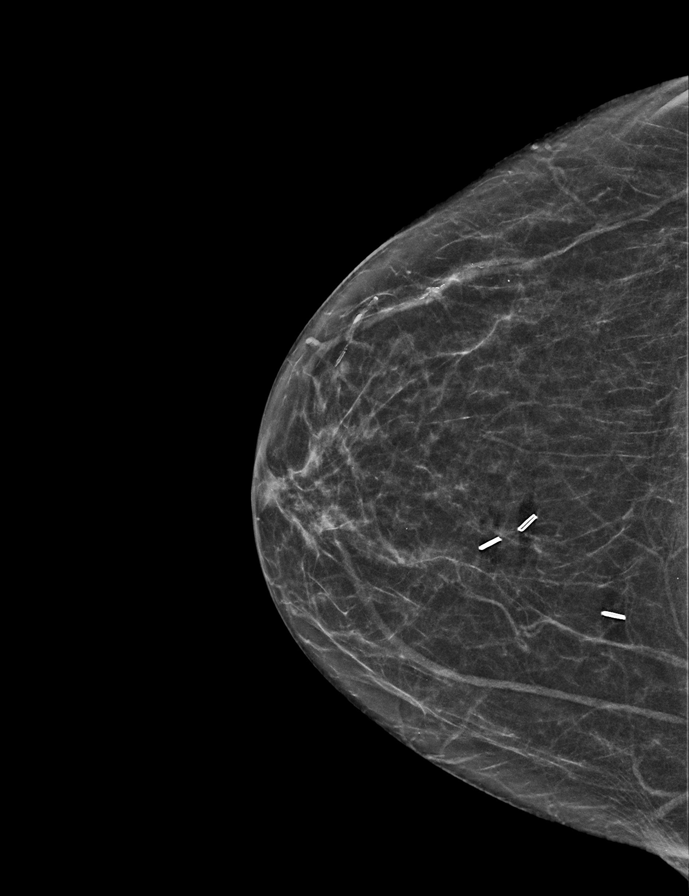

[R MLO synth-2D]
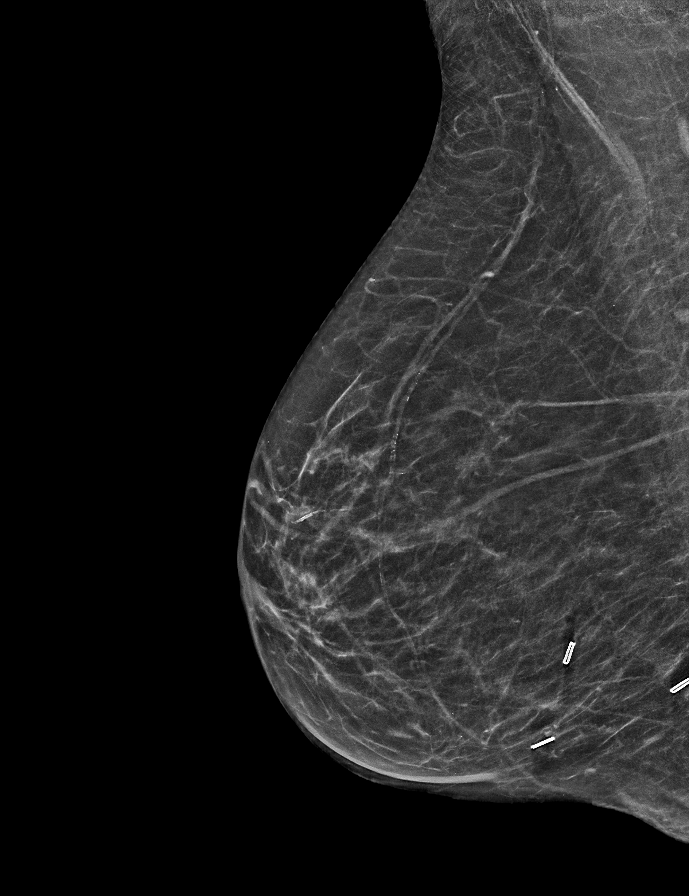

[L MLO tomo · 2 of 48 frames shown]
[frame 16/48]
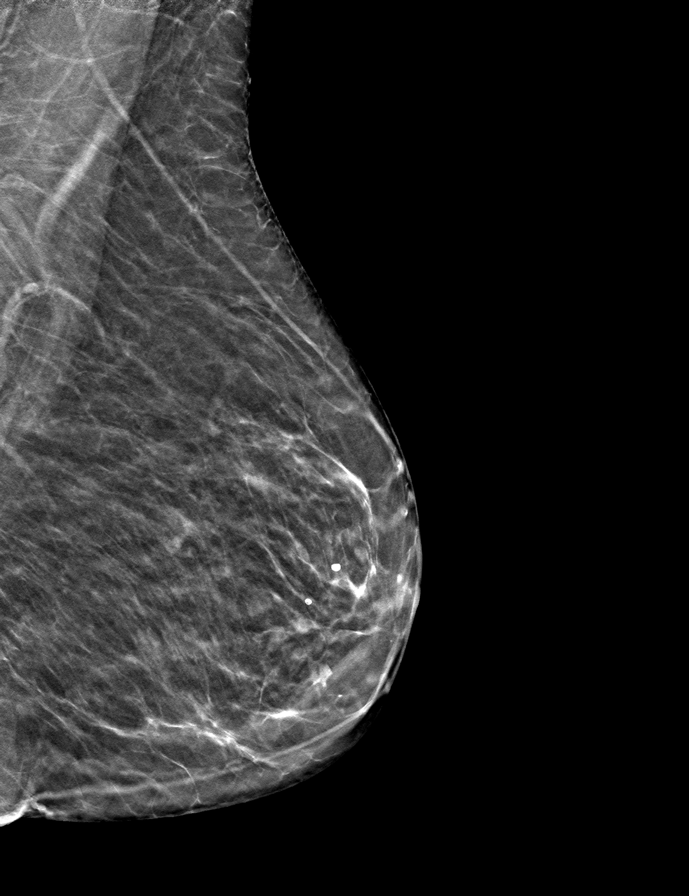
[frame 25/48]
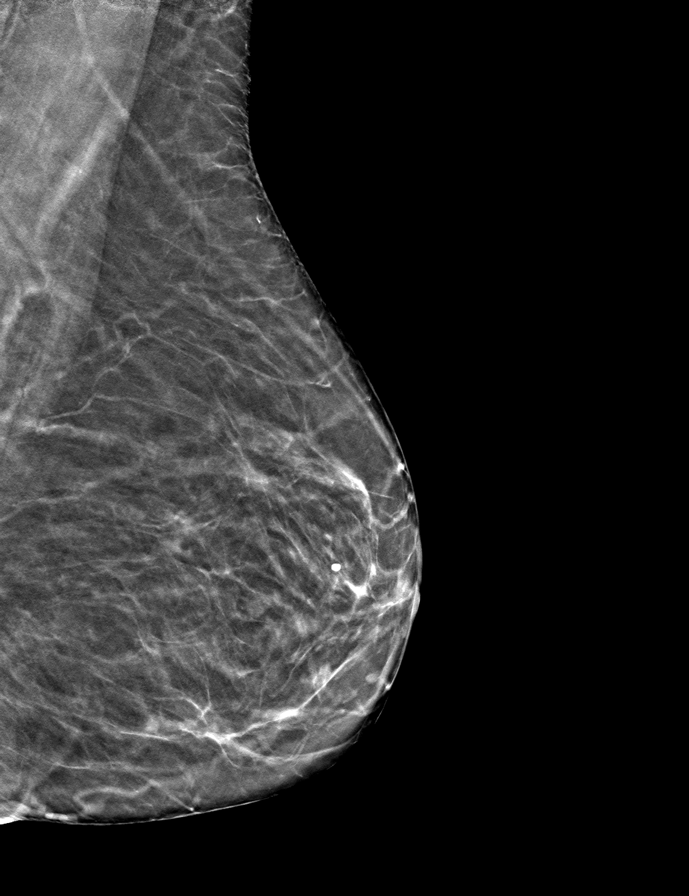

[R CC tomo · tomo slice 25/48.0]
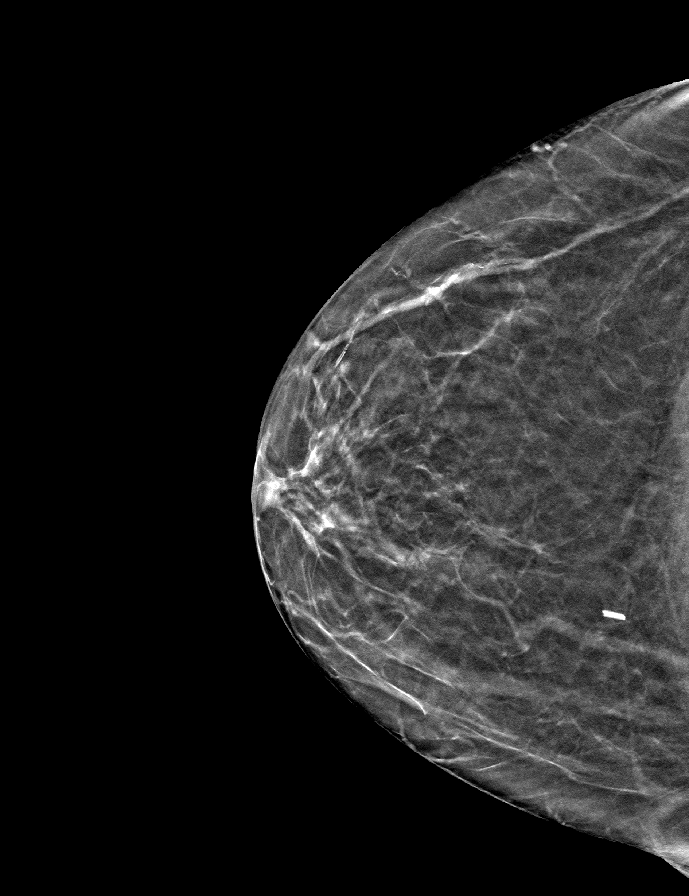

[R MLO tomo · tomo slice 25/48.0]
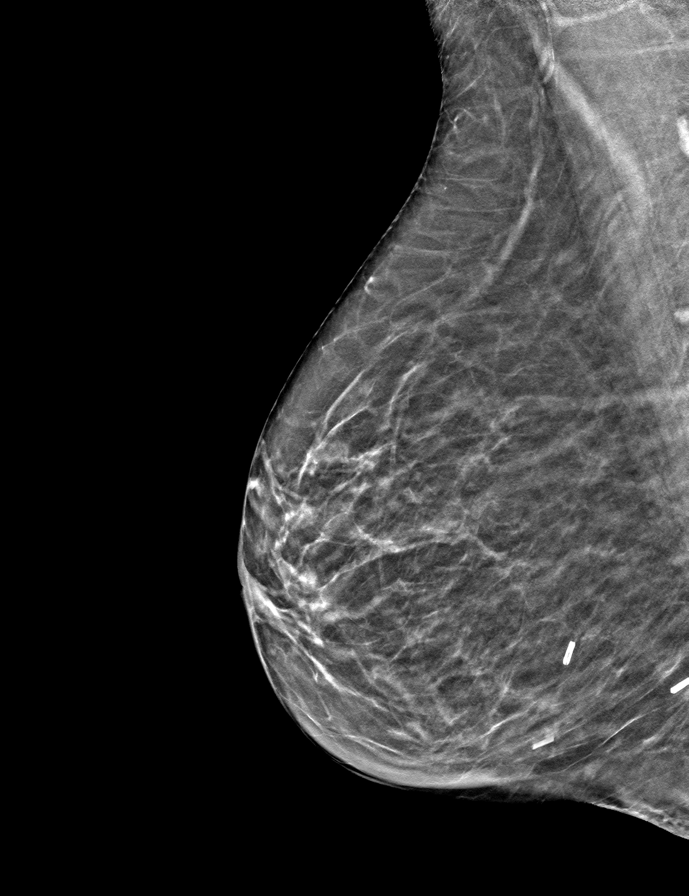

[L CC tomo · tomo slice 26/51.0]
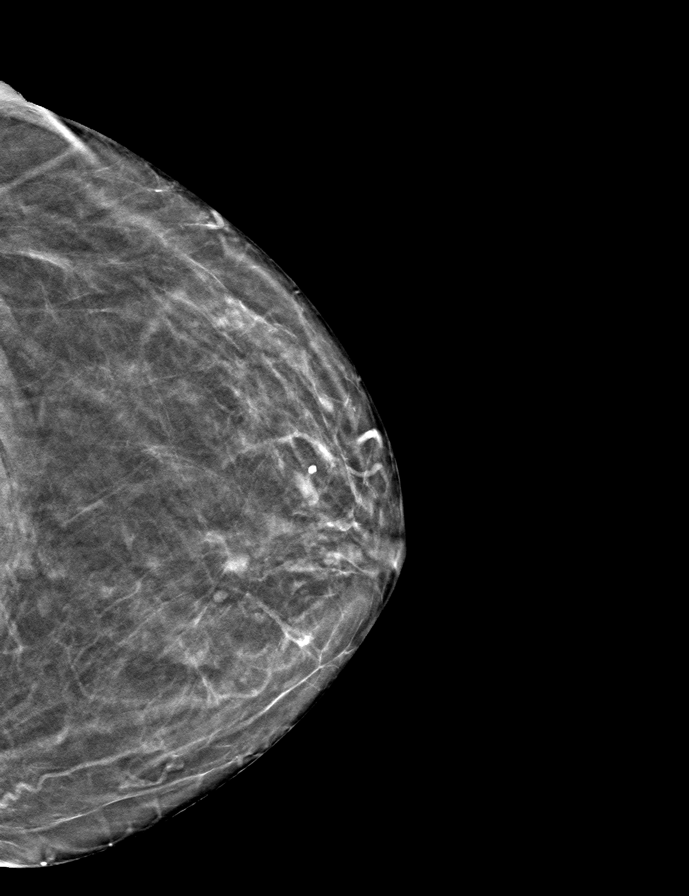

[9 of 24 positions shown; findings below may reference images not displayed]

ACR Breast Density Category b: There are scattered areas of
fibroglandular density.
FINDINGS: There are no findings suspicious for malignancy. Images were
processed with CAD.
IMPRESSION: No mammographic evidence of malignancy. A result letter of this
screening mammogram will be mailed directly to the patient.

RECOMMENDATION:
Screening mammogram in one year. (Code:[id number])

BI-RADS CATEGORY  1: Negative.

## 2022-09-12 ENCOUNTER — Other Ambulatory Visit: Payer: Self-pay

## 2022-09-12 ENCOUNTER — Encounter: Payer: Self-pay | Admitting: Emergency Medicine

## 2022-09-12 DIAGNOSIS — K921 Melena: Secondary | ICD-10-CM | POA: Diagnosis not present

## 2022-09-12 DIAGNOSIS — E876 Hypokalemia: Secondary | ICD-10-CM | POA: Diagnosis present

## 2022-09-12 DIAGNOSIS — I252 Old myocardial infarction: Secondary | ICD-10-CM

## 2022-09-12 DIAGNOSIS — K56699 Other intestinal obstruction unspecified as to partial versus complete obstruction: Secondary | ICD-10-CM | POA: Diagnosis present

## 2022-09-12 DIAGNOSIS — R944 Abnormal results of kidney function studies: Secondary | ICD-10-CM | POA: Diagnosis present

## 2022-09-12 DIAGNOSIS — I10 Essential (primary) hypertension: Secondary | ICD-10-CM | POA: Diagnosis present

## 2022-09-12 DIAGNOSIS — E611 Iron deficiency: Secondary | ICD-10-CM | POA: Diagnosis present

## 2022-09-12 DIAGNOSIS — D62 Acute posthemorrhagic anemia: Secondary | ICD-10-CM | POA: Diagnosis present

## 2022-09-12 DIAGNOSIS — Z85828 Personal history of other malignant neoplasm of skin: Secondary | ICD-10-CM

## 2022-09-12 DIAGNOSIS — Z9862 Peripheral vascular angioplasty status: Secondary | ICD-10-CM

## 2022-09-12 DIAGNOSIS — I2510 Atherosclerotic heart disease of native coronary artery without angina pectoris: Secondary | ICD-10-CM | POA: Diagnosis present

## 2022-09-12 DIAGNOSIS — Z7982 Long term (current) use of aspirin: Secondary | ICD-10-CM

## 2022-09-12 DIAGNOSIS — M8580 Other specified disorders of bone density and structure, unspecified site: Secondary | ICD-10-CM | POA: Diagnosis present

## 2022-09-12 DIAGNOSIS — Z888 Allergy status to other drugs, medicaments and biological substances status: Secondary | ICD-10-CM

## 2022-09-12 DIAGNOSIS — Z79890 Hormone replacement therapy (postmenopausal): Secondary | ICD-10-CM

## 2022-09-12 DIAGNOSIS — F32A Depression, unspecified: Secondary | ICD-10-CM | POA: Diagnosis present

## 2022-09-12 DIAGNOSIS — Z79899 Other long term (current) drug therapy: Secondary | ICD-10-CM

## 2022-09-12 DIAGNOSIS — F419 Anxiety disorder, unspecified: Secondary | ICD-10-CM | POA: Diagnosis present

## 2022-09-12 DIAGNOSIS — K5730 Diverticulosis of large intestine without perforation or abscess without bleeding: Secondary | ICD-10-CM | POA: Diagnosis present

## 2022-09-12 DIAGNOSIS — Z791 Long term (current) use of non-steroidal anti-inflammatories (NSAID): Secondary | ICD-10-CM

## 2022-09-12 DIAGNOSIS — Z881 Allergy status to other antibiotic agents status: Secondary | ICD-10-CM

## 2022-09-12 DIAGNOSIS — Z90710 Acquired absence of both cervix and uterus: Secondary | ICD-10-CM

## 2022-09-12 DIAGNOSIS — K642 Third degree hemorrhoids: Secondary | ICD-10-CM | POA: Diagnosis present

## 2022-09-12 DIAGNOSIS — E039 Hypothyroidism, unspecified: Secondary | ICD-10-CM | POA: Diagnosis present

## 2022-09-12 DIAGNOSIS — R079 Chest pain, unspecified: Secondary | ICD-10-CM | POA: Diagnosis not present

## 2022-09-12 DIAGNOSIS — Z8679 Personal history of other diseases of the circulatory system: Secondary | ICD-10-CM

## 2022-09-12 DIAGNOSIS — D72829 Elevated white blood cell count, unspecified: Secondary | ICD-10-CM | POA: Diagnosis not present

## 2022-09-12 DIAGNOSIS — K625 Hemorrhage of anus and rectum: Secondary | ICD-10-CM | POA: Diagnosis not present

## 2022-09-12 DIAGNOSIS — G20A1 Parkinson's disease without dyskinesia, without mention of fluctuations: Secondary | ICD-10-CM | POA: Diagnosis present

## 2022-09-12 DIAGNOSIS — I70202 Unspecified atherosclerosis of native arteries of extremities, left leg: Secondary | ICD-10-CM | POA: Diagnosis present

## 2022-09-12 DIAGNOSIS — M1990 Unspecified osteoarthritis, unspecified site: Secondary | ICD-10-CM | POA: Diagnosis present

## 2022-09-12 DIAGNOSIS — M353 Polymyalgia rheumatica: Secondary | ICD-10-CM | POA: Diagnosis present

## 2022-09-12 DIAGNOSIS — Z880 Allergy status to penicillin: Secondary | ICD-10-CM

## 2022-09-12 DIAGNOSIS — Z882 Allergy status to sulfonamides status: Secondary | ICD-10-CM

## 2022-09-12 DIAGNOSIS — R1319 Other dysphagia: Secondary | ICD-10-CM | POA: Diagnosis present

## 2022-09-12 LAB — COMPREHENSIVE METABOLIC PANEL
ALT: <5 U/L (ref 0–44)
AST: 19 U/L (ref 15–41)
Albumin: 3.5 g/dL (ref 3.5–5.0)
Alkaline Phosphatase: 59 U/L (ref 38–126)
Anion gap: 4 — ABNORMAL LOW (ref 5–15)
BUN: 36 mg/dL — ABNORMAL HIGH (ref 8–23)
CO2: 30 mmol/L (ref 22–32)
Calcium: 9 mg/dL (ref 8.9–10.3)
Chloride: 103 mmol/L (ref 98–111)
Creatinine, Ser: 0.82 mg/dL (ref 0.44–1.00)
GFR, Estimated: >60 mL/min (ref >60)
Glucose, Bld: 97 mg/dL (ref 70–99)
Potassium: 4.6 mmol/L (ref 3.5–5.1)
Sodium: 137 mmol/L (ref 135–145)
Total Bilirubin: 0.5 mg/dL (ref 0.3–1.2)
Total Protein: 6.6 g/dL (ref 6.5–8.1)

## 2022-09-12 LAB — TYPE AND SCREEN
ABO/RH(D): O POS
Antibody Screen: NEGATIVE

## 2022-09-12 LAB — CBC WITH DIFFERENTIAL/PLATELET
Abs Immature Granulocytes: 0.01 10*3/uL (ref 0.00–0.07)
Basophils Absolute: 0.1 10*3/uL (ref 0.0–0.1)
Basophils Relative: 1 %
Eosinophils Absolute: 0.2 10*3/uL (ref 0.0–0.5)
Eosinophils Relative: 3 %
HCT: 39.8 % (ref 36.0–46.0)
Hemoglobin: 12.7 g/dL (ref 12.0–15.0)
Immature Granulocytes: 0 %
Lymphocytes Relative: 26 %
Lymphs Abs: 1.5 10*3/uL (ref 0.7–4.0)
MCH: 29.1 pg (ref 26.0–34.0)
MCHC: 31.9 g/dL (ref 30.0–36.0)
MCV: 91.1 fL (ref 80.0–100.0)
Monocytes Absolute: 0.6 10*3/uL (ref 0.1–1.0)
Monocytes Relative: 10 %
Neutro Abs: 3.5 10*3/uL (ref 1.7–7.7)
Neutrophils Relative %: 60 %
Platelets: 250 10*3/uL (ref 150–400)
RBC: 4.37 MIL/uL (ref 3.87–5.11)
RDW: 13.2 % (ref 11.5–15.5)
WBC: 5.8 10*3/uL (ref 4.0–10.5)
nRBC: 0 % (ref 0.0–0.2)

## 2022-09-12 NOTE — ED Triage Notes (Signed)
Patient BIB [facility] c/o rectal bleeding.  Patient reports BRBPR this evening, 4 episodes in 1 hour.    99% RA 71 HR 175/95

## 2022-09-12 NOTE — ED Notes (Signed)
FIRST NURSE NOTE: Pt to ED via [facility] c/o rectal bleeding for past 1hr and 30 mins. Per EMS, pt has had 4 episodes of bright red stool with clots. Denies any pain with bowel movements.   HR 71 99% RA 175/95

## 2022-09-12 NOTE — ED Notes (Signed)
Patient had BM in triage, bright red blood with clots noted.

## 2022-09-13 ENCOUNTER — Inpatient Hospital Stay
Admission: EM | Admit: 2022-09-13 | Discharge: 2022-09-17 | DRG: 357 | Disposition: A | Discharge status: Home / Self Care | Payer: Medicare Other | Attending: Internal Medicine | Admitting: Internal Medicine

## 2022-09-13 ENCOUNTER — Encounter: Payer: Self-pay | Admitting: Internal Medicine

## 2022-09-13 ENCOUNTER — Emergency Department: Payer: Medicare Other

## 2022-09-13 DIAGNOSIS — M353 Polymyalgia rheumatica: Secondary | ICD-10-CM | POA: Diagnosis present

## 2022-09-13 DIAGNOSIS — I70202 Unspecified atherosclerosis of native arteries of extremities, left leg: Secondary | ICD-10-CM | POA: Diagnosis present

## 2022-09-13 DIAGNOSIS — K625 Hemorrhage of anus and rectum: Secondary | ICD-10-CM

## 2022-09-13 DIAGNOSIS — I771 Stricture of artery: Secondary | ICD-10-CM | POA: Diagnosis not present

## 2022-09-13 DIAGNOSIS — I10 Essential (primary) hypertension: Secondary | ICD-10-CM | POA: Diagnosis present

## 2022-09-13 DIAGNOSIS — D62 Acute posthemorrhagic anemia: Secondary | ICD-10-CM | POA: Diagnosis not present

## 2022-09-13 DIAGNOSIS — K551 Chronic vascular disorders of intestine: Secondary | ICD-10-CM | POA: Diagnosis not present

## 2022-09-13 DIAGNOSIS — Z791 Long term (current) use of non-steroidal anti-inflammatories (NSAID): Secondary | ICD-10-CM | POA: Diagnosis not present

## 2022-09-13 DIAGNOSIS — E876 Hypokalemia: Secondary | ICD-10-CM | POA: Diagnosis present

## 2022-09-13 DIAGNOSIS — Z79890 Hormone replacement therapy (postmenopausal): Secondary | ICD-10-CM | POA: Diagnosis not present

## 2022-09-13 DIAGNOSIS — F32A Depression, unspecified: Secondary | ICD-10-CM | POA: Diagnosis present

## 2022-09-13 DIAGNOSIS — Z888 Allergy status to other drugs, medicaments and biological substances status: Secondary | ICD-10-CM | POA: Diagnosis not present

## 2022-09-13 DIAGNOSIS — Z95828 Presence of other vascular implants and grafts: Secondary | ICD-10-CM | POA: Diagnosis not present

## 2022-09-13 DIAGNOSIS — K922 Gastrointestinal hemorrhage, unspecified: Secondary | ICD-10-CM | POA: Diagnosis not present

## 2022-09-13 DIAGNOSIS — Z881 Allergy status to other antibiotic agents status: Secondary | ICD-10-CM | POA: Diagnosis not present

## 2022-09-13 DIAGNOSIS — Z882 Allergy status to sulfonamides status: Secondary | ICD-10-CM | POA: Diagnosis not present

## 2022-09-13 DIAGNOSIS — Z880 Allergy status to penicillin: Secondary | ICD-10-CM | POA: Diagnosis not present

## 2022-09-13 DIAGNOSIS — K921 Melena: Secondary | ICD-10-CM | POA: Diagnosis present

## 2022-09-13 DIAGNOSIS — F419 Anxiety disorder, unspecified: Secondary | ICD-10-CM | POA: Diagnosis present

## 2022-09-13 DIAGNOSIS — Z85828 Personal history of other malignant neoplasm of skin: Secondary | ICD-10-CM | POA: Diagnosis not present

## 2022-09-13 DIAGNOSIS — Z8679 Personal history of other diseases of the circulatory system: Secondary | ICD-10-CM | POA: Diagnosis not present

## 2022-09-13 DIAGNOSIS — K55059 Acute (reversible) ischemia of intestine, part and extent unspecified: Secondary | ICD-10-CM

## 2022-09-13 DIAGNOSIS — Z9889 Other specified postprocedural states: Secondary | ICD-10-CM | POA: Diagnosis not present

## 2022-09-13 DIAGNOSIS — Z79899 Other long term (current) drug therapy: Secondary | ICD-10-CM | POA: Diagnosis not present

## 2022-09-13 DIAGNOSIS — I701 Atherosclerosis of renal artery: Secondary | ICD-10-CM | POA: Diagnosis not present

## 2022-09-13 DIAGNOSIS — G20A1 Parkinson's disease without dyskinesia, without mention of fluctuations: Secondary | ICD-10-CM | POA: Diagnosis present

## 2022-09-13 DIAGNOSIS — Z7982 Long term (current) use of aspirin: Secondary | ICD-10-CM | POA: Diagnosis not present

## 2022-09-13 DIAGNOSIS — E039 Hypothyroidism, unspecified: Secondary | ICD-10-CM | POA: Diagnosis present

## 2022-09-13 DIAGNOSIS — R1319 Other dysphagia: Secondary | ICD-10-CM | POA: Diagnosis present

## 2022-09-13 DIAGNOSIS — I2510 Atherosclerotic heart disease of native coronary artery without angina pectoris: Secondary | ICD-10-CM | POA: Diagnosis present

## 2022-09-13 DIAGNOSIS — K5730 Diverticulosis of large intestine without perforation or abscess without bleeding: Secondary | ICD-10-CM | POA: Diagnosis present

## 2022-09-13 DIAGNOSIS — K56699 Other intestinal obstruction unspecified as to partial versus complete obstruction: Secondary | ICD-10-CM | POA: Diagnosis present

## 2022-09-13 HISTORY — DX: Gastrointestinal hemorrhage, unspecified: K92.2

## 2022-09-13 LAB — CBC
HCT: 33.8 % — ABNORMAL LOW (ref 36.0–46.0)
Hemoglobin: 10.8 g/dL — ABNORMAL LOW (ref 12.0–15.0)
MCH: 29 pg (ref 26.0–34.0)
MCHC: 32 g/dL (ref 30.0–36.0)
MCV: 90.9 fL (ref 80.0–100.0)
Platelets: 217 10*3/uL (ref 150–400)
RBC: 3.72 MIL/uL — ABNORMAL LOW (ref 3.87–5.11)
RDW: 13.3 % (ref 11.5–15.5)
WBC: 6.9 10*3/uL (ref 4.0–10.5)
nRBC: 0 % (ref 0.0–0.2)

## 2022-09-13 LAB — COMPREHENSIVE METABOLIC PANEL
ALT: 10 U/L (ref 0–44)
AST: 18 U/L (ref 15–41)
Albumin: 2.4 g/dL — ABNORMAL LOW (ref 3.5–5.0)
Alkaline Phosphatase: 37 U/L — ABNORMAL LOW (ref 38–126)
Anion gap: 5 (ref 5–15)
BUN: 27 mg/dL — ABNORMAL HIGH (ref 8–23)
CO2: 22 mmol/L (ref 22–32)
Calcium: 7.4 mg/dL — ABNORMAL LOW (ref 8.9–10.3)
Chloride: 112 mmol/L — ABNORMAL HIGH (ref 98–111)
Creatinine, Ser: 0.58 mg/dL (ref 0.44–1.00)
GFR, Estimated: >60 mL/min (ref >60)
Glucose, Bld: 68 mg/dL — ABNORMAL LOW (ref 70–99)
Potassium: 3.5 mmol/L (ref 3.5–5.1)
Sodium: 139 mmol/L (ref 135–145)
Total Bilirubin: 0.5 mg/dL (ref 0.3–1.2)
Total Protein: 4.9 g/dL — ABNORMAL LOW (ref 6.5–8.1)

## 2022-09-13 LAB — HEMOGLOBIN AND HEMATOCRIT, BLOOD
HCT: 29.8 % — ABNORMAL LOW (ref 36.0–46.0)
HCT: 31.6 % — ABNORMAL LOW (ref 36.0–46.0)
HCT: 33.1 % — ABNORMAL LOW (ref 36.0–46.0)
HCT: 39.3 % (ref 36.0–46.0)
Hemoglobin: 10 g/dL — ABNORMAL LOW (ref 12.0–15.0)
Hemoglobin: 10.5 g/dL — ABNORMAL LOW (ref 12.0–15.0)
Hemoglobin: 12.1 g/dL (ref 12.0–15.0)
Hemoglobin: 9.4 g/dL — ABNORMAL LOW (ref 12.0–15.0)

## 2022-09-13 LAB — PROTIME-INR
INR: 1.1 (ref 0.8–1.2)
Prothrombin Time: 14.6 seconds (ref 11.4–15.2)

## 2022-09-13 LAB — APTT: aPTT: 30 seconds (ref 24–36)

## 2022-09-13 LAB — MAGNESIUM: Magnesium: 1.7 mg/dL (ref 1.7–2.4)

## 2022-09-13 LAB — PHOSPHORUS: Phosphorus: 2.4 mg/dL — ABNORMAL LOW (ref 2.5–4.6)

## 2022-09-13 MED ORDER — SODIUM CHLORIDE 0.9 % IV SOLN: INTRAVENOUS | Status: AC

## 2022-09-13 MED ORDER — FLUTICASONE PROPIONATE 50 MCG/ACT NA SUSP: 2.0000 | Freq: Every day | NASAL | Status: DC | PRN

## 2022-09-13 MED ORDER — SODIUM CHLORIDE 0.9 % IV BOLUS
500.0000 mL | Freq: Once | INTRAVENOUS | Status: AC
  Administered 2022-09-13: 500 mL via INTRAVENOUS

## 2022-09-13 MED ORDER — PAROXETINE HCL 10 MG PO TABS
10.0000 mg | ORAL_TABLET | Freq: Every day | ORAL | Status: DC
  Administered 2022-09-13 – 2022-09-17 (×4): 10 mg via ORAL
  Filled 2022-09-13 (×5): qty 1

## 2022-09-13 MED ORDER — MIDODRINE HCL 5 MG PO TABS
2.5000 mg | ORAL_TABLET | Freq: Three times a day (TID) | ORAL | Status: DC
  Administered 2022-09-13 (×2): 2.5 mg via ORAL
  Filled 2022-09-13 (×4): qty 1

## 2022-09-13 MED ORDER — PEG 3350-KCL-NA BICARB-NACL 420 G PO SOLR: 2000.0000 mL | Freq: Once | ORAL | Status: DC | PRN

## 2022-09-13 MED ORDER — CARBIDOPA-LEVODOPA 25-100 MG PO TABS
1.0000 | ORAL_TABLET | Freq: Four times a day (QID) | ORAL | Status: DC
  Administered 2022-09-13 – 2022-09-17 (×14): 1 via ORAL
  Filled 2022-09-13 (×17): qty 1

## 2022-09-13 MED ORDER — PEG 3350-KCL-NA BICARB-NACL 420 G PO SOLR
4000.0000 mL | Freq: Once | ORAL | Status: AC
  Administered 2022-09-13: 4000 mL via ORAL
  Filled 2022-09-13 (×2): qty 4000

## 2022-09-13 MED ORDER — IOHEXOL 350 MG/ML SOLN
100.0000 mL | Freq: Once | INTRAVENOUS | Status: AC | PRN
  Administered 2022-09-13: 100 mL via INTRAVENOUS

## 2022-09-13 MED ORDER — LORATADINE 10 MG PO TABS
10.0000 mg | ORAL_TABLET | Freq: Every day | ORAL | Status: DC
  Administered 2022-09-15 – 2022-09-17 (×3): 10 mg via ORAL
  Filled 2022-09-13 (×5): qty 1

## 2022-09-13 MED ORDER — METOPROLOL SUCCINATE ER 25 MG PO TB24
12.5000 mg | ORAL_TABLET | Freq: Every day | ORAL | Status: DC
  Administered 2022-09-15 – 2022-09-17 (×2): 12.5 mg via ORAL
  Filled 2022-09-13 (×5): qty 1

## 2022-09-13 MED ORDER — CYCLOSPORINE 0.05 % OP EMUL
1.0000 [drp] | Freq: Two times a day (BID) | OPHTHALMIC | Status: DC
  Administered 2022-09-13 – 2022-09-17 (×8): 1 [drp] via OPHTHALMIC
  Filled 2022-09-13 (×10): qty 30

## 2022-09-13 MED ORDER — LEVOTHYROXINE SODIUM 50 MCG PO TABS
50.0000 ug | ORAL_TABLET | Freq: Every day | ORAL | Status: DC
  Administered 2022-09-13 – 2022-09-17 (×4): 50 ug via ORAL
  Filled 2022-09-13 (×4): qty 1

## 2022-09-13 NOTE — ED Provider Notes (Signed)
[facility] Provider Note    Event Date/Time   First MD Initiated Contact with Patient 09/13/22 0011     (approximate)   History   Rectal Bleeding   HPI  Tracy Long is a 88 y.o. female who presents to the ED for evaluation of Rectal Bleeding   I review a PCP visit from 3/14.  Meloxicam and ASA 81 without further anticoagulation or antiplatelets.  Patient presents to the ED alongside her grand daughter for evaluation of painless rectal bleeding throughout the day today.  She reports awakening this morning with "foul rotten egg smelling gas" and then developing rectal bleeding that is painless while she was at church.  Reports soaking through her clothes onto the seat of a chair that she was on and has been persistent throughout the day.  Reports looser and mucus containing stools alongside this when she tries to pass a bowel movement today, but a "normal" bowel movement yesterday.  Denies any other bleeding such as vaginal bleeding, epistaxis or bleeding gums.  This has never happened before.  No syncope or chest pain.  Physical Exam   Triage Vital Signs: ED Triage Vitals  Enc Vitals Group     BP 09/12/22 1939 136/81     Pulse Rate 09/12/22 1939 67     Resp 09/12/22 1939 18     Temp 09/12/22 1939 98.2 F (36.8 C)     Temp Source 09/12/22 1939 Oral     SpO2 09/12/22 1939 97 %     Weight 09/12/22 1940 135 lb (61.2 kg)     Height 09/12/22 1940 5\' 7"  (1.702 m)     Head Circumference --      Peak Flow --      Pain Score 09/12/22 1940 2     Pain Loc --      Pain Edu? --      Excl. in GC? --     Most recent vital signs: Vitals:   09/13/22 0130 09/13/22 0200  BP: (!) 168/90 (!) 175/71  Pulse:  (!) 57  Resp:    Temp:    SpO2:  97%    General: Awake, no distress.  CV:  Good peripheral perfusion.  Resp:  Normal effort.  Abd:  No distention.  Poorly localizing and mild lower abdominal tenderness anteriorly.  Benign upper abdomen.  No  peritoneal features. MSK:  No deformity noted.  Neuro:  No focal deficits appreciated. Other:  Chaperoned rectal exam without anal fissures or hemorrhoids.  Skin tag is noted.  Blood around the anus and on DRE.  No apparent pain with DRE.   ED Results / Procedures / Treatments   Labs (all labs ordered are listed, but only abnormal results are displayed) Labs Reviewed  COMPREHENSIVE METABOLIC PANEL - Abnormal; Notable for the following components:      Result Value   BUN 36 (*)    Anion gap 4 (*)    All other components within normal limits  CBC WITH DIFFERENTIAL/PLATELET  HEMOGLOBIN AND HEMATOCRIT, BLOOD  PROTIME-INR  APTT  TYPE AND SCREEN    EKG   RADIOLOGY CTA GI bleed study without active extravasation  Official radiology report(s): CT ANGIO GI BLEED  Result Date: 09/13/2022 CLINICAL DATA:  Painless hematochezia EXAM: CTA ABDOMEN AND PELVIS WITHOUT AND WITH CONTRAST TECHNIQUE: Multidetector CT imaging of the abdomen and pelvis was performed using the standard protocol during bolus administration of intravenous contrast. Multiplanar reconstructed images and MIPs were obtained  and reviewed to evaluate the vascular anatomy. RADIATION DOSE REDUCTION: This exam was performed according to the departmental dose-optimization program which includes automated exposure control, adjustment of the mA and/or kV according to patient size and/or use of iterative reconstruction technique. CONTRAST:  OMNIPAQUE IOHEXOL 350 MG/ML SOLN COMPARISON:  None Available. FINDINGS: VASCULAR Aorta: Non contrasted images demonstrate no acute intramural hematoma. Nonaneurysmal aorta. No dissection is seen. Moderate aortic atherosclerosis Celiac: Patent without evidence of aneurysm, dissection, vasculitis or significant stenosis. SMA: Patent without evidence of aneurysm, dissection, vasculitis or significant stenosis. Renals: Both renal arteries are patent without evidence of dissection, vasculitis,  fibromuscular dysplasia or significant stenosis. Calcification at the origin of left renal artery but no significant stenosis. 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 IMA: Diminutive appearing inferior mesenteric artery, but appears patent on sagittal images. No occlusion or aneurysm. Inflow: Stent within the left external iliac artery which is patent. Mild aortic atherosclerosis. No aneurysm or dissection. Proximal Outflow: Bilateral common femoral and visualized portions of the superficial and profunda femoral arteries are patent without evidence of aneurysm, dissection, vasculitis or significant stenosis. Review of the MIP images confirms the above findings. NON-VASCULAR Lower chest: Lung bases demonstrate no acute airspace disease. 6 mm left anterior lung base pulmonary nodule, series 16, image 2. Hepatobiliary: No focal liver abnormality is seen. No gallstones, gallbladder wall thickening, or biliary dilatation. Pancreas: Unremarkable. No pancreatic ductal dilatation or surrounding inflammatory changes. Spleen: Normal in size without focal abnormality. Adrenals/Urinary Tract: Adrenal glands are within normal limits. Kidneys show no hydronephrosis. The bladder is unremarkable Stomach/Bowel: The stomach is nonenlarged. No dilated small bowel. Diffuse diverticular disease of the colon without acute wall thickening. Large diverticulum at the cecum. Lymphatic: No suspicious lymph nodes Reproductive: Hysterectomy.  No adnexal mass Other: Negative for pelvic effusion or free air Musculoskeletal: Treated compression deformities at L1 and L2. Suspected chronic superior endplate deformity at L4. Trace retrolisthesis L2 on L3. IMPRESSION: VASCULAR 1. Negative for active intraluminal extravasation to suggest active GI bleeding at this time. 2. Aortic atherosclerosis without evidence for dissection. 3. 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 Extensive diverticular disease of the colon  without acute inflammatory changes 2. No CT evidence for acute intra-abdominal or pelvic abnormality 3. 6 mm left lung base pulmonary nodule. Non-contrast chest CT at 6-12 months is recommended. If the nodule is stable at time of repeat CT, then future CT at 18-24 months (from today's scan) is considered optional for low-risk patients, but is recommended for high-risk patients. This recommendation follows the consensus statement: Guidelines for Management of Incidental Pulmonary Nodules Detected on CT Images: From the Fleischner Society 2017; Radiology 2017; 284:228-243. Electronically Signed   By: Jasmine Pang M.D.   On: 09/13/2022 02:02    PROCEDURES and INTERVENTIONS:  Procedures  Medications  iohexol (OMNIPAQUE) 350 MG/ML injection 100 mL (100 mLs Intravenous Contrast Given 09/13/22 0122)     IMPRESSION / MDM / ASSESSMENT AND PLAN / ED COURSE  I reviewed the triage vital signs and the nursing notes.  Differential diagnosis includes, but is not limited to, internal hemorrhoid, diverticulitis, other lower GI bleeding, brisk upper GI bleeding.  {Patient presents with symptoms of an acute illness or injury that is potentially life-threatening.  88 year old woman presents with painless lower GI bleeding.  She looks well and has obvious bright red blood per rectum on exam.  Minimal lower abdominal tenderness, but she denies any pain.  No peritoneal features.  Initial CBC with a normal hemoglobin and recheck a few hours later with a half point drop.  Slightly elevated BUN is noted.  CT is reassuring, as above.  No clear signs of diverticulitis or active extravasation.  I consult with medicine for admission to help facilitate GI evaluation and possible colonoscopy  Clinical Course as of 09/13/22 0310  Mon Sep 13, 2022  0220 Reassessed, reexamined and discussed CT results.  Discussed plan of care.  She would prefer to stay overnight for observation and see GI in the morning for possible colonoscopy,  which think is reasonable. [DS]    Clinical Course User Index [DS] Delton Prairie, MD     FINAL CLINICAL IMPRESSION(S) / ED DIAGNOSES   Final diagnoses:  Lower GI bleed     Rx / DC Orders   ED Discharge Orders     None        Note:  This document was prepared using Dragon voice recognition software and may include unintentional dictation errors.   Delton Prairie, MD 09/13/22 [phone]

## 2022-09-13 NOTE — ED Notes (Signed)
Pt provided with warm blankets and temperature in room increased per her request.

## 2022-09-13 NOTE — H&P (View-Only) (Signed)
GI Inpatient Consult Note  Reason for Consult: rectal bleeding   Attending Requesting Consult: Dr. Margo Aye  History of Present Illness: Tracy Long is a 88 y.o. female seen for evaluation of rectal bleeding at the request of Dr. Margo Aye. Past medical history significant for polymyalgia rheumatica, peripheral vascular disease, basal cell carcinoma of the forehead, hypothyroidism, chronic anxiety/depression.  Patient reports she was feeling in her normal state health until yesterday, she noticed initially in the morning having a lot of gas with a very foul odor, she then endorses having issues with frequent rectal bleeding beginning around 7 PM.  She reports between 7 PM and 11 she developed issues with bleeding about every 5 minutes.  This resolved overnight but returned this morning around 8 AM for several episodes. She reports bleeding is bright red or maroon. She is not having any issues with lower abdominal pain.  She generally moves her bowels daily and occasionally uses Colace if needed. Denies any chronic lower GI symptoms.   She denies any issues with GERD, nausea, vomiting, epigastric pain. She has some chronic dysphagia in mid esophagus to meats, breads, etc. Sometimes will have to regurgitate due to dysphagia, occurring about once a month. Denies any liquids dysphagia.  Appetite good & weight stable.  Denies nsaids, tobacco, and alcohol.   Last Colonoscopy: approximately 2000, normal.  Last Endoscopy: no recent EGD on file   Past Medical History:  Past Medical History:  Diagnosis Date   Arthritis    Cancer (HCC)    Hypertension    Inverted nipple    Parkinson disease    Renal disorder    Thyroid disease     Problem List: Patient Active Problem List   Diagnosis Date Noted   Rectal bleeding 09/13/2022    Past Surgical History: Past Surgical History:  Procedure Laterality Date   ABDOMINAL HYSTERECTOMY     BACK SURGERY     BREAST EXCISIONAL BIOPSY Bilateral     [facility]   BREAST SURGERY     EYE SURGERY     JOINT REPLACEMENT     SKIN BIOPSY      Allergies: Allergies  Allergen Reactions   Aloe Hives   Bactrim [Sulfamethoxazole-Trimethoprim] Nausea Only   Cardizem [Diltiazem] Hives   Doxycycline Hives   Macrobid [Nitrofurantoin] Hives   Penicillins Hives   Statins Other (See Comments)    Unknown reaction    Home Medications: (Not in a hospital admission)  Home medication reconciliation was completed with the patient.   Scheduled Inpatient Medications:    carbidopa-levodopa  1 tablet Oral QID   cycloSPORINE  1 drop Both Eyes BID   levothyroxine  50 mcg Oral Q0600   loratadine  10 mg Oral Daily   metoprolol succinate  12.5 mg Oral Daily   midodrine  2.5 mg Oral TID WC   PARoxetine  10 mg Oral Daily    Continuous Inpatient Infusions:    PRN Inpatient Medications:  fluticasone  Family History: family history is not on file.    Social History:   reports that she has never smoked. She has never used smokeless tobacco. She reports that she does not drink alcohol and does not use drugs.   Review of Systems: Constitutional: Weight is stable.  Eyes: No changes in vision. ENT: No oral lesions, sore throat.  GI: see HPI.  Heme/Lymph: No easy bruising.  CV: No chest pain.  GU: No hematuria.  Integumentary: No rashes.  Neuro: No headaches.  Psych: No  depression/anxiety.  Endocrine: No heat/cold intolerance.  Allergic/Immunologic: No urticaria.  Resp: No cough, SOB.  Musculoskeletal: No joint swelling.    Physical Examination: BP (!) 143/61   Pulse 61   Temp 98.2 F (36.8 C) (Oral)   Resp 18   Ht 5\' 7"  (1.702 m)   Wt 61.2 kg   SpO2 100%   BMI 21.14 kg/m  Gen: NAD, alert and oriented x 4 HEENT: PEERLA, EOMI, Neck: supple, no JVD or thyromegaly Chest: CTA bilaterally, no wheezes, crackles, or other adventitious sounds CV: RRR, no m/g/c/r Abd: soft, NT, ND, +BS in all four quadrants; no HSM, guarding, ridigity, or  rebound tenderness Ext: no edema, well perfused with 2+ pulses, Skin: no rash or lesions noted Lymph: no LAD  Data: Lab Results  Component Value Date   WBC 6.9 09/13/2022   HGB 10.8 (L) 09/13/2022   HCT 33.8 (L) 09/13/2022   MCV 90.9 09/13/2022   PLT 217 09/13/2022   Recent Labs  Lab 09/12/22 1943 09/13/22 0026 09/13/22 0600  HGB 12.7 12.1 10.8*   Lab Results  Component Value Date   NA 139 09/13/2022   K 3.5 09/13/2022   CL 112 (H) 09/13/2022   CO2 22 09/13/2022   BUN 27 (H) 09/13/2022   CREATININE 0.58 09/13/2022   Lab Results  Component Value Date   ALT 10 09/13/2022   AST 18 09/13/2022   ALKPHOS 37 (L) 09/13/2022   BILITOT 0.5 09/13/2022   Recent Labs  Lab 09/13/22 0600  APTT 30  INR 1.1      CT a/p 09/13/22-IMPRESSION: VASCULAR 1. Negative for active intraluminal extravasation to suggest active GI bleeding at this time. 2. Aortic atherosclerosis without evidence for dissection. 3. 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 Extensive diverticular disease of the colon without acute inflammatory changes 2. No CT evidence for acute intra-abdominal or pelvic abnormality 3. 6 mm left lung base pulmonary nodule. Non-contrast chest CT at 6-12 months is recommended. If the nodule is stable at time of repeat CT, then future CT at 18-24 months (from today's scan) is considered optional for low-risk patients, but is recommended for high-risk patients. This recommendation follows the consensus statement: Guidelines for Management of Incidental Pulmonary Nodules Detected on CT Images: From the Fleischner Society 2017; Radiology 2017; 284:228-243.      Assessment/Plan: Ms. Bahe is a 88 y.o. female admitted for rectal bleeding.   Rectal bleeding - acute onset yesterday, resolved overnight then returned this morning, felt likely diverticular in etiology. Patient is not on oral anticoagulation. Last colonoscopy 20+ years ago. Will plan for  colonoscopy tomorrow. Can start clear liquids today. Monitor h/h which has been stable.  Dysphagia - would recommend outpatient EGD for evaluation.  HTN Hypothyroidism Polymyalgia rheumatica    Recommendations: Colonoscopy tomorrow Clear liquid diet today Trilyte prep Monitor H/H, supportive care per primary team EGD as outpatient  Case was discussed with Dr. Timothy Lasso. Thank you for the consult. Please call with questions or concerns.  Ardelia Mems, PA-C [facility]

## 2022-09-13 NOTE — ED Notes (Signed)
Pt endorsing she has gone to the bathroom 6x from [phone] with moderate amount of bloody stools

## 2022-09-13 NOTE — Progress Notes (Signed)
Interval events noted.  She complained of multiple watery bloody stools this morning (at least 6 times).  She also found that she was feeling dizzy and faint.  No vomiting or abdominal pain. BP was 133/71 and heart rate 61, oxygen saturation 100% on room air. Normal saline bolus 500 mL has been ordered.  This to be followed by normal saline at 125 mL/h for hydration. Monitor H&H and transfuse as needed. Plan for colonoscopy tomorrow.  Follow-up with gastroenterologist.

## 2022-09-13 NOTE — Consult Note (Signed)
 GI Inpatient Consult Note  Reason for Consult: rectal bleeding   Attending Requesting Consult: Dr. Hall  History of Present Illness: Tracy Long is a 88 y.o. female seen for evaluation of rectal bleeding at the request of Dr. Hall. Past medical history significant for polymyalgia rheumatica, peripheral vascular disease, basal cell carcinoma of the forehead, hypothyroidism, chronic anxiety/depression.  Patient reports she was feeling in her normal state health until yesterday, she noticed initially in the morning having a lot of gas with a very foul odor, she then endorses having issues with frequent rectal bleeding beginning around 7 PM.  She reports between 7 PM and 11 she developed issues with bleeding about every 5 minutes.  This resolved overnight but returned this morning around 8 AM for several episodes. She reports bleeding is bright red or maroon. She is not having any issues with lower abdominal pain.  She generally moves her bowels daily and occasionally uses Colace if needed. Denies any chronic lower GI symptoms.   She denies any issues with GERD, nausea, vomiting, epigastric pain. She has some chronic dysphagia in mid esophagus to meats, breads, etc. Sometimes will have to regurgitate due to dysphagia, occurring about once a month. Denies any liquids dysphagia.  Appetite good & weight stable.  Denies nsaids, tobacco, and alcohol.   Last Colonoscopy: approximately 2000, normal.  Last Endoscopy: no recent EGD on file   Past Medical History:  Past Medical History:  Diagnosis Date   Arthritis    Cancer (HCC)    Hypertension    Inverted nipple    Parkinson disease    Renal disorder    Thyroid disease     Problem List: Patient Active Problem List   Diagnosis Date Noted   Rectal bleeding 09/13/2022    Past Surgical History: Past Surgical History:  Procedure Laterality Date   ABDOMINAL HYSTERECTOMY     BACK SURGERY     BREAST EXCISIONAL BIOPSY Bilateral     [facility]   BREAST SURGERY     EYE SURGERY     JOINT REPLACEMENT     SKIN BIOPSY      Allergies: Allergies  Allergen Reactions   Aloe Hives   Bactrim [Sulfamethoxazole-Trimethoprim] Nausea Only   Cardizem [Diltiazem] Hives   Doxycycline Hives   Macrobid [Nitrofurantoin] Hives   Penicillins Hives   Statins Other (See Comments)    Unknown reaction    Home Medications: (Not in a hospital admission)  Home medication reconciliation was completed with the patient.   Scheduled Inpatient Medications:    carbidopa-levodopa  1 tablet Oral QID   cycloSPORINE  1 drop Both Eyes BID   levothyroxine  50 mcg Oral Q0600   loratadine  10 mg Oral Daily   metoprolol succinate  12.5 mg Oral Daily   midodrine  2.5 mg Oral TID WC   PARoxetine  10 mg Oral Daily    Continuous Inpatient Infusions:    PRN Inpatient Medications:  fluticasone  Family History: family history is not on file.    Social History:   reports that she has never smoked. She has never used smokeless tobacco. She reports that she does not drink alcohol and does not use drugs.   Review of Systems: Constitutional: Weight is stable.  Eyes: No changes in vision. ENT: No oral lesions, sore throat.  GI: see HPI.  Heme/Lymph: No easy bruising.  CV: No chest pain.  GU: No hematuria.  Integumentary: No rashes.  Neuro: No headaches.  Psych: No   depression/anxiety.  Endocrine: No heat/cold intolerance.  Allergic/Immunologic: No urticaria.  Resp: No cough, SOB.  Musculoskeletal: No joint swelling.    Physical Examination: BP (!) 143/61   Pulse 61   Temp 98.2 F (36.8 C) (Oral)   Resp 18   Ht 5' 7" (1.702 m)   Wt 61.2 kg   SpO2 100%   BMI 21.14 kg/m  Gen: NAD, alert and oriented x 4 HEENT: PEERLA, EOMI, Neck: supple, no JVD or thyromegaly Chest: CTA bilaterally, no wheezes, crackles, or other adventitious sounds CV: RRR, no m/g/c/r Abd: soft, NT, ND, +BS in all four quadrants; no HSM, guarding, ridigity, or  rebound tenderness Ext: no edema, well perfused with 2+ pulses, Skin: no rash or lesions noted Lymph: no LAD  Data: Lab Results  Component Value Date   WBC 6.9 09/13/2022   HGB 10.8 (L) 09/13/2022   HCT 33.8 (L) 09/13/2022   MCV 90.9 09/13/2022   PLT 217 09/13/2022   Recent Labs  Lab 09/12/22 1943 09/13/22 0026 09/13/22 0600  HGB 12.7 12.1 10.8*   Lab Results  Component Value Date   NA 139 09/13/2022   K 3.5 09/13/2022   CL 112 (H) 09/13/2022   CO2 22 09/13/2022   BUN 27 (H) 09/13/2022   CREATININE 0.58 09/13/2022   Lab Results  Component Value Date   ALT 10 09/13/2022   AST 18 09/13/2022   ALKPHOS 37 (L) 09/13/2022   BILITOT 0.5 09/13/2022   Recent Labs  Lab 09/13/22 0600  APTT 30  INR 1.1      CT a/p 09/13/22-IMPRESSION: VASCULAR 1. Negative for active intraluminal extravasation to suggest active GI bleeding at this time. 2. Aortic atherosclerosis without evidence for dissection. 3. 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 Extensive diverticular disease of the colon without acute inflammatory changes 2. No CT evidence for acute intra-abdominal or pelvic abnormality 3. 6 mm left lung base pulmonary nodule. Non-contrast chest CT at 6-12 months is recommended. If the nodule is stable at time of repeat CT, then future CT at 18-24 months (from today's scan) is considered optional for low-risk patients, but is recommended for high-risk patients. This recommendation follows the consensus statement: Guidelines for Management of Incidental Pulmonary Nodules Detected on CT Images: From the Fleischner Society 2017; Radiology 2017; 284:228-243.      Assessment/Plan: [name] is a 88 y.o. female admitted for rectal bleeding.   Rectal bleeding - acute onset yesterday, resolved overnight then returned this morning, felt likely diverticular in etiology. Patient is not on oral anticoagulation. Last colonoscopy 20+ years ago. Will plan for  colonoscopy tomorrow. Can start clear liquids today. Monitor h/h which has been stable.  Dysphagia - would recommend outpatient EGD for evaluation.  HTN Hypothyroidism Polymyalgia rheumatica    Recommendations: Colonoscopy tomorrow Clear liquid diet today Trilyte prep Monitor H/H, supportive care per primary team EGD as outpatient  Case was discussed with Dr. Russo. Thank you for the consult. Please call with questions or concerns.  Khallid Pasillas B Soraya Paquette, PA-C [facility]  

## 2022-09-13 NOTE — H&P (Signed)
History and Physical  Tracy Long:[id number] DOB: Jun 25, 1934 DOA: 09/13/2022  Referring physician: Dr. Delton Prairie, EDP  PCP: Gracelyn Nurse, MD  Outpatient Specialists: Orthopedic surgery, radiology, dermatology. Patient coming from: Home.  Chief Complaint: Painless rectal bleeding.  HPI: Tracy Long is a 88 y.o. female with medical history significant for polymyalgia rheumatica, peripheral vascular disease, basal cell carcinoma of the forehead, hypothyroidism, chronic anxiety/depression, who presented to [facility] ED from home with complaints of painless rectal bleeding for the past 12 hours.  No prior history of GI bleed.  No known history of hemorrhoids.  Not on oral anticoagulation.  Takes an aspirin 81 mg daily at night prior to going to bed.    States around 6 AM she noticed bright red blood per rectum with clots coming out of her rectum.  She had a hysterectomy so she knew it was not coming from her vagina.  She presented to the ED at 7 AM and every 15 minutes she would have rectal bleeding.  Denies any abdominal pain or rectal pain.  Feels a little dizzy upon standing.  No use of NSAIDS.  In the ED, patient noted to have bright red blood per rectum.  Hemoglobin stable, initially 12.7, repeat 12.1.  BUN elevated at 36.  EDP requested admission for further management of her rectal bleeding.  CT angio GI bleed revealed the following findings: VASCULAR   1. Negative for active intraluminal extravasation to suggest active GI bleeding at this time. 2. Aortic atherosclerosis without evidence for dissection. 3. 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 Extensive diverticular disease of the colon without acute inflammatory changes 2. No CT evidence for acute intra-abdominal or pelvic abnormality 3. 6 mm left lung base pulmonary nodule. Non-contrast chest CT at 6-12 months is recommended. If the nodule is stable at time of repeat CT, then  future CT at 18-24 months (from today's scan) is considered optional for low-risk patients, but is recommended for high-risk patients. This recommendation follows the consensus statement: Guidelines for Management of Incidental Pulmonary Nodules Detected on CT Images: From the Fleischner Society 2017; Radiology 2017; 284:228-243.  Admitted by [facility], hospitalist service, to progressive care unit as observation status.  GI, Dr. Timothy Lasso, consulted.  ED Course: Tmax 98.1.  BP 152/63, pulse 61, respiratory 18, saturation 100% on room air.  Lab studies remarkable for WBC 5.8, hemoglobin 12.7, platelet 250.  CMP essentially unremarkable except for elevated BUN 36.  Review of Systems: Review of systems as noted in the HPI. All other systems reviewed and are negative.   Past Medical History:  Diagnosis Date   Arthritis    Cancer (HCC)    Hypertension    Inverted nipple    Parkinson disease    Renal disorder    Thyroid disease    Past Surgical History:  Procedure Laterality Date   ABDOMINAL HYSTERECTOMY     BACK SURGERY     BREAST EXCISIONAL BIOPSY Bilateral    [facility]   BREAST SURGERY     EYE SURGERY     JOINT REPLACEMENT     SKIN BIOPSY      Social History:  reports that she has never smoked. She has never used smokeless tobacco. She reports that she does not drink alcohol and does not use drugs.   Allergies  Allergen Reactions   Aloe Hives   Bactrim [Sulfamethoxazole-Trimethoprim] Nausea Only   Cardizem [Diltiazem] Hives   Doxycycline Hives  Macrobid [Nitrofurantoin] Hives   Penicillins Hives   Statins Other (See Comments)    Unknown reaction    Family History  Problem Relation Age of Onset   Breast cancer Neg Hx       Prior to Admission medications   Medication Sig Start Date End Date Taking? Authorizing Provider  acetaminophen (TYLENOL) 500 MG tablet Take 1,000 mg by mouth every 6 (six) hours as needed.   Yes [provider]  aspirin EC 81 MG tablet Take 81  mg by mouth daily.   Yes [provider]  calcium carbonate (OSCAL) 1500 (600 Ca) MG TABS tablet Take by mouth 2 (two) times daily with a meal.   Yes [provider]  carbidopa-levodopa (SINEMET IR) 25-100 MG tablet Take 1 tablet by mouth 4 (four) times daily. 07/04/20 09/13/22 Yes [provider]  cetirizine (ZYRTEC) 10 MG tablet Take 10 mg by mouth daily.   Yes [provider]  Cholecalciferol 25 MCG (1000 UT) tablet Take 1,000 Units by mouth daily.   Yes [provider]  conjugated estrogens (PREMARIN) vaginal cream Place 1 Applicatorful vaginally 3 (three) times a week.   Yes [provider]  cycloSPORINE (RESTASIS) 0.05 % ophthalmic emulsion 1 drop 2 (two) times daily.   Yes [provider]  docusate sodium (COLACE) 100 MG capsule Take 100 mg by mouth 2 (two) times daily.   Yes [provider]  fluticasone (VERAMYST) 27.5 MCG/SPRAY nasal spray Place 2 sprays into the nose daily.   Yes [provider]  levothyroxine (SYNTHROID) 50 MCG tablet Take 50 mcg by mouth daily before breakfast.   Yes [provider]  meloxicam (MOBIC) 15 MG tablet Take 15 mg by mouth daily.   Yes [provider]  metoprolol succinate (TOPROL-XL) 25 MG 24 hr tablet Take 12.5 mg by mouth daily.   Yes [provider]  midodrine (PROAMATINE) 2.5 MG tablet Take 2.5 mg by mouth 3 (three) times daily with meals.   Yes [provider]  multivitamin-iron-minerals-folic acid (CENTRUM) chewable tablet Chew 1 tablet by mouth daily.   Yes [provider]  PARoxetine (PAXIL) 10 MG tablet Take 10 mg by mouth daily.   Yes [provider]  PROPYLENE GLYCOL OP Apply to eye.   Yes [provider]    Physical Exam: BP (!) 175/71   Pulse (!) 57   Temp 98.1 F (36.7 C) (Oral)   Resp 18   Ht 5\' 7"  (1.702 m)   Wt 61.2 kg   SpO2 97%   BMI 21.14 kg/m   General: 88 y.o. year-old female well  developed well nourished in no acute distress.  Alert and oriented x3. Cardiovascular: Regular rate and rhythm with no rubs or gallops.  No thyromegaly or JVD noted.  No lower extremity edema. 2/4 pulses in all 4 extremities. Respiratory: Clear to auscultation with no wheezes or rales. Good inspiratory effort. Abdomen: Soft nontender nondistended with normal bowel sounds x4 quadrants. Muskuloskeletal: No cyanosis, clubbing or edema noted bilaterally Neuro: CN II-XII intact, strength, sensation, reflexes Skin: No ulcerative lesions noted or rashes Psychiatry: Judgement and insight appear normal. Mood is appropriate for condition and setting          Labs on Admission:  Basic Metabolic Panel: Recent Labs  Lab 09/12/22 1943  NA 137  K 4.6  CL 103  CO2 30  GLUCOSE 97  BUN 36*  CREATININE 0.82  CALCIUM 9.0   Liver Function Tests: Recent Labs  Lab 09/12/22 1943  AST 19  ALT <5  ALKPHOS 59  BILITOT 0.5  PROT 6.6  ALBUMIN 3.5   No results for input(s): "LIPASE", "AMYLASE" in the last 168 hours. No results for input(s): "AMMONIA" in the last 168 hours. CBC: Recent Labs  Lab 09/12/22 1943 09/13/22 0026  WBC 5.8  --   NEUTROABS 3.5  --   HGB 12.7 12.1  HCT 39.8 39.3  MCV 91.1  --   PLT 250  --    Cardiac Enzymes: No results for input(s): "CKTOTAL", "CKMB", "CKMBINDEX", "TROPONINI" in the last 168 hours.  BNP (last 3 results) No results for input(s): "BNP" in the last 8760 hours.  ProBNP (last 3 results) No results for input(s): "PROBNP" in the last 8760 hours.  CBG: No results for input(s): "GLUCAP" in the last 168 hours.  Radiological Exams on Admission: CT ANGIO GI BLEED  Result Date: 09/13/2022 CLINICAL DATA:  Painless hematochezia EXAM: CTA ABDOMEN AND PELVIS WITHOUT AND WITH CONTRAST TECHNIQUE: Multidetector CT imaging of the abdomen and pelvis was performed using the standard protocol during bolus administration of intravenous contrast. Multiplanar  reconstructed images and MIPs were obtained and reviewed to evaluate the vascular anatomy. RADIATION DOSE REDUCTION: This exam was performed according to the departmental dose-optimization program which includes automated exposure control, adjustment of the mA and/or kV according to patient size and/or use of iterative reconstruction technique. CONTRAST:  OMNIPAQUE IOHEXOL 350 MG/ML SOLN COMPARISON:  None Available. FINDINGS: VASCULAR Aorta: Non contrasted images demonstrate no acute intramural hematoma. Nonaneurysmal aorta. No dissection is seen. Moderate aortic atherosclerosis Celiac: Patent without evidence of aneurysm, dissection, vasculitis or significant stenosis. SMA: Patent without evidence of aneurysm, dissection, vasculitis or significant stenosis. Renals: Both renal arteries are patent without evidence of dissection, vasculitis, fibromuscular dysplasia or significant stenosis. Calcification at the origin of left renal artery but no significant stenosis. 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 IMA: Diminutive appearing inferior mesenteric artery, but appears patent on sagittal images. No occlusion or aneurysm. Inflow: Stent within the left external iliac artery which is patent. Mild aortic atherosclerosis. No aneurysm or dissection. Proximal Outflow: Bilateral common femoral and visualized portions of the superficial and profunda femoral arteries are patent without evidence of aneurysm, dissection, vasculitis or significant stenosis. Review of the MIP images confirms the above findings. NON-VASCULAR Lower chest: Lung bases demonstrate no acute airspace disease. 6 mm left anterior lung base pulmonary nodule, series 16, image 2. Hepatobiliary: No focal liver abnormality is seen. No gallstones, gallbladder wall thickening, or biliary dilatation. Pancreas: Unremarkable. No pancreatic ductal dilatation or surrounding inflammatory changes. Spleen: Normal in size without focal abnormality.  Adrenals/Urinary Tract: Adrenal glands are within normal limits. Kidneys show no hydronephrosis. The bladder is unremarkable Stomach/Bowel: The stomach is nonenlarged. No dilated small bowel. Diffuse diverticular disease of the colon without acute wall thickening. Large diverticulum at the cecum. Lymphatic: No suspicious lymph nodes Reproductive: Hysterectomy.  No adnexal mass Other: Negative for pelvic effusion or free air Musculoskeletal: Treated compression deformities at L1 and L2. Suspected chronic superior endplate deformity at L4. Trace retrolisthesis L2 on L3. IMPRESSION: VASCULAR 1. Negative for active intraluminal extravasation to suggest active GI bleeding at this time. 2. Aortic atherosclerosis without evidence for dissection. 3. 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 Extensive diverticular disease of the colon without acute inflammatory changes 2. No CT evidence for acute intra-abdominal or pelvic abnormality 3. 6 mm left lung base pulmonary nodule. Non-contrast chest  CT at 6-12 months is recommended. If the nodule is stable at time of repeat CT, then future CT at 18-24 months (from today's scan) is considered optional for low-risk patients, but is recommended for high-risk patients. This recommendation follows the consensus statement: Guidelines for Management of Incidental Pulmonary Nodules Detected on CT Images: From the Fleischner Society 2017; Radiology 2017; 284:228-243. Electronically Signed   By: Jasmine Pang M.D.   On: 09/13/2022 02:02    EKG: I independently viewed the EKG done and my findings are as followed: None available at the time of this visit.  Assessment/Plan Present on Admission:  Rectal bleeding  Principal Problem:   Rectal bleeding  Rectal bleeding, possibly diverticular bleed.   CT angio GI bleed no active bleeding but revealed extensive diverticular disease in the colon. Serial H&H every 6 hours Last hemoglobin 12.1 from 12.7, no indication  for transfusion at this time. Continue to monitor H&H every 6 hours x 3. GI, Dr. Timothy Lasso consulted.  Essential hypertension Resume home oral antihypertensives Closely monitor vital signs  Polymyalgia rheumatica Resume home regimen.  Hypothyroidism Resume home levothyroxine  Chronic anxiety/depression Resume home regimen.   DVT prophylaxis: SCDs  Code Status: Full code  Family Communication: None at bedside  Disposition Plan: Admitted to progressive care unit.  Consults called: GI.  Admission status: Observation status.   Status is: Observation    Darlin Drop MD [facility] Pager [phone]  If 7PM-7AM, please contact night-coverage [email or web address] Password [facility]  09/13/2022, 2:29 AM

## 2022-09-13 NOTE — ED Notes (Signed)
Pt up to the bedside commode. No bleeding noted. Pt back in bed. Monitor on.

## 2022-09-14 ENCOUNTER — Encounter: Payer: Self-pay | Admitting: Internal Medicine

## 2022-09-14 ENCOUNTER — Encounter
Admission: EM | Disposition: A | Discharge status: Home / Self Care | Payer: Self-pay | Source: Home / Self Care | Attending: Internal Medicine

## 2022-09-14 ENCOUNTER — Inpatient Hospital Stay: Payer: Medicare Other | Admitting: Anesthesiology

## 2022-09-14 ENCOUNTER — Inpatient Hospital Stay: Payer: Medicare Other

## 2022-09-14 DIAGNOSIS — D62 Acute posthemorrhagic anemia: Secondary | ICD-10-CM

## 2022-09-14 DIAGNOSIS — K922 Gastrointestinal hemorrhage, unspecified: Secondary | ICD-10-CM | POA: Diagnosis not present

## 2022-09-14 DIAGNOSIS — Z95828 Presence of other vascular implants and grafts: Secondary | ICD-10-CM

## 2022-09-14 DIAGNOSIS — K551 Chronic vascular disorders of intestine: Secondary | ICD-10-CM

## 2022-09-14 DIAGNOSIS — I701 Atherosclerosis of renal artery: Secondary | ICD-10-CM

## 2022-09-14 DIAGNOSIS — K625 Hemorrhage of anus and rectum: Secondary | ICD-10-CM | POA: Diagnosis not present

## 2022-09-14 DIAGNOSIS — I771 Stricture of artery: Secondary | ICD-10-CM | POA: Diagnosis not present

## 2022-09-14 HISTORY — PX: COLONOSCOPY WITH PROPOFOL: SHX5780

## 2022-09-14 HISTORY — PX: EMBOLIZATION (CATH LAB): CATH118239

## 2022-09-14 LAB — HEMOGLOBIN AND HEMATOCRIT, BLOOD
HCT: 27.1 % — ABNORMAL LOW (ref 36.0–46.0)
Hemoglobin: 8.7 g/dL — ABNORMAL LOW (ref 12.0–15.0)

## 2022-09-14 LAB — CBC
HCT: 28.2 % — ABNORMAL LOW (ref 36.0–46.0)
Hemoglobin: 9.1 g/dL — ABNORMAL LOW (ref 12.0–15.0)
MCH: 29.2 pg (ref 26.0–34.0)
MCHC: 32.3 g/dL (ref 30.0–36.0)
MCV: 90.4 fL (ref 80.0–100.0)
Platelets: 204 10*3/uL (ref 150–400)
RBC: 3.12 MIL/uL — ABNORMAL LOW (ref 3.87–5.11)
RDW: 13.3 % (ref 11.5–15.5)
WBC: 6.9 10*3/uL (ref 4.0–10.5)
nRBC: 0 % (ref 0.0–0.2)

## 2022-09-14 LAB — BASIC METABOLIC PANEL
Anion gap: 3 — ABNORMAL LOW (ref 5–15)
BUN: 21 mg/dL (ref 8–23)
CO2: 28 mmol/L (ref 22–32)
Calcium: 8.3 mg/dL — ABNORMAL LOW (ref 8.9–10.3)
Chloride: 107 mmol/L (ref 98–111)
Creatinine, Ser: 0.6 mg/dL (ref 0.44–1.00)
GFR, Estimated: >60 mL/min (ref >60)
Glucose, Bld: 94 mg/dL (ref 70–99)
Potassium: 3.8 mmol/L (ref 3.5–5.1)
Sodium: 138 mmol/L (ref 135–145)

## 2022-09-14 LAB — PROTIME-INR
INR: 1.2 (ref 0.8–1.2)
Prothrombin Time: 14.7 seconds (ref 11.4–15.2)

## 2022-09-14 SURGERY — COLONOSCOPY WITH PROPOFOL
Anesthesia: General

## 2022-09-14 SURGERY — EMBOLIZATION
Anesthesia: Moderate Sedation

## 2022-09-14 MED ORDER — FENTANYL CITRATE PF 50 MCG/ML IJ SOSY
PREFILLED_SYRINGE | INTRAMUSCULAR | Status: AC
  Filled 2022-09-14: qty 1

## 2022-09-14 MED ORDER — METHYLPREDNISOLONE SODIUM SUCC 125 MG IJ SOLR: 125.0000 mg | Freq: Once | INTRAMUSCULAR | Status: DC | PRN

## 2022-09-14 MED ORDER — FENTANYL CITRATE (PF) 100 MCG/2ML IJ SOLN
INTRAMUSCULAR | Status: DC | PRN
  Administered 2022-09-14 (×2): 25 ug via INTRAVENOUS

## 2022-09-14 MED ORDER — ACETAMINOPHEN 325 MG PO TABS: 650.0000 mg | ORAL_TABLET | Freq: Once | ORAL | Status: DC

## 2022-09-14 MED ORDER — HEPARIN SODIUM (PORCINE) 1000 UNIT/ML IJ SOLN
INTRAMUSCULAR | Status: AC
  Filled 2022-09-14: qty 10

## 2022-09-14 MED ORDER — MIDAZOLAM HCL 2 MG/2ML IJ SOLN
INTRAMUSCULAR | Status: AC
  Filled 2022-09-14: qty 2

## 2022-09-14 MED ORDER — MIDAZOLAM HCL 2 MG/ML PO SYRP: 8.0000 mg | ORAL_SOLUTION | Freq: Once | ORAL | Status: DC | PRN

## 2022-09-14 MED ORDER — VANCOMYCIN HCL IN DEXTROSE 1-5 GM/200ML-% IV SOLN
1000.0000 mg | INTRAVENOUS | Status: DC
  Administered 2022-09-14: 1000 mg via INTRAVENOUS

## 2022-09-14 MED ORDER — TECHNETIUM TC 99M-LABELED RED BLOOD CELLS IV KIT
20.0000 | PACK | Freq: Once | INTRAVENOUS | Status: AC | PRN
  Administered 2022-09-14: 21.88 via INTRAVENOUS

## 2022-09-14 MED ORDER — MIDAZOLAM HCL 2 MG/2ML IJ SOLN
INTRAMUSCULAR | Status: DC | PRN
  Administered 2022-09-14: .5 mg via INTRAVENOUS
  Administered 2022-09-14: 1 mg via INTRAVENOUS
  Administered 2022-09-14: .5 mg via INTRAVENOUS

## 2022-09-14 MED ORDER — SODIUM CHLORIDE 0.9 % IV SOLN: INTRAVENOUS | Status: DC

## 2022-09-14 MED ORDER — LIDOCAINE 2% (20 MG/ML) 5 ML SYRINGE
INTRAMUSCULAR | Status: DC | PRN
  Administered 2022-09-14: 50 mg via INTRAVENOUS

## 2022-09-14 MED ORDER — HYDROMORPHONE HCL 1 MG/ML IJ SOLN: 1.0000 mg | Freq: Once | INTRAMUSCULAR | Status: DC | PRN

## 2022-09-14 MED ORDER — DIPHENHYDRAMINE HCL 50 MG/ML IJ SOLN: 50.0000 mg | Freq: Once | INTRAMUSCULAR | Status: DC | PRN

## 2022-09-14 MED ORDER — PROPOFOL 500 MG/50ML IV EMUL
INTRAVENOUS | Status: DC | PRN
  Administered 2022-09-14: 100 ug/kg/min via INTRAVENOUS

## 2022-09-14 MED ORDER — IODIXANOL 320 MG/ML IV SOLN
INTRAVENOUS | Status: DC | PRN
  Administered 2022-09-14: 35 mL via INTRA_ARTERIAL

## 2022-09-14 MED ORDER — VANCOMYCIN HCL IN DEXTROSE 1-5 GM/200ML-% IV SOLN
INTRAVENOUS | Status: AC
  Filled 2022-09-14: qty 200

## 2022-09-14 MED ORDER — SODIUM CHLORIDE 0.9 % IV SOLN: INTRAVENOUS | Status: AC

## 2022-09-14 MED ORDER — ONDANSETRON HCL 4 MG/2ML IJ SOLN
4.0000 mg | Freq: Once | INTRAMUSCULAR | Status: AC
  Administered 2022-09-14: 4 mg via INTRAVENOUS
  Filled 2022-09-14: qty 2

## 2022-09-14 MED ORDER — ACETAMINOPHEN 10 MG/ML IV SOLN
1000.0000 mg | Freq: Once | INTRAVENOUS | Status: AC
  Administered 2022-09-14: 1000 mg via INTRAVENOUS
  Filled 2022-09-14: qty 100

## 2022-09-14 MED ORDER — ONDANSETRON HCL 4 MG/2ML IJ SOLN: 4.0000 mg | Freq: Four times a day (QID) | INTRAMUSCULAR | Status: DC | PRN

## 2022-09-14 MED ORDER — PROPOFOL 10 MG/ML IV BOLUS
INTRAVENOUS | Status: DC | PRN
  Administered 2022-09-14 (×2): 20 mg via INTRAVENOUS
  Administered 2022-09-14: 50 mg via INTRAVENOUS

## 2022-09-14 MED ORDER — FAMOTIDINE 20 MG PO TABS: 40.0000 mg | ORAL_TABLET | Freq: Once | ORAL | Status: DC | PRN

## 2022-09-14 MED ORDER — FENTANYL CITRATE (PF) 100 MCG/2ML IJ SOLN: 12.5000 ug | Freq: Once | INTRAMUSCULAR | Status: DC | PRN

## 2022-09-14 SURGICAL SUPPLY — 17 items
CATH ANGIO 5F PIGTAIL 65CM (CATHETERS) IMPLANT
CATH MICROCATH PRGRT 2.8F 110 (CATHETERS) IMPLANT
CATH VS15FR (CATHETERS) IMPLANT
COIL 400 COMPLEX SOFT 2X2CM (Vascular Products) IMPLANT
COIL 400 COMPLEX SOFT 2X4CM (Vascular Products) IMPLANT
COVER PROBE ULTRASOUND 5X96 (MISCELLANEOUS) IMPLANT
DEVICE STARCLOSE SE CLOSURE (Vascular Products) IMPLANT
GLIDEWIRE STIFF .35X180X3 HYDR (WIRE) IMPLANT
HANDLE DETACHMENT COIL (MISCELLANEOUS) IMPLANT
MICROCATH PROGREAT 2.8F 110 CM (CATHETERS) ×1
PACK ANGIOGRAPHY (CUSTOM PROCEDURE TRAY) ×1 IMPLANT
SHEATH BRITE TIP 5FRX11 (SHEATH) IMPLANT
SYR EMBOSPHERE 500-700 2ML (Embolic) ×1 IMPLANT
SYR MEDRAD MARK 7 150ML (SYRINGE) IMPLANT
SYRINGE EMBOSPHERE 500-700 2ML (Embolic) IMPLANT
TUBING CONTRAST HIGH PRESS 72 (TUBING) IMPLANT
WIRE GUIDERIGHT .035X150 (WIRE) IMPLANT

## 2022-09-14 NOTE — Transfer of Care (Signed)
Immediate Anesthesia Transfer of Care Note  Patient: Tracy Long  Procedure(s) Performed: COLONOSCOPY WITH PROPOFOL  Patient Location: Endoscopy Unit  Anesthesia Type:General  Level of Consciousness: awake, alert , and oriented  Airway & Oxygen Therapy: Patient Spontanous Breathing  Post-op Assessment: Report given to RN and Post -op Vital signs reviewed and stable  Post vital signs: Reviewed  Last Vitals:  Vitals Value Taken Time  BP 99/84 09/14/22 1313  Temp 36.6 09/14/22 1311  Pulse 36 09/14/22 1313  Resp 16 09/14/22 1314  SpO2 97 09/14/22 1313  Vitals shown include unvalidated device data.  Last Pain:  Vitals:   09/14/22 1311  TempSrc: Temporal  PainSc: 0-No pain         Complications: No notable events documented.

## 2022-09-14 NOTE — Anesthesia Postprocedure Evaluation (Signed)
Anesthesia Post Note  Patient: Tracy Long  Procedure(s) Performed: COLONOSCOPY WITH PROPOFOL  Patient location during evaluation: Endoscopy Anesthesia Type: General Level of consciousness: awake and alert Pain management: pain level controlled Vital Signs Assessment: post-procedure vital signs reviewed and stable Respiratory status: spontaneous breathing, nonlabored ventilation, respiratory function stable and patient connected to nasal cannula oxygen Cardiovascular status: blood pressure returned to baseline and stable Postop Assessment: no apparent nausea or vomiting Anesthetic complications: no   No notable events documented.   Last Vitals:  Vitals:   09/14/22 1341 09/14/22 1342  BP: (!) 137/94 110/75  Pulse: 71 69  Resp: 14 (!) 21  Temp:    SpO2: 100% 100%    Last Pain:  Vitals:   09/14/22 1342  TempSrc:   PainSc: 0-No pain                 Corinda Gubler

## 2022-09-14 NOTE — Interval H&P Note (Signed)
History and Physical Interval Note: Consult note from 09/13/22 was reviewed and there was no interval change after seeing and examining the patient. Labs reviewed and remain stable. NPO since midnight. Completed prep overnight. No further hematochezia. Written consent was obtained from the patient after discussion of risks, benefits, and alternatives. Patient has consented to proceed with Colonoscopy with possible intervention  Colonoscopy with possible biopsy, control of bleeding, polypectomy, and interventions as necessary has been discussed with the patient/patient representative. Informed consent was obtained from the patient/patient representative after explaining the indication, nature, and risks of the procedure including but not limited to death, bleeding, perforation, missed neoplasm/lesions, cardiorespiratory compromise, and reaction to medications. Opportunity for questions was given and appropriate answers were provided. Patient/patient representative has verbalized understanding is amenable to undergoing the procedure.   09/14/2022 12:11 PM  Tracy Long  has presented today for surgery, with the diagnosis of hematochezia.  The various methods of treatment have been discussed with the patient and family. After consideration of risks, benefits and other options for treatment, the patient has consented to  Procedure(s): COLONOSCOPY WITH PROPOFOL (N/A) as a surgical intervention.  The patient's history has been reviewed, patient examined, no change in status, stable for surgery.  I have reviewed the patient's chart and labs.  Questions were answered to the patient's satisfaction.     Jaynie Collins

## 2022-09-14 NOTE — Op Note (Signed)
[facility]  Percutaneous Study/Intervention Procedural Note     Surgeon(s): [employer]   Assistants: none  Pre-operative Diagnosis: 1. Lower GI bleed   Post-operative diagnosis:  Same  Procedure(s) Performed:             1.  Ultrasound guidance for vascular access right femoral artery             2.  Catheter placement into the left colonic branch of the IMA as well as a splenic flexure branch of the transverse colonic branch of the SMA             3.  Aortogram and selective angiogram of the IMA and the left colonic branch of the IMA as well as selective angiogram of the SMA and the splenic flexure branch off of the transverse colonic branch of the SMA             4.  Microbead embolization of left colonic branch of the IMA with 1 cc of 500-700  polyvinyl alcohol beads as well as microbead embolization of the splenic flexure branch off of the SMA with a total of 2.5 cc of 500 to 700 m polyvinyl alcohol beads  5.  Coil embolization with two 2 mm Ruby coils to the splenic flexure branch off of the left colonic branch of the IMA             6.  StarClose closure device right femoral artery  Anesthesia: Moderate conscious sedation for approximately 30 to minutes using 2 mg of Versed and 50 mcg of Fentanyl              EBL: 5 cc  Fluoro Time: 8.2 minutes  Contrast: 35 cc              Indications:  Patient is a 88 y.o.female with brisk lower GI bleeding with resultant anemia. The patient has a nuclear medicine study showing brisk bleeding from the splenic flexure and left colonic region. The patient is brought in for angiography for further evaluation and potential treatment. Risks and benefits are discussed and informed consent is obtained  Procedure:  The patient was identified and appropriate procedural time out was performed.  The patient was then placed supine on the table and prepped and draped in the usual sterile fashion. Moderate conscious sedation was  administered during a face to face encounter with the patient throughout the procedure with my supervision of the RN administering medicines and monitoring the patient's vital signs, pulse oximetry, telemetry and mental status throughout from the start of the procedure until the patient was taken to the recovery room.  Ultrasound was used to evaluate the right common femoral artery.  It was patent .  A digital ultrasound image was acquired.  A Seldinger needle was used to access the right common femoral artery under direct ultrasound guidance and a permanent image was performed.  A 0.035 J wire was advanced without resistance and a 5Fr sheath was placed.  Pigtail catheter was placed into the aorta and an AP aortogram was performed. This demonstrated patency of the aorta and iliac arteries including 2 previously placed iliac artery stents.  The renal arteries were open but there was some level disease more on the left than the right.  The IMA could be seen but the origin was not well-seen in the AP projection.  The celiac and SMA could also not be well-seen in the AP projection.  I went to an  RAO projection and used a V S1 catheter and evaluated the IMA.  This was heavily diseased proximally.  Using a V S1 catheter and then a prograde microcatheter I was able to cannulate the IMA.  Selective imaging was performed first through the V S1 catheter showing a heavily diseased vessel.  There was a splenic flexure branch coming off of a left colonic branch which was also quite calcific, small and tortuous.  Using the prograde microcatheter was able to get out to the distal endpoint of this left colonic branch of the IMA and selective imaging is performed.  There was a splenic flexure branch that was up going and I attempted to cannulate this but the vessel was small and diseased and some area of extravasation was seen.  1 cc of 500 to 700 m polyvinyl alcohol beads were distributed in the left colonic branch just proximal  to the splenic flexure.  Due to the extravasation in this area, I elected to place a small Ruby coil to stop the inflow.  A 2 mm diameter by 2 cm length Ruby coil was selected and deployed and this stopped the extravasation in that area.  This should treat the area in the left colon as well as some reduction of flow through the splenic flexure, but to more directly address the splenic flexure, I elected to cannulate the SMA for treatment as well.  We transitioned to the lateral projection to cannulate the SMA. A V S1 catheter was used to selectively cannulate the SMA.  This demonstrated fairly normal vessel with a large transverse colonic branch feeding off 2 branches going directly to the splenic flexure.  This came off about 4 to 5 cm from the origin of the SMA from a left anterior lateral projection.  I then was able to cannulate this main transverse colonic branch with a prograde microcatheter and advanced this out just before the bifurcation to this to splenic flexure branches. Based on her continued bleeding and the nuclear medicine study I elected to treat this area with embolization. I initially advanced the Pro-Great microcatheter out the more superior of the 2 branches and advanced out right to the splenic flexure and instilled approximately 1.5 cc of 500 to 700 m polyvinyl alcohol beads in this location. Angiogram following this showed the main vessels to be open with less brisk filling. I then pulled back and cannulated the more inferior of the 2 branches and this advanced to the proximal left colon just below the splenic flexure.  This was cannulated with the Pro-great microcatheter without difficulty. Selective imaging was performed which showed significant brisk flow in this area.  An additional 1 cc of 500 to 700 m polyvinyl alcohol beads were deployed in the lower of the 2 splenic flexure branches. Again, completion angiogram showed the main vessels to be open with less brisk filling.  Also, the  lower of the 2 splenic flexure branches had the left colonic branch that fed downward to the area that had extravasation was treated with embolization from below.  I elected to advance the prograde microcatheter down there to place an additional Ruby coil to stop any backbleeding into that area that had extravasation.  This was done without difficulty with the prograde microcatheter down the left colon within about 5 to 6 cm of the coil that had been placed proximal to the area.  I then placed a 2 mm diameter by 4 cm length Ruby coil in this area to completely exclude this artery  and treat the area of extravasation.  At this point, we had markedly reduced the blood flow to the splenic flexure as well as somewhat reduce the flow to the left colon within the small vessels but the main arteries remained largely open to try to avoid ischemia.  I elected to terminate the procedure. The diagnostic catheter was removed. StarClose closure device was deployed in usual fashion with excellent hemostatic result. The patient was taken to the recovery room in stable condition having tolerated the procedure well.       Disposition: Patient was taken to the recovery room in stable condition having tolerated the procedure well.  Complications:  None  Festus Barren 09/14/2022 6:07 PM   This note was created with Dragon Medical transcription system. Any errors in dictation are purely unintentional.

## 2022-09-14 NOTE — Consult Note (Signed)
Hospital Consult    Reason for Consult:  Splenic Flexure GI Bleed Requesting Physician:  Dr Elfredia Nevins MD MRN #:  [id number]  History of Present Illness: This is a 88 y.o. female with medical history significant for polymyalgia rheumatica, Parkinson's disease, peripheral vascular disease with PCI to the left femoral artery, CAD with previous MI, remote history of atrial fibrillation s/p ablation in 2007, basal cell carcinoma of the forehead, hypothyroidism, chronic anxiety/depression, who presented to the emergency department with painless rectal bleeding.  She has no history of GI bleed.  She takes a daily aspirin and meloxicam at home.     She was admitted to the hospital for acute lower GI bleeding complicated by acute blood loss anemia.  She was treated with IV fluids.  Patient had a colonoscopy earlier today.  There was no clear source of active bleeding.  Therefore patient was sent to nuclear medicine this afternoon.  Nuclear scan shows an active bleed at the splenic flexure of the colon.  Skin surgery was consulted for embolization.  Patient is hemodynamically stable but symptomatic.  Patient states that when she stands up she gets dizzy lightheaded and feels as if she is going to pass out.  Patient denies any chest pain or shortness of breath at this point time.  Patient denies any abdominal pain.  Past Medical History:  Diagnosis Date   Arthritis    Cancer (HCC)    breast Rt   Hypertension    Inverted nipple    Parkinson disease    Renal disorder    Thyroid disease     Past Surgical History:  Procedure Laterality Date   ABDOMINAL HYSTERECTOMY     BACK SURGERY     BREAST EXCISIONAL BIOPSY Bilateral    [facility]   BREAST SURGERY     EYE SURGERY     JOINT REPLACEMENT Left    Knee   SKIN BIOPSY      Allergies  Allergen Reactions   Aloe Hives   Bactrim [Sulfamethoxazole-Trimethoprim] Nausea Only   Cardizem [Diltiazem] Hives   Doxycycline Hives   Macrobid [Nitrofurantoin]  Hives   Penicillins Hives   Statins Other (See Comments)    Unknown reaction    Prior to Admission medications   Medication Sig Start Date End Date Taking? Authorizing Provider  acetaminophen (TYLENOL) 500 MG tablet Take 1,000 mg by mouth every 6 (six) hours as needed.   Yes [provider]  aspirin EC 81 MG tablet Take 81 mg by mouth daily.   Yes [provider]  calcium carbonate (OSCAL) 1500 (600 Ca) MG TABS tablet Take by mouth 2 (two) times daily with a meal.   Yes [provider]  carbidopa-levodopa (SINEMET IR) 25-100 MG tablet Take 1 tablet by mouth 4 (four) times daily. 07/04/20 09/13/22 Yes [provider]  cetirizine (ZYRTEC) 10 MG tablet Take 10 mg by mouth daily.   Yes [provider]  Cholecalciferol 25 MCG (1000 UT) tablet Take 1,000 Units by mouth daily.   Yes [provider]  conjugated estrogens (PREMARIN) vaginal cream Place 1 Applicatorful vaginally 3 (three) times a week.   Yes [provider]  cycloSPORINE (RESTASIS) 0.05 % ophthalmic emulsion 1 drop 2 (two) times daily.   Yes [provider]  docusate sodium (COLACE) 100 MG capsule Take 100 mg by mouth 2 (two) times daily.   Yes [provider]  fluticasone (VERAMYST) 27.5 MCG/SPRAY nasal spray Place 2 sprays into the nose daily.  Yes [provider]  levothyroxine (SYNTHROID) 50 MCG tablet Take 50 mcg by mouth daily before breakfast.   Yes [provider]  meloxicam (MOBIC) 15 MG tablet Take 15 mg by mouth daily.   Yes [provider]  metoprolol succinate (TOPROL-XL) 25 MG 24 hr tablet Take 12.5 mg by mouth daily.   Yes [provider]  midodrine (PROAMATINE) 2.5 MG tablet Take 2.5 mg by mouth 3 (three) times daily with meals.   Yes [provider]  multivitamin-iron-minerals-folic acid (CENTRUM) chewable tablet Chew 1 tablet by mouth daily.   Yes [provider]  PARoxetine (PAXIL) 10  MG tablet Take 10 mg by mouth daily.   Yes [provider]  PROPYLENE GLYCOL OP Apply to eye.   Yes [provider]    Social History   Socioeconomic History   Marital status: Widowed    Spouse name: Not on file   Number of children: Not on file   Years of education: Not on file   Highest education level: Not on file  Occupational History   Not on file  Tobacco Use   Smoking status: Never   Smokeless tobacco: Never  Vaping Use   Vaping Use: Never used  Substance and Sexual Activity   Alcohol use: No   Drug use: No   Sexual activity: Not on file  Other Topics Concern   Not on file  Social History Narrative   Not on file   Social Determinants of Health   Financial Resource Strain: Not on file  Food Insecurity: No Food Insecurity (09/13/2022)   Hunger Vital Sign    Worried About Running Out of Food in the Last Year: Never true    Ran Out of Food in the Last Year: Never true  Transportation Needs: No Transportation Needs (09/13/2022)   PRAPARE - Administrator, Civil Service (Medical): No    Lack of Transportation (Non-Medical): No  Physical Activity: Not on file  Stress: Not on file  Social Connections: Not on file  Intimate Partner Violence: Not At Risk (09/13/2022)   Humiliation, Afraid, Rape, and Kick questionnaire    Fear of Current or Ex-Partner: No    Emotionally Abused: No    Physically Abused: No    Sexually Abused: No     Family History  Problem Relation Age of Onset   Breast cancer Neg Hx     ROS: Otherwise negative unless mentioned in HPI  Physical Examination  Vitals:   09/14/22 1540 09/14/22 1550  BP: (!) 168/71 (!) 177/66  Pulse:    Resp:    Temp:    SpO2:     Body mass index is 21.14 kg/m.  General:  WDWN in NAD Gait: Not observed HENT: WNL, normocephalic Pulmonary: normal non-labored breathing, without Rales, rhonchi,  wheezing Cardiac: regular, without  Murmurs, rubs or gallops; without carotid  bruits Abdomen: Positive bowel sounds, soft, NT/ND, no masses Skin: without rashes Vascular Exam/Pulses: Palpable pulse throughout Extremities: without ischemic changes, without Gangrene , without cellulitis; without open wounds;  Musculoskeletal: no muscle wasting or atrophy  Neurologic: A&O X 3;  No focal weakness or paresthesias are detected; speech is fluent/normal Psychiatric:  The pt has Normal affect. Lymph:  Unremarkable  CBC    Component Value Date/Time   WBC 6.9 09/14/2022 0515   RBC 3.12 (L) 09/14/2022 0515   HGB 9.1 (L) 09/14/2022 0515   HGB 8.5 (L) 05/25/2012 0907   HCT 28.2 (L) 09/14/2022 0515  HCT 21.3 (L) 05/24/2012 0921   PLT 204 09/14/2022 0515   PLT 220 05/24/2012 0921   MCV 90.4 09/14/2022 0515   MCV 88 05/24/2012 0921   MCH 29.2 09/14/2022 0515   MCHC 32.3 09/14/2022 0515   RDW 13.3 09/14/2022 0515   RDW 15.2 (H) 05/24/2012 0921   LYMPHSABS 1.5 09/12/2022 1943   LYMPHSABS 2.1 05/24/2012 0921   MONOABS 0.6 09/12/2022 1943   MONOABS 1.2 (H) 05/24/2012 0921   EOSABS 0.2 09/12/2022 1943   EOSABS 0.2 05/24/2012 0921   BASOSABS 0.1 09/12/2022 1943   BASOSABS 0.1 05/24/2012 0921    BMET    Component Value Date/Time   NA 138 09/14/2022 0515   NA 141 05/23/2012 0416   K 3.8 09/14/2022 0515   K 3.8 05/23/2012 0416   CL 107 09/14/2022 0515   CL 106 05/23/2012 0416   CO2 28 09/14/2022 0515   CO2 30 05/23/2012 0416   GLUCOSE 94 09/14/2022 0515   GLUCOSE 91 05/23/2012 0416   BUN 21 09/14/2022 0515   BUN 18 05/23/2012 0416   CREATININE 0.60 09/14/2022 0515   CREATININE 0.53 (L) 05/23/2012 0416   CALCIUM 8.3 (L) 09/14/2022 0515   CALCIUM 8.1 (L) 05/23/2012 0416   GFRNONAA >60 09/14/2022 0515   GFRNONAA >60 05/23/2012 0416   GFRAA >60 05/23/2012 0416    COAGS: Lab Results  Component Value Date   INR 1.2 09/14/2022   INR 1.1 09/13/2022   INR 1.0 05/23/2012     Non-Invasive Vascular Imaging:   EXAM: NUCLEAR MEDICINE GASTROINTESTINAL BLEEDING  SCAN   TECHNIQUE: Sequential abdominal images were obtained following intravenous administration of Tc-52m labeled red blood cells.   RADIOPHARMACEUTICALS:  21.88 mCi Tc-79m pertechnetate in-vitro labeled red cells.   COMPARISON:  09/13/2018 for CT scan   FINDINGS: Evidence of active GI blood loss originating in the region of the splenic flexure region of colon and continuing down the descending colon.   IMPRESSION: Active GI blood loss originating in the region of the splenic flexure of the colon.  Statin:  No. Beta Blocker:  Yes.   Aspirin:  Yes.   ACEI:  No. ARB:  No. CCB use:  No Other antiplatelets/anticoagulants:  No.    ASSESSMENT/PLAN: This is a 88 y.o. female who presented to [facility] with GI bleeding.  On workup patient underwent a colonoscopy without finding active bleeding.  Patient then underwent nuclear med scan which shows active bleeding at the splenic flexure.  [place] surgery was consulted  PLAN: She will be taken to the vascular lab this afternoon for gastrointestinal embolization to stop the active bleeding at the splenic flexure of her colon.  I discussed in detail with the patient the procedure, benefits, complications, risks.  Verbalizes her understanding.  I answered all her questions this afternoon.  She would like to proceed with the procedure as soon as possible.  Patient has been n.p.o. since midnight.  Patient's vitals all remained stable even though she states that she feels symptomatic when she stands up.   -I discussed the plan with Dr. Festus Barren MD and he is in agreement with the plan   Marcie Bal [facility] 09/14/2022 4:49 PM

## 2022-09-14 NOTE — Care Plan (Signed)
Brief GI postop note  Patient underwent colonoscopy demonstrating blood and clot throughout the colon except for the sigmoid and rectosigmoid area. No clear source was actively visualized The appendiceal orifice appeared to be impacted with stool and clot which was attempted to be suctioned but unable to be removed. fresh blood did appear from behind this area that was not able to be assessed  There was also a stricture entering the cecum that the pediatric colonoscope was able to traverse.  This area was also friable and had some bleeding.  Have discussed the case with the hospitalist and vascular surgery.  Will perform tagged RBC scan at this time to assess for extravasation.  If this is present the patient may need embolization with vascular surgery consult.  If negative recommend continuing to monitor the patient.  Maintain n.p.o. status at this time Monitor H&H.  Transfusion and resuscitation as per primary team  Enis Slipper, DO [facility]

## 2022-09-14 NOTE — Progress Notes (Addendum)
Progress Note    Tracy Long  ZOX:[id number] DOB: 27-Nov-1934  DOA: 09/13/2022 PCP: Gracelyn Nurse, MD      Brief Narrative:    Medical records reviewed and are as summarized below:  Tracy Long is a 88 y.o. female  with medical history significant for polymyalgia rheumatica, Parkinson's disease, peripheral vascular disease with PCI to the left femoral artery, CAD with previous MI, remote history of atrial fibrillation s/p ablation in 2007, basal cell carcinoma of the forehead, hypothyroidism, chronic anxiety/depression, who presented to the emergency department with painless rectal bleeding.  She has no history of GI bleed.  She takes a daily aspirin and meloxicam at home.   She was admitted to the hospital for acute lower GI bleeding complicated by acute blood loss anemia.  She was treated with IV fluids.     Assessment/Plan:   Principal Problem:   Rectal bleeding Active Problems:   Acute blood loss anemia    Body mass index is 21.14 kg/m.   Acute lower GI bleeding: S/p colonoscopy on 09/14/2022 which showed diverticulosis in the colon, stricture in the cecum, nonbleeding internal hemorrhoids.  Patient had GI bleeding nuclear scan today which showed active GI blood loss originating in the region of the splenic flexure of the colon.  Vascular surgery team has been consulted to assist with management. Restart IV fluids.   Acute blood loss anemia: Hemoglobin is down to 9.1.  Hemoglobin was 12.7 on admission.  There is no indication for blood transfusion at this time.  Monitor H&H and transfuse as needed.   CAD, PVD: Aspirin has been held.   Parkinson's disease: Continue carbidopa/levodopa.   Osteoarthritis: She has been advised to avoid meloxicam   Other comorbidities include depression, hypertension, osteopenia, polymyalgia rheumatica, hypothyroidism     Diet Order             Diet NPO time specified  Diet effective now                             Consultants: Gastroenterologist  Procedures: Colonoscopy    Medications:    [MAR Hold] carbidopa-levodopa  1 tablet Oral QID   [MAR Hold] cycloSPORINE  1 drop Both Eyes BID   [MAR Hold] levothyroxine  50 mcg Oral Q0600   [MAR Hold] loratadine  10 mg Oral Daily   [MAR Hold] metoprolol succinate  12.5 mg Oral Daily   [MAR Hold] midodrine  2.5 mg Oral TID WC   [MAR Hold] PARoxetine  10 mg Oral Daily   Continuous Infusions:  sodium chloride 20 mL/hr at 09/14/22 1227     Anti-infectives (From admission, onward)    None              Family Communication/Anticipated D/C date and plan/Code Status   DVT prophylaxis: SCDs Start: 09/13/22 0230     Code Status: Full Code  Family Communication: None Disposition Plan: Plan to discharge home in 1 to 2 days   Status is: Inpatient Remains inpatient appropriate because: Workup for GI bleeding is in progress.       Subjective:   Interval events noted.  She had multiple watery stools with colon prep but no further rectal bleeding.  Dizziness has improved.  Objective:    Vitals:   09/14/22 1311 09/14/22 1323 09/14/22 1341 09/14/22 1342  BP: 99/84 (!) 140/59 (!) 137/94 110/75  Pulse: 73 73 71 69  Resp: 15 15  14 (!) 21  Temp: (!) 96.1 F (35.6 C)     TempSrc: Temporal     SpO2: 100% 100% 100% 100%  Weight:      Height:       No data found.   Intake/Output Summary (Last 24 hours) at 09/14/2022 1438 Last data filed at 09/14/2022 1300 Gross per 24 hour  Intake 3151.49 ml  Output --  Net 3151.49 ml   Filed Weights   09/12/22 1940  Weight: 61.2 kg    Exam:  GEN: NAD SKIN: No rash EYES: EOMI ENT: MMM CV: RRR PULM: CTA B ABD: soft, ND, NT, +BS CNS: AAO x 3, non focal EXT: No edema or tenderness        Data Reviewed:   I have personally reviewed following labs and imaging studies:  Labs: Labs show the following:   Basic Metabolic Panel: Recent Labs  Lab  09/12/22 1943 09/13/22 0600 09/14/22 0515  NA 137 139 138  K 4.6 3.5 3.8  CL 103 112* 107  CO2 30 22 28   GLUCOSE 97 68* 94  BUN 36* 27* 21  CREATININE 0.82 0.58 0.60  CALCIUM 9.0 7.4* 8.3*  MG  --  1.7  --   PHOS  --  2.4*  --    GFR Estimated Creatinine Clearance: 48.8 mL/min (by C-G formula based on SCr of 0.6 mg/dL). Liver Function Tests: Recent Labs  Lab 09/12/22 1943 09/13/22 0600  AST 19 18  ALT <5 10  ALKPHOS 59 37*  BILITOT 0.5 0.5  PROT 6.6 4.9*  ALBUMIN 3.5 2.4*   No results for input(s): "LIPASE", "AMYLASE" in the last 168 hours. No results for input(s): "AMMONIA" in the last 168 hours. Coagulation profile Recent Labs  Lab 09/13/22 0600 09/14/22 0515  INR 1.1 1.2    CBC: Recent Labs  Lab 09/12/22 1943 09/13/22 0026 09/13/22 0600 09/13/22 0833 09/13/22 1724 09/13/22 2332 09/14/22 0515  WBC 5.8  --  6.9  --   --   --  6.9  NEUTROABS 3.5  --   --   --   --   --   --   HGB 12.7   < > 10.8* 10.5* 9.4* 10.0* 9.1*  HCT 39.8   < > 33.8* 33.1* 29.8* 31.6* 28.2*  MCV 91.1  --  90.9  --   --   --  90.4  PLT 250  --  217  --   --   --  204   < > = values in this interval not displayed.   Cardiac Enzymes: No results for input(s): "CKTOTAL", "CKMB", "CKMBINDEX", "TROPONINI" in the last 168 hours. BNP (last 3 results) No results for input(s): "PROBNP" in the last 8760 hours. CBG: No results for input(s): "GLUCAP" in the last 168 hours. D-Dimer: No results for input(s): "DDIMER" in the last 72 hours. Hgb A1c: No results for input(s): "HGBA1C" in the last 72 hours. Lipid Profile: No results for input(s): "CHOL", "HDL", "LDLCALC", "TRIG", "CHOLHDL", "LDLDIRECT" in the last 72 hours. Thyroid function studies: No results for input(s): "TSH", "T4TOTAL", "T3FREE", "THYROIDAB" in the last 72 hours.  Invalid input(s): "FREET3" Anemia work up: No results for input(s): "VITAMINB12", "FOLATE", "FERRITIN", "TIBC", "IRON", "RETICCTPCT" in the last 72  hours. Sepsis Labs: Recent Labs  Lab 09/12/22 1943 09/13/22 0600 09/14/22 0515  WBC 5.8 6.9 6.9    Microbiology No results found for this or any previous visit (from the past 240 hour(s)).  Procedures and diagnostic studies:  CT ANGIO GI BLEED  Result Date: 09/13/2022 CLINICAL DATA:  Painless hematochezia EXAM: CTA ABDOMEN AND PELVIS WITHOUT AND WITH CONTRAST TECHNIQUE: Multidetector CT imaging of the abdomen and pelvis was performed using the standard protocol during bolus administration of intravenous contrast. Multiplanar reconstructed images and MIPs were obtained and reviewed to evaluate the vascular anatomy. RADIATION DOSE REDUCTION: This exam was performed according to the departmental dose-optimization program which includes automated exposure control, adjustment of the mA and/or kV according to patient size and/or use of iterative reconstruction technique. CONTRAST:  OMNIPAQUE IOHEXOL 350 MG/ML SOLN COMPARISON:  None Available. FINDINGS: VASCULAR Aorta: Non contrasted images demonstrate no acute intramural hematoma. Nonaneurysmal aorta. No dissection is seen. Moderate aortic atherosclerosis Celiac: Patent without evidence of aneurysm, dissection, vasculitis or significant stenosis. SMA: Patent without evidence of aneurysm, dissection, vasculitis or significant stenosis. Renals: Both renal arteries are patent without evidence of dissection, vasculitis, fibromuscular dysplasia or significant stenosis. Calcification at the origin of left renal artery but no significant stenosis. 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 IMA: Diminutive appearing inferior mesenteric artery, but appears patent on sagittal images. No occlusion or aneurysm. Inflow: Stent within the left external iliac artery which is patent. Mild aortic atherosclerosis. No aneurysm or dissection. Proximal Outflow: Bilateral common femoral and visualized portions of the superficial and profunda femoral arteries are  patent without evidence of aneurysm, dissection, vasculitis or significant stenosis. Review of the MIP images confirms the above findings. NON-VASCULAR Lower chest: Lung bases demonstrate no acute airspace disease. 6 mm left anterior lung base pulmonary nodule, series 16, image 2. Hepatobiliary: No focal liver abnormality is seen. No gallstones, gallbladder wall thickening, or biliary dilatation. Pancreas: Unremarkable. No pancreatic ductal dilatation or surrounding inflammatory changes. Spleen: Normal in size without focal abnormality. Adrenals/Urinary Tract: Adrenal glands are within normal limits. Kidneys show no hydronephrosis. The bladder is unremarkable Stomach/Bowel: The stomach is nonenlarged. No dilated small bowel. Diffuse diverticular disease of the colon without acute wall thickening. Large diverticulum at the cecum. Lymphatic: No suspicious lymph nodes Reproductive: Hysterectomy.  No adnexal mass Other: Negative for pelvic effusion or free air Musculoskeletal: Treated compression deformities at L1 and L2. Suspected chronic superior endplate deformity at L4. Trace retrolisthesis L2 on L3. IMPRESSION: VASCULAR 1. Negative for active intraluminal extravasation to suggest active GI bleeding at this time. 2. Aortic atherosclerosis without evidence for dissection. 3. 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 Extensive diverticular disease of the colon without acute inflammatory changes 2. No CT evidence for acute intra-abdominal or pelvic abnormality 3. 6 mm left lung base pulmonary nodule. Non-contrast chest CT at 6-12 months is recommended. If the nodule is stable at time of repeat CT, then future CT at 18-24 months (from today's scan) is considered optional for low-risk patients, but is recommended for high-risk patients. This recommendation follows the consensus statement: Guidelines for Management of Incidental Pulmonary Nodules Detected on CT Images: From the Fleischner Society  2017; Radiology 2017; 284:228-243. Electronically Signed   By: Jasmine Pang M.D.   On: 09/13/2022 02:02               LOS: 1 day   Meshach Perry  [facility]   Pager on [email or web address]. If 7PM-7AM, please contact night-coverage at [email or web address]     09/14/2022, 2:38 PM

## 2022-09-14 NOTE — Op Note (Addendum)
[facility] Gastroenterology Patient Name: Tracy Long Procedure Date: 09/14/2022 12:29 PM MRN: [id number] Account #: [id number] Date of Birth: [birth year] Admit Type: Inpatient Age: 88 Room: [facility] ENDO ROOM 1 Gender: Female Note Status: Supervisor Override Instrument Name: Peds Colonoscope [id number] Procedure:             Colonoscopy Indications:           Hematochezia Providers:             Jaynie Collins DO, DO Medicines:             Monitored Anesthesia Care Complications:         No immediate complications. Estimated blood loss:                         Minimal. Procedure:             Pre-Anesthesia Assessment:                        - Prior to the procedure, a History and Physical was                         performed, and patient medications and allergies were                         reviewed. The patient is competent. The risks and                         benefits of the procedure and the sedation options and                         risks were discussed with the patient. All questions                         were answered and informed consent was obtained.                         Patient identification and proposed procedure were                         verified by the physician, the nurse, the anesthetist                         and the technician in the endoscopy suite. Mental                         Status Examination: alert and oriented. Airway                         Examination: normal oropharyngeal airway and neck                         mobility. Respiratory Examination: clear to                         auscultation. CV Examination: RRR, no murmurs, no S3                         or S4. Prophylactic Antibiotics: The patient does not  require prophylactic antibiotics. Prior                         Anticoagulants: The patient has taken no anticoagulant                         or antiplatelet agents. ASA Grade Assessment: III  - A                         patient with severe systemic disease. After reviewing                         the risks and benefits, the patient was deemed in                         satisfactory condition to undergo the procedure. The                         anesthesia plan was to use monitored anesthesia care                         (MAC). Immediately prior to administration of                         medications, the patient was re-assessed for adequacy                         to receive sedatives. The heart rate, respiratory                         rate, oxygen saturations, blood pressure, adequacy of                         pulmonary ventilation, and response to care were                         monitored throughout the procedure. The physical                         status of the patient was re-assessed after the                         procedure.                        After obtaining informed consent, the colonoscope was                         passed under direct vision. Throughout the procedure,                         the patient's blood pressure, pulse, and oxygen                         saturations were monitored continuously. The                         Colonoscope was introduced through the anus and  advanced to the the cecum, identified by the                         appendiceal orifice, IC valve and transillumination.                         The colonoscopy was somewhat difficult due to                         excessive bleeding. Successful completion of the                         procedure was aided by lavage. The patient tolerated                         the procedure well. The quality of the bowel                         preparation was fair. The ileocecal valve, appendiceal                         orifice, and rectum were photographed. Findings:      Hemorrhoids were found on perianal exam.      The digital rectal exam was normal. Pertinent  negatives include normal       sphincter tone.      Multiple small-mouthed diverticula were found in the entire colon.      A benign-appearing, intrinsic moderate stenosis measuring less than one       cm (in length) x 1.2 cm (inner diameter) was found in the cecum and was       traversed. Estimated blood loss: none. Oozes on contact and with       traversing. Was able to traverse with pediatric scope without issue       Estimated blood loss was minimal.      Fresh and old blood noted throughout the colon limiting visualization.       No active source was able to be found. There appeared to be some oozing       from behind impacted stool/clot in the AO. I was unable to intubate the       TI.      Restricted mobility of the sigmoid colon also noted.      Non-bleeding internal hemorrhoids were found during retroflexion and       during perianal exam. The hemorrhoids were Grade III (internal       hemorrhoids that prolapse but require manual reduction). Estimated blood       loss: none.      no blood noted in the sigmoid/rectosigmoid or rectum Impression:            - Preparation of the colon was fair.                        - Hemorrhoids found on perianal exam.                        - Diverticulosis in the entire examined colon.                        - Stricture in the cecum.                        -  Non-bleeding internal hemorrhoids.                        - No specimens collected. Recommendation:        - Patient has a contact number available for                         emergencies. The signs and symptoms of potential                         delayed complications were discussed with the patient.                         Return to normal activities tomorrow. Written                         discharge instructions were provided to the patient.                        - Return patient to hospital ward for ongoing care.                        - NPO.                        - Continue  present medications.                        - The findings and recommendations were discussed with                         the referring physician.                        - The findings and recommendations were discussed with                         the surgeon.                        - The findings and recommendations were discussed with                         the patient.                        - Order tagged rbc scan. if positive, involve vascular                         surgery for possible embolization. if negative,                         continue to monitor. Procedure Code(s):     --- Professional ---                        [phone], Colonoscopy, flexible; diagnostic, including                         collection of specimen(s) by brushing or washing, when  performed (separate procedure) Diagnosis Code(s):     --- Professional ---                        K64.2, Third degree hemorrhoids                        K56.699, Other intestinal obstruction unspecified as                         to partial versus complete obstruction                        K92.1, Melena (includes Hematochezia)                        K57.30, Diverticulosis of large intestine without                         perforation or abscess without bleeding CPT copyright 2022 American Medical Association. All rights reserved. The codes documented in this report are preliminary and upon coder review may  be revised to meet current compliance requirements. Attending Participation:      I personally performed the entire procedure. Elfredia Nevins, DO Jaynie Collins DO, DO 09/14/2022 1:22:37 PM This report has been signed electronically. Number of Addenda: 0 Note Initiated On: 09/14/2022 12:29 PM Scope Withdrawal Time: 0 hours 15 minutes 54 seconds  Total Procedure Duration: 0 hours 32 minutes 31 seconds  Estimated Blood Loss:  Estimated blood loss was minimal.      [facility]

## 2022-09-14 NOTE — Anesthesia Preprocedure Evaluation (Signed)
Anesthesia Evaluation  Patient identified by MRN, date of birth, ID band Patient awake  General Assessment Comment:  Patient with bleeding from rectum. Hemodynamically stable, patient with no upper GI symptoms.  Reviewed: Allergy & Precautions, NPO status , Patient's Chart, lab work & pertinent test results  History of Anesthesia Complications Negative for: history of anesthetic complications  Airway Mallampati: II  TM Distance: >3 FB Neck ROM: Full    Dental no notable dental hx. (+) Teeth Intact   Pulmonary neg pulmonary ROS, neg sleep apnea, neg COPD, Patient abstained from smoking.Not current smoker   Pulmonary exam normal breath sounds clear to auscultation       Cardiovascular Exercise Tolerance: Good METShypertension, Pt. on medications (-) CAD and (-) Past MI (-) dysrhythmias  Rhythm:Regular Rate:Normal - Systolic murmurs    Neuro/Psych negative neurological ROS  negative psych ROS   GI/Hepatic ,neg GERD  ,,(+)     (-) substance abuse    Endo/Other  neg diabetes    Renal/GU Renal disease     Musculoskeletal   Abdominal   Peds  Hematology   Anesthesia Other Findings Past Medical History: No date: Arthritis No date: Cancer (HCC)     Comment:  breast Rt No date: Hypertension No date: Inverted nipple No date: Parkinson disease No date: Renal disorder No date: Thyroid disease  Reproductive/Obstetrics                             Anesthesia Physical Anesthesia Plan  ASA: 2  Anesthesia Plan: General   Post-op Pain Management: Minimal or no pain anticipated   Induction: Intravenous  PONV Risk Score and Plan: 3 and Propofol infusion, TIVA and Ondansetron  Airway Management Planned: Nasal Cannula  Additional Equipment: None  Intra-op Plan:   Post-operative Plan:   Informed Consent: I have reviewed the patients History and Physical, chart, labs and discussed the  procedure including the risks, benefits and alternatives for the proposed anesthesia with the patient or authorized representative who has indicated his/her understanding and acceptance.     Dental advisory given  Plan Discussed with: CRNA and Surgeon  Anesthesia Plan Comments: (Discussed risks of anesthesia with patient, including possibility of difficulty with spontaneous ventilation under anesthesia necessitating airway intervention, PONV, and rare risks such as cardiac or respiratory or neurological events, and allergic reactions. Discussed the role of CRNA in patient's perioperative care. Patient understands.)       Anesthesia Quick Evaluation

## 2022-09-15 ENCOUNTER — Encounter: Payer: Self-pay | Admitting: Vascular Surgery

## 2022-09-15 DIAGNOSIS — K625 Hemorrhage of anus and rectum: Secondary | ICD-10-CM | POA: Diagnosis not present

## 2022-09-15 DIAGNOSIS — K55059 Acute (reversible) ischemia of intestine, part and extent unspecified: Secondary | ICD-10-CM

## 2022-09-15 DIAGNOSIS — K922 Gastrointestinal hemorrhage, unspecified: Principal | ICD-10-CM

## 2022-09-15 DIAGNOSIS — Z9889 Other specified postprocedural states: Secondary | ICD-10-CM | POA: Diagnosis not present

## 2022-09-15 LAB — IRON AND TIBC
Iron: 9 ug/dL — ABNORMAL LOW (ref 28–170)
Saturation Ratios: 4 % — ABNORMAL LOW (ref 10.4–31.8)
TIBC: 232 ug/dL — ABNORMAL LOW (ref 250–450)
UIBC: 223 ug/dL

## 2022-09-15 LAB — CBC
HCT: 25.5 % — ABNORMAL LOW (ref 36.0–46.0)
Hemoglobin: 8.3 g/dL — ABNORMAL LOW (ref 12.0–15.0)
MCH: 29.5 pg (ref 26.0–34.0)
MCHC: 32.5 g/dL (ref 30.0–36.0)
MCV: 90.7 fL (ref 80.0–100.0)
Platelets: 197 10*3/uL (ref 150–400)
RBC: 2.81 MIL/uL — ABNORMAL LOW (ref 3.87–5.11)
RDW: 13.6 % (ref 11.5–15.5)
WBC: 20.2 10*3/uL — ABNORMAL HIGH (ref 4.0–10.5)
nRBC: 0 % (ref 0.0–0.2)

## 2022-09-15 LAB — CBC WITH DIFFERENTIAL/PLATELET
Abs Immature Granulocytes: 0.04 10*3/uL (ref 0.00–0.07)
Basophils Absolute: 0 10*3/uL (ref 0.0–0.1)
Basophils Relative: 0 %
Eosinophils Absolute: 0 10*3/uL (ref 0.0–0.5)
Eosinophils Relative: 0 %
HCT: 26.7 % — ABNORMAL LOW (ref 36.0–46.0)
Hemoglobin: 8.6 g/dL — ABNORMAL LOW (ref 12.0–15.0)
Immature Granulocytes: 0 %
Lymphocytes Relative: 6 %
Lymphs Abs: 0.8 10*3/uL (ref 0.7–4.0)
MCH: 29.1 pg (ref 26.0–34.0)
MCHC: 32.2 g/dL (ref 30.0–36.0)
MCV: 90.2 fL (ref 80.0–100.0)
Monocytes Absolute: 1.2 10*3/uL — ABNORMAL HIGH (ref 0.1–1.0)
Monocytes Relative: 9 %
Neutro Abs: 11.2 10*3/uL — ABNORMAL HIGH (ref 1.7–7.7)
Neutrophils Relative %: 85 %
Platelets: 195 10*3/uL (ref 150–400)
RBC: 2.96 MIL/uL — ABNORMAL LOW (ref 3.87–5.11)
RDW: 13.4 % (ref 11.5–15.5)
WBC: 13.3 10*3/uL — ABNORMAL HIGH (ref 4.0–10.5)
nRBC: 0 % (ref 0.0–0.2)

## 2022-09-15 LAB — TROPONIN I (HIGH SENSITIVITY)
Troponin I (High Sensitivity): 22 ng/L — ABNORMAL HIGH (ref <18)
Troponin I (High Sensitivity): 23 ng/L — ABNORMAL HIGH (ref <18)

## 2022-09-15 LAB — FERRITIN: Ferritin: 50 ng/mL (ref 11–307)

## 2022-09-15 MED ORDER — LIDOCAINE VISCOUS HCL 2 % MT SOLN
15.0000 mL | Freq: Once | OROMUCOSAL | Status: AC
  Administered 2022-09-15: 15 mL via OROMUCOSAL
  Filled 2022-09-15: qty 15

## 2022-09-15 MED ORDER — ALUM & MAG HYDROXIDE-SIMETH 200-200-20 MG/5ML PO SUSP: 30.0000 mL | Freq: Four times a day (QID) | ORAL | Status: DC | PRN

## 2022-09-15 MED ORDER — NITROGLYCERIN 0.4 MG SL SUBL: 0.4000 mg | SUBLINGUAL_TABLET | SUBLINGUAL | Status: DC | PRN

## 2022-09-15 MED ORDER — ALUM & MAG HYDROXIDE-SIMETH 200-200-20 MG/5ML PO SUSP
30.0000 mL | Freq: Once | ORAL | Status: AC
  Administered 2022-09-15: 30 mL via ORAL
  Filled 2022-09-15: qty 30

## 2022-09-15 MED ORDER — ACETAMINOPHEN 325 MG PO TABS
650.0000 mg | ORAL_TABLET | Freq: Four times a day (QID) | ORAL | Status: DC | PRN
  Administered 2022-09-15 – 2022-09-16 (×3): 650 mg via ORAL
  Filled 2022-09-15 (×3): qty 2

## 2022-09-15 MED ORDER — SUCRALFATE 1 GM/10ML PO SUSP
1.0000 g | Freq: Once | ORAL | Status: AC
  Administered 2022-09-15: 1 g via ORAL
  Filled 2022-09-15: qty 10

## 2022-09-15 NOTE — Progress Notes (Signed)
Progress Note    09/15/2022 12:34 PM 1 Day Post-Op  Subjective: Tracy Long is an 88 year old female who is now status postop day 1 from aortogram with selective angiogram of the IMA/SMA  and left colonic branch with microbead embolization and coil embolization.  On exam this morning patient is resting comfortably in bed.  She denies any abdominal pain nausea or vomiting.  Denies any chest pain shortness of breath.  She does still endorse her room being dizzy when she stands up slightly.  She also endorses soreness of the right groin insertion site.  Overnight no other complaints.  Vitals all remained stable.   Vitals:   09/15/22 0727 09/15/22 1220  BP: (!) 169/67 (!) 119/58  Pulse: 77 78  Resp: 18 20  Temp: 98.6 F (37 C) 98.4 F (36.9 C)  SpO2: 96% 98%   Physical Exam: Cardiac:  RRR,  without  Murmurs, rubs or gallops; without carotid bruits  Lungs:  normal non-labored breathing, without Rales, rhonchi, wheezing  Incisions:  Right groin with dressing clean dry and intact.  Extremities:  Palpable pulses throughout Abdomen:  Positive bowel sounds, soft, NT/ND, no masses  Neurologic: A&O X 3; No focal weakness or paresthesias are detected; speech is fluent/normal   CBC    Component Value Date/Time   WBC 13.3 (H) 09/15/2022 0323   RBC 2.96 (L) 09/15/2022 0323   HGB 8.6 (L) 09/15/2022 0323   HGB 8.5 (L) 05/25/2012 0907   HCT 26.7 (L) 09/15/2022 0323   HCT 21.3 (L) 05/24/2012 0921   PLT 195 09/15/2022 0323   PLT 220 05/24/2012 0921   MCV 90.2 09/15/2022 0323   MCV 88 05/24/2012 0921   MCH 29.1 09/15/2022 0323   MCHC 32.2 09/15/2022 0323   RDW 13.4 09/15/2022 0323   RDW 15.2 (H) 05/24/2012 0921   LYMPHSABS 0.8 09/15/2022 0323   LYMPHSABS 2.1 05/24/2012 0921   MONOABS 1.2 (H) 09/15/2022 0323   MONOABS 1.2 (H) 05/24/2012 0921   EOSABS 0.0 09/15/2022 0323   EOSABS 0.2 05/24/2012 0921   BASOSABS 0.0 09/15/2022 0323   BASOSABS 0.1 05/24/2012 0921    BMET     Component Value Date/Time   NA 138 09/14/2022 0515   NA 141 05/23/2012 0416   K 3.8 09/14/2022 0515   K 3.8 05/23/2012 0416   CL 107 09/14/2022 0515   CL 106 05/23/2012 0416   CO2 28 09/14/2022 0515   CO2 30 05/23/2012 0416   GLUCOSE 94 09/14/2022 0515   GLUCOSE 91 05/23/2012 0416   BUN 21 09/14/2022 0515   BUN 18 05/23/2012 0416   CREATININE 0.60 09/14/2022 0515   CREATININE 0.53 (L) 05/23/2012 0416   CALCIUM 8.3 (L) 09/14/2022 0515   CALCIUM 8.1 (L) 05/23/2012 0416   GFRNONAA >60 09/14/2022 0515   GFRNONAA >60 05/23/2012 0416   GFRAA >60 05/23/2012 0416    INR    Component Value Date/Time   INR 1.2 09/14/2022 0515   INR 1.0 05/23/2012 0416     Intake/Output Summary (Last 24 hours) at 09/15/2022 1234 Last data filed at 09/15/2022 1108 Gross per 24 hour  Intake 1400 ml  Output --  Net 1400 ml     Assessment/Plan:  88 y.o. female is s/p aortogram with selective angiogram of the IMA/SMA  and left colonic branch with microbead embolization and coil embolization 1 Day Post-Op   PLAN: Per vascular surgery patient's GI bleeding is now controlled.  Microbead and coil embolization to the splenic flexure  has been completed.  There are no other plans for another intervention at this time.  If the patient continues to bleed from this area please consult GI surgery. Continue to monitor H&H for bleeding. No anticoagulation needed at this time.   DVT prophylaxis:  NON due to GI Bleeding   Marcie [facility] 09/15/2022 12:34 PM

## 2022-09-15 NOTE — Hospital Course (Signed)
PMH of PMR, Parkinson's, PVD, CAD, chronic A-fib SP ablation, basal cell carcinoma, hypothyroidism, depression present to the hospital with complaints of BRBPR.  GI was consulted.  Underwent colonoscopy without any clear source of active bleeding.  Tagged RBC scan shows evidence of active bleeding at the splenic flexure area.  Vascular surgery was consulted.  Underwent Macrobid embolization of left colonic branch of the IMA and coil embolization on 4/30.  Monitor for recurrent bleed now.

## 2022-09-15 NOTE — Progress Notes (Signed)
       CROSS COVER NOTE  NAME: Brandey Vandalen MRN: [id number] DOB : August 05, 1934 ATTENDING PHYSICIAN: Lurene Shadow, MD    Date of Service   09/15/2022   HPI/Events of Note   Overnight M(r)s N[place]a reports 8/10 (L) chest and abdominal pain described as pressure that first began in PACU yesterday while resting in bed and subsided without intervention. The same pain recurred tonight after getting up to the bedside commode. The pain is not reproducible on palpation.  Denies dyspnea, fatigue, palpitations, dizziness, vomiting, radiation of pain. Endorses abdominal pain and nausea.  Pain relieved by GI Cocktail and abdominal tenderness from procedure remains.   Interventions   Assessment/Plan:   EKG SL Nitro Troponin- 22--> GI Cocktail          To reach the provider On-Call:   7AM- 7PM see care teams to locate the attending and reach out to them via [email or web address] Password: [email or web address] 7PM-7AM contact night-coverage If you still have difficulty reaching the appropriate provider, please page the [facility] (Director on Call) for [facility] on amion for assistance  This document was prepared using Conservation officer, historic buildings and may include unintentional dictation errors.  Bishop Limbo DNP, MBA, FNP-BC, PMHNP-BC Nurse Practitioner [facility] Pager [phone]

## 2022-09-15 NOTE — Progress Notes (Signed)
GI Inpatient Follow-up Note  Patient Identification: Martena Emanuele is a 88 y.o. female admitted for rectal bleeding.   Subjective: Some abd pain since colonoscopy, also had some chest pain overnight that has completely resolved w/ GI cocktail. Still feeling some lower abd discomfort like she needs to pass gas. One small BM this AM without any blood noted. No bleeding since procedure. Tolerated clear liquid breakfast tray.  Scheduled Inpatient Medications:   carbidopa-levodopa  1 tablet Oral QID   cycloSPORINE  1 drop Both Eyes BID   levothyroxine  50 mcg Oral Q0600   loratadine  10 mg Oral Daily   metoprolol succinate  12.5 mg Oral Daily   PARoxetine  10 mg Oral Daily    Continuous Inpatient Infusions:    sodium chloride 75 mL/hr at 09/15/22 0307    PRN Inpatient Medications:  acetaminophen, fluticasone, nitroGLYCERIN, polyethylene glycol-electrolytes  Review of Systems: Constitutional: Weight is stable.  Eyes: No changes in vision. ENT: No oral lesions, sore throat.  GI: see HPI.  Heme/Lymph: No easy bruising.  CV: No chest pain.  GU: No hematuria.  Integumentary: No rashes.  Neuro: No headaches.  Psych: No depression/anxiety.  Endocrine: No heat/cold intolerance.  Allergic/Immunologic: No urticaria.  Resp: No cough, SOB.  Musculoskeletal: No joint swelling.    Physical Examination: BP (!) 169/67 (BP Location: Left Arm)   Pulse 77   Temp 98.6 F (37 C) (Oral)   Resp 18   Ht 5\' 7"  (1.702 m)   Wt 61.2 kg   SpO2 96%   BMI 21.14 kg/m  Gen: NAD, alert and oriented x 4, resting in bed HEENT: PEERLA, EOMI, Neck: supple, no JVD or thyromegaly Chest: CTA bilaterally, no wheezes, crackles, or other adventitious sounds CV: RRR, no m/g/c/r Abd: soft, mild TTP bilateral lower abd, +BS in all four quadrants; no HSM, guarding, ridigity, or rebound tenderness Ext: no edema, well perfused with 2+ pulses, Skin: no rash or lesions noted Lymph: no LAD  Data: Lab  Results  Component Value Date   WBC 13.3 (H) 09/15/2022   HGB 8.6 (L) 09/15/2022   HCT 26.7 (L) 09/15/2022   MCV 90.2 09/15/2022   PLT 195 09/15/2022   Recent Labs  Lab 09/14/22 0515 09/14/22 1958 09/15/22 0323  HGB 9.1* 8.7* 8.6*   Lab Results  Component Value Date   NA 138 09/14/2022   K 3.8 09/14/2022   CL 107 09/14/2022   CO2 28 09/14/2022   BUN 21 09/14/2022   CREATININE 0.60 09/14/2022   Lab Results  Component Value Date   ALT 10 09/13/2022   AST 18 09/13/2022   ALKPHOS 37 (L) 09/13/2022   BILITOT 0.5 09/13/2022   Recent Labs  Lab 09/13/22 0600 09/14/22 0515  APTT 30  --   INR 1.1 1.2    Colonoscopy  09/14/22- Benign appearing intrinsic moderate stenosis found in the cecum and transversed.  Fresh and old blood noted throughout the colon lumen of equalization.  No active source found.  Some oozing from the hide impacted stool clot in the AAO, unable to intubate the TI.   GI Blood loss scan - IMPRESSION: Active GI blood loss originating in the region of the splenic flexure of the colon.    Op note - 1.  Ultrasound guidance for vascular access right femoral artery  2.  Catheter placement into the left colonic branch of the IMA as well as a splenic flexure branch of the transverse colonic branch of the SMA  3.  Aortogram and selective angiogram of the IMA and the left colonic branch of the IMA as well as selective angiogram of the SMA and the splenic flexure branch off of the transverse colonic branch of the SMA 4.  Microbead embolization of left colonic branch of the IMA with 1 cc of 500-700  polyvinyl alcohol beads as well as microbead embolization of the splenic flexure branch off of the SMA with a total of 2.5 cc of 500 to 700 m polyvinyl alcohol beads  5.  Coil embolization with two 2 mm Ruby coils to the splenic flexure branch off of the left colonic branch of the IMA  Assessment/Plan:  Ms. Lisbon is a 88 y.o. female admitted for rectal bleeding.    Rectal bleeding - colonoscopy with fresh and old blood throughout colon, had positive GI blood loss scan followed by embolization yesterday. No return of bleeding overnight. Small BM without blood this AM. Hgb down 2g from admission but likely stabilizing. Monitor for return of bleeding. Abd pain - post procedure, likely due to insufflation, improves w/ passing gas. Improved after GI cocktail around 3pm, will order repeat dose of simethicone/mylanta PRN if needed today  Recommendations:  Monitor for re bleed Monitor H&H. Transfusion and resuscitation as per primary team  Supportive care and antiemetics as per primary team Maintain two sites IV access Avoid nsaids Monitor for GIB   Case discussed w/ Dr. Timothy Lasso. Please call w/ any questions or concerns.  Tawni Pummel, PA-C [facility] GI

## 2022-09-15 NOTE — Progress Notes (Signed)
[facility] Progress Note Patient: Tracy Long ZOX:[id number] DOB: 1934-08-23 DOA: 09/13/2022  DOS: the patient was seen and examined on 09/15/2022  Brief hospital course: PMH of PMR, Parkinson's, PVD, CAD, chronic A-fib SP ablation, basal cell carcinoma, hypothyroidism, depression present to the hospital with complaints of BRBPR.  GI was consulted.  Underwent colonoscopy without any clear source of active bleeding.  Tagged RBC scan shows evidence of active bleeding at the splenic flexure area.  Vascular surgery was consulted.  Underwent Macrobid embolization of left colonic branch of the IMA and coil embolization on 4/30.  Monitor for recurrent bleed now. Assessment and Plan: Acute lower GI bleed. SP colonoscopy on 4/30 which shows evidence of diverticulosis without any active bleeding. Tagged RBC scan showed evidence of active GI bleed in the splenic flexure area. Vascular surgery was consulted and underwent IMA coiling and embolization. Currently still have old brownish blood in her stool.  Mild abdominal pain reported after eating. Monitor H&H which appears to be relatively stable with a slow drift down.  Acute blood loss anemia. Baseline hemoglobin around 12. Currently around 8. Monitor H&H. Transfuse for hemoglobin less than 7.  History of CAD and PVD. Chest pain. EKG unremarkable for any acute ischemia. Troponins are stable. Aspirin on hold due to presentation with GI bleed.  Leukocytosis. Likely related to her recent procedure. Monitor for stability.  History of Parkinson disease. On carbidopa and levodopa. Monitor.  HTN. Blood pressure stable. Will monitor.  Hypothyroidism. Continue Synthroid.  Subjective: No abdominal pain at the time of my evaluation but did have some abdominal pain earlier after eating.  Also had some chest pain last night after eating intervention.  Currently all resolved.  No fever no chills.  No nausea.  Physical Exam: General:  in Mild distress, No Rash Cardiovascular: S1 and S2 Present, aortic systolic  Murmur Respiratory: Good respiratory effort, Bilateral Air entry present. No Crackles, No wheezes Abdomen: Bowel Sound present, No tenderness Extremities: No edema Neuro: Alert and oriented x3, no new focal deficit  Data Reviewed: I have Reviewed nursing notes, Vitals, and Lab results. Since last encounter, pertinent lab results CBC and BMP   . I have ordered test including CBC and BMP  .  Disposition: Status is: Inpatient Remains inpatient appropriate because: Need for stability of H&H monitoring due to ongoing GI bleed  SCDs Start: 09/13/22 0230   Family Communication: Daughter at bedside Level of care: Progressive   Vitals:   09/15/22 0552 09/15/22 0727 09/15/22 1220 09/15/22 1733  BP: (!) 171/73 (!) 169/67 (!) 119/58 112/63  Pulse:  77 78 89  Resp:  18 20 20   Temp: 98.8 F (37.1 C) 98.6 F (37 C) 98.4 F (36.9 C) 98.2 F (36.8 C)  TempSrc: Oral Oral Oral   SpO2: 100% 96% 98% 100%  Weight:      Height:         Author: Lynden Oxford, MD 09/15/2022 7:01 PM  Please look on [email or web address] to find out who is on call.

## 2022-09-15 NOTE — Progress Notes (Signed)
  Transition of Care [facility]) Screening Note   Patient Details  Name: Tracy Long Date of Birth: 18-Jun-1934   Transition of Care [facility]) CM/SW Contact:    Truddie Hidden, RN Phone Number: 09/15/2022, 2:18 PM    Transition of Care Department [facility]) has reviewed patient and no TOC needs have been identified at this time. We will continue to monitor patient advancement through interdisciplinary progression rounds. If new patient transition needs arise, please place a TOC consult.

## 2022-09-16 DIAGNOSIS — K625 Hemorrhage of anus and rectum: Secondary | ICD-10-CM | POA: Diagnosis not present

## 2022-09-16 LAB — BASIC METABOLIC PANEL
Anion gap: 5 (ref 5–15)
Anion gap: 7 (ref 5–15)
BUN: 9 mg/dL (ref 8–23)
BUN: 14 mg/dL (ref 8–23)
CO2: 29 mmol/L (ref 22–32)
CO2: 26 mmol/L (ref 22–32)
Calcium: 8.6 mg/dL — ABNORMAL LOW (ref 8.9–10.3)
Calcium: 8.5 mg/dL — ABNORMAL LOW (ref 8.9–10.3)
Chloride: 103 mmol/L (ref 98–111)
Chloride: 101 mmol/L (ref 98–111)
Creatinine, Ser: 0.66 mg/dL (ref 0.44–1.00)
Creatinine, Ser: 0.66 mg/dL (ref 0.44–1.00)
GFR, Estimated: >60 mL/min (ref >60)
GFR, Estimated: >60 mL/min (ref >60)
Glucose, Bld: 92 mg/dL (ref 70–99)
Glucose, Bld: 96 mg/dL (ref 70–99)
Potassium: 2.7 mmol/L — CL (ref 3.5–5.1)
Potassium: 3.9 mmol/L (ref 3.5–5.1)
Sodium: 137 mmol/L (ref 135–145)
Sodium: 134 mmol/L — ABNORMAL LOW (ref 135–145)

## 2022-09-16 LAB — CBC
HCT: 24.6 % — ABNORMAL LOW (ref 36.0–46.0)
HCT: 25.6 % — ABNORMAL LOW (ref 36.0–46.0)
Hemoglobin: 7.8 g/dL — ABNORMAL LOW (ref 12.0–15.0)
Hemoglobin: 8.2 g/dL — ABNORMAL LOW (ref 12.0–15.0)
MCH: 28.9 pg (ref 26.0–34.0)
MCH: 29.2 pg (ref 26.0–34.0)
MCHC: 31.7 g/dL (ref 30.0–36.0)
MCHC: 32 g/dL (ref 30.0–36.0)
MCV: 91.1 fL (ref 80.0–100.0)
MCV: 91.1 fL (ref 80.0–100.0)
Platelets: 191 10*3/uL (ref 150–400)
Platelets: 209 10*3/uL (ref 150–400)
RBC: 2.7 MIL/uL — ABNORMAL LOW (ref 3.87–5.11)
RBC: 2.81 MIL/uL — ABNORMAL LOW (ref 3.87–5.11)
RDW: 13.4 % (ref 11.5–15.5)
RDW: 13.7 % (ref 11.5–15.5)
WBC: 18.9 10*3/uL — ABNORMAL HIGH (ref 4.0–10.5)
WBC: 19 10*3/uL — ABNORMAL HIGH (ref 4.0–10.5)
nRBC: 0 % (ref 0.0–0.2)
nRBC: 0 % (ref 0.0–0.2)

## 2022-09-16 LAB — VITAMIN B12: Vitamin B-12: 887 pg/mL (ref 180–914)

## 2022-09-16 LAB — MAGNESIUM: Magnesium: 1.8 mg/dL (ref 1.7–2.4)

## 2022-09-16 MED ORDER — POTASSIUM CHLORIDE CRYS ER 20 MEQ PO TBCR: 40.0000 meq | EXTENDED_RELEASE_TABLET | Freq: Four times a day (QID) | ORAL | Status: DC

## 2022-09-16 MED ORDER — POTASSIUM CHLORIDE CRYS ER 20 MEQ PO TBCR
40.0000 meq | EXTENDED_RELEASE_TABLET | ORAL | Status: AC
  Administered 2022-09-16: 40 meq via ORAL
  Filled 2022-09-16: qty 2

## 2022-09-16 MED ORDER — SODIUM CHLORIDE 0.9 % IV SOLN
250.0000 mg | Freq: Every day | INTRAVENOUS | Status: AC
  Administered 2022-09-16 – 2022-09-17 (×2): 250 mg via INTRAVENOUS
  Filled 2022-09-16 (×2): qty 20

## 2022-09-16 MED ORDER — ORAL CARE MOUTH RINSE: 15.0000 mL | OROMUCOSAL | Status: DC | PRN

## 2022-09-16 NOTE — Evaluation (Signed)
Physical Therapy Evaluation Patient Details Name: Tracy Long MRN: [id number] DOB: 1934-12-29 Today's Date: 09/16/2022  History of Present Illness  This is a 88 y.o. female with medical history significant for polymyalgia rheumatica, Parkinson's disease, peripheral vascular disease with PCI to the left femoral artery, CAD with previous MI, remote history of atrial fibrillation s/p ablation in 2007, basal cell carcinoma of the forehead, hypothyroidism, chronic anxiety/depression, who presented to the emergency department with painless rectal bleeding.   Clinical Impression  Pt admitted with above diagnosis. Pt currently with functional limitations due to the deficits listed below (see PT Problem List). Pt standing at sink upon entry and agreeable to PT. Pt reports at baseline she is indep with ADL's/IADL's and mod-I with SPC use.   To date pt ambulates with single HHA around unit with consistent step through pattern with brief periods of contralateral UE support on rails but this diminishes completely half way trough bout. Pt reports baseline unsteadiness and this is no different to her baseline this date. Pt safely asc/desc steps to demo ability to enter/exit home with mild eccentric weakness but able to control independently with rail use. Pt returns to recliner educated on consistent SPC use and energy conservation techniques due to acute weakness likely from low H&H. Pt understanding. Post session pt provided PT SPC to use during acute admission. Pt with excellent family support and with needed DME. Pt close to baseline with no acute needs. Reviewed with pt benefits of working with mobility specialists during stay to maintain activity levels. Pt in agreement with mobility plan. No acute needs identified with PT to sign off.          Recommendations for follow up therapy are one component of a multi-disciplinary discharge planning process, led by the attending physician.  Recommendations may  be updated based on patient status, additional functional criteria and insurance authorization.     Assistance Recommended at Discharge Intermittent Supervision/Assistance  Patient can return home with the following  Assistance with cooking/housework;A little help with bathing/dressing/bathroom;Assist for transportation    Equipment Recommendations None recommended by PT  Recommendations for Other Services       Functional Status Assessment Patient has not had a recent decline in their functional status     Precautions / Restrictions Precautions Precautions: Fall Restrictions Weight Bearing Restrictions: No      Mobility  Bed Mobility               General bed mobility comments: NT. In recliner post session. Standing at sink beginning of session Patient Response: Cooperative  Transfers Overall transfer level: Needs assistance   Transfers: Sit to/from Stand Sit to Stand: Supervision                Ambulation/Gait Ambulation/Gait assistance: Supervision Gait Distance (Feet): 300 Feet Assistive device: 1 person hand held assist Gait Pattern/deviations: Step-through pattern, Drifts right/left       General Gait Details: Pt with mild R/L drifting, uses single HHA+1 to mimic SPC use. Pt reports being mildly unsteady at baseline.  Stairs Stairs: Yes Stairs assistance: Min guard Stair Management: One rail Right, Alternating pattern, Forwards Number of Stairs: 6 General stair comments: asc/desc safely with single rail use. Mild eccentric difficulty with descent but pt able to maintain balance and LE control.  Wheelchair Mobility    Modified Rankin (Stroke Patients Only)       Balance Overall balance assessment: Mild deficits observed, not formally tested  Pertinent Vitals/Pain Pain Assessment Pain Assessment: Faces Faces Pain Scale: Hurts a little bit Pain Location: abdomen Pain  Descriptors / Indicators: Discomfort, Sore Pain Intervention(s): Limited activity within patient's tolerance, Monitored during session, Repositioned    Home Living Family/patient expects to be discharged to:: Private residence Living Arrangements: Alone Available Help at Discharge: Family;Friend(s) Type of Home: House (duplex/townhome set up) Home Access: Stairs to enter Entrance Stairs-Rails: Right;Left Entrance Stairs-Number of Steps: 2   Home Layout: One level Home Equipment: Agricultural consultant (2 wheels);Cane - single point;Grab bars - toilet      Prior Function Prior Level of Function : Independent/Modified Independent             Mobility Comments: mod-I SPC ADLs Comments: still driving     Hand Dominance        Extremity/Trunk Assessment   Upper Extremity Assessment Upper Extremity Assessment: Overall WFL for tasks assessed    Lower Extremity Assessment Lower Extremity Assessment: Overall WFL for tasks assessed       Communication   Communication: No difficulties  Cognition Arousal/Alertness: Awake/alert Behavior During Therapy: WFL for tasks assessed/performed Overall Cognitive Status: Within Functional Limits for tasks assessed                                          General Comments      Exercises Other Exercises Other Exercises: Energy conservation techniques and use of SPC to reduce falls risk due to unsteadiness and acute weakness.   Assessment/Plan    PT Assessment Patient does not need any further PT services  PT Problem List         PT Treatment Interventions      PT Goals (Current goals can be found in the Care Plan section)  Acute Rehab PT Goals PT Goal Formulation: All assessment and education complete, DC therapy    Frequency       Co-evaluation               AM-PAC PT "6 Clicks" Mobility  Outcome Measure Help needed turning from your back to your side while in a flat bed without using bedrails?:  None Help needed moving from lying on your back to sitting on the side of a flat bed without using bedrails?: None Help needed moving to and from a bed to a chair (including a wheelchair)?: None Help needed standing up from a chair using your arms (e.g., wheelchair or bedside chair)?: A Little Help needed to walk in hospital room?: A Little Help needed climbing 3-5 steps with a railing? : A Little 6 Click Score: 21    End of Session   Activity Tolerance: Patient tolerated treatment well Patient left: in chair;with call bell/phone within reach (MD at bedside) Nurse Communication: Mobility status PT Visit Diagnosis: Unsteadiness on feet (R26.81)    Time: 0981-1914 PT Time Calculation (min) (ACUTE ONLY): 14 min   Charges:   PT Evaluation $PT Eval Low Complexity: 1 Low          Chennel Olivos M. Fairly IV, PT, DPT Physical Therapist- [place]  [facility]  09/16/2022, 10:50 AM

## 2022-09-16 NOTE — Care Management Important Message (Signed)
Important Message  Patient Details  Name: Tracy Long MRN: [id number] Date of Birth: [birth year]   Medicare Important Message Given:  Yes     Johnell Comings 09/16/2022, 1:44 PM

## 2022-09-16 NOTE — Progress Notes (Signed)
[facility] Progress Note Patient: Tracy Long ZOX:[id number] DOB: [birth year] DOA: 09/13/2022  DOS: the patient was seen and examined on 09/16/2022  Brief hospital course: PMH of PMR, Parkinson's, PVD, CAD, chronic A-fib SP ablation, basal cell carcinoma, hypothyroidism, depression present to the hospital with complaints of BRBPR.  GI was consulted.  Underwent colonoscopy without any clear source of active bleeding.  Tagged RBC scan shows evidence of active bleeding at the splenic flexure area.  Vascular surgery was consulted.  Underwent Macrobid embolization of left colonic branch of the IMA and coil embolization on 4/30.  Monitor for recurrent bleed now. Assessment and Plan: Acute lower GI bleed. SP colonoscopy on 4/30 which shows evidence of diverticulosis without any active bleeding. Tagged RBC scan showed evidence of active GI bleed in the splenic flexure area. Vascular surgery was consulted and underwent IMA coiling and embolization. Currently still have old brownish blood in her stool.   Ongoing improving abdominal pain reported after eating. Monitor H&H which appears to be relatively stable with a slow drift down.  Acute blood loss anemia. Baseline hemoglobin around 12. Currently around 8. Monitor H&H. Transfuse for hemoglobin less than 7.  History of CAD and PVD. Chest pain. EKG unremarkable for any acute ischemia. Troponins are stable. Aspirin on hold due to presentation with GI bleed.  Leukocytosis. Likely related to her recent procedure. Monitor for stability.  History of Parkinson disease. On carbidopa and levodopa. Monitor.  HTN. Blood pressure stable. Will monitor.  Hypothyroidism. Continue Synthroid.  Hypokalemia. Replaced. Monitor.  Iron deficiency. Given her significantly low iron level and low hemoglobin would recommend that the patient receive IV iron.  Subjective: No nausea no vomiting.  No acute complaint but continues to have  abdominal pain which is improving.  No BM so far today.  Physical Exam: In mild distress.  Ambulating well. Clear to auscultation. Bowel sound present. Diffuse tenderness present.  More on the left. No edema.  Data Reviewed: I have Reviewed nursing notes, Vitals, and Lab results. Since last encounter, pertinent lab results CBC and BMP   . I have ordered test including CBC and BMP  .   Disposition: Status is: Inpatient Remains inpatient appropriate because: Need for stability of H&H monitoring due to ongoing GI bleed  SCDs Start: 09/13/22 0230   Family Communication: No one at bedside Level of care: Telemetry Medical   Vitals:   09/16/22 0800 09/16/22 1056 09/16/22 1141 09/16/22 1736  BP: (!) 125/42 (!) 112/39 (!) 151/43 (!) 139/59  Pulse: 65  63 62  Resp: 16  18 16   Temp: 98.2 F (36.8 C)  98.1 F (36.7 C) 98.1 F (36.7 C)  TempSrc:      SpO2: 100%  96% 97%  Weight:      Height:         Author: Lynden Oxford, MD 09/16/2022 6:28 PM  Please look on [email or web address] to find out who is on call.

## 2022-09-17 DIAGNOSIS — K625 Hemorrhage of anus and rectum: Secondary | ICD-10-CM | POA: Diagnosis not present

## 2022-09-17 LAB — CBC
HCT: 24.4 % — ABNORMAL LOW (ref 36.0–46.0)
Hemoglobin: 7.8 g/dL — ABNORMAL LOW (ref 12.0–15.0)
MCH: 29.2 pg (ref 26.0–34.0)
MCHC: 32 g/dL (ref 30.0–36.0)
MCV: 91.4 fL (ref 80.0–100.0)
Platelets: 207 10*3/uL (ref 150–400)
RBC: 2.67 MIL/uL — ABNORMAL LOW (ref 3.87–5.11)
RDW: 13.9 % (ref 11.5–15.5)
WBC: 16.1 10*3/uL — ABNORMAL HIGH (ref 4.0–10.5)
nRBC: 0 % (ref 0.0–0.2)

## 2022-09-17 LAB — BASIC METABOLIC PANEL
Anion gap: 5 (ref 5–15)
BUN: 11 mg/dL (ref 8–23)
CO2: 28 mmol/L (ref 22–32)
Calcium: 8.6 mg/dL — ABNORMAL LOW (ref 8.9–10.3)
Chloride: 106 mmol/L (ref 98–111)
Creatinine, Ser: 0.66 mg/dL (ref 0.44–1.00)
GFR, Estimated: >60 mL/min (ref >60)
Glucose, Bld: 106 mg/dL — ABNORMAL HIGH (ref 70–99)
Potassium: 3.3 mmol/L — ABNORMAL LOW (ref 3.5–5.1)
Sodium: 139 mmol/L (ref 135–145)

## 2022-09-17 LAB — MAGNESIUM: Magnesium: 1.9 mg/dL (ref 1.7–2.4)

## 2022-09-17 MED ORDER — CEPHALEXIN 500 MG PO CAPS
500.0000 mg | ORAL_CAPSULE | Freq: Three times a day (TID) | ORAL | Status: DC
  Administered 2022-09-17: 500 mg via ORAL
  Filled 2022-09-17: qty 1

## 2022-09-17 MED ORDER — CEPHALEXIN 500 MG PO CAPS: 500.0000 mg | ORAL_CAPSULE | Freq: Two times a day (BID) | ORAL | 0 refills | Status: AC

## 2022-09-17 MED ORDER — POTASSIUM CHLORIDE CRYS ER 20 MEQ PO TBCR
40.0000 meq | EXTENDED_RELEASE_TABLET | Freq: Once | ORAL | Status: AC
  Administered 2022-09-17: 40 meq via ORAL
  Filled 2022-09-17: qty 2

## 2022-09-17 MED ORDER — MIDODRINE HCL 2.5 MG PO TABS: 2.5000 mg | ORAL_TABLET | Freq: Three times a day (TID) | ORAL | Status: AC | PRN

## 2022-09-17 MED ORDER — POTASSIUM CHLORIDE CRYS ER 10 MEQ PO TBCR: 20.0000 meq | EXTENDED_RELEASE_TABLET | Freq: Two times a day (BID) | ORAL | 0 refills | Status: DC

## 2022-09-17 NOTE — Plan of Care (Signed)

## 2022-09-17 NOTE — Discharge Summary (Signed)
Physician Discharge Summary   Patient: Tracy Long MRN: [id number] DOB: [birth year]  Admit date:     09/13/2022  Discharge date: 09/17/2022  Discharge Physician: Lynden Oxford  PCP: Gracelyn Nurse, MD  Recommendations at discharge: Follow-up with PCP.  Follow-up with GI.  Discussed regarding resumption of aspirin.   Follow-up Information     Gracelyn Nurse, MD. Schedule an appointment as soon as possible for a visit in 1 week(s).   Specialty: Internal Medicine Why: with CBC and BMP Contact information: [address] [place] [address] [phone]         Jaynie Collins, DO. Schedule an appointment as soon as possible for a visit in 2 week(s).   Specialty: Gastroenterology Contact information: [address] [facility] [place] [address] [phone]                Discharge Diagnoses: Principal Problem:   Rectal bleeding Active Problems:   Acute blood loss anemia   Mesenteric arterial embolization (HCC)   Lower GI bleed  Hospital Course: PMH of PMR, Parkinson's, PVD, CAD, chronic A-fib SP ablation, basal cell carcinoma, hypothyroidism, depression present to the hospital with complaints of BRBPR.  GI was consulted.  Underwent colonoscopy without any clear source of active bleeding.  Tagged RBC scan shows evidence of active bleeding at the splenic flexure area.  Vascular surgery was consulted.  Underwent Macrobid embolization of left colonic branch of the IMA and coil embolization on 4/30.  Monitor for recurrent bleed now. Assessment and Plan  Acute lower GI bleed. SP colonoscopy on 4/30 which shows evidence of diverticulosis without any active bleeding. Tagged RBC scan showed evidence of active GI bleed in the splenic flexure area. Vascular surgery was consulted and underwent IMA coiling and embolization. Currently still have old brownish blood in her stool.   Ongoing improving abdominal pain reported after  eating. Monitor H&H which appears to be relatively stable with a slow drift down.   Acute blood loss anemia. Baseline hemoglobin around 12. Currently around 8. Outpatient follow-up with PCP with repeat CBC in 1 week recommended.   History of CAD and PVD. Chest pain. EKG unremarkable for any acute ischemia. Troponins are stable. Aspirin on hold due to presentation with GI bleed. Outpatient follow-up with PCP, GI as well as cardiology with regards to timing of the resumption of the aspirin.   Leukocytosis. Urinary symptoms Likely related to her recent procedure. While leukocytosis is improving due to patient's urinary symptoms we will treat her with oral Keflex for 3 days.   History of Parkinson disease. On carbidopa and levodopa. Monitor.   HTN. Blood pressure stable. Will monitor.   Hypothyroidism. Continue Synthroid.   Hypokalemia. Replaced. Will initiate low-dose oral replacement at home.  Recommend recheck in 1 week.   Iron deficiency. Given her significantly low iron level and low hemoglobin would recommend that the patient receive IV iron.  Consultants:  GI Vascular surgery  Procedures performed:  Colonoscopy Microbead embolization of left colonic branch of the IMA with 1 cc of 500-700  polyvinyl alcohol beads as well as microbead embolization of the splenic flexure branch off of the SMA with a total of 2.5 cc of 500 to 700 m polyvinyl alcohol beads Coil embolization with two 2 mm Ruby coils to the splenic flexure branch off of the left colonic branch of the IMA  DISCHARGE MEDICATION: Allergies as of 09/17/2022       Reactions   Aloe Hives   Bactrim [  sulfamethoxazole-trimethoprim] Nausea Only   Cardizem [diltiazem] Hives   Doxycycline Hives   Macrobid [nitrofurantoin] Hives   Penicillins Hives   Statins Other (See Comments)   Unknown reaction        Medication List     STOP taking these medications    aspirin EC 81 MG tablet   meloxicam 15 MG  tablet Commonly known as: MOBIC       TAKE these medications    acetaminophen 500 MG tablet Commonly known as: TYLENOL Take 1,000 mg by mouth every 6 (six) hours as needed.   calcium carbonate 1500 (600 Ca) MG Tabs tablet Commonly known as: OSCAL Take by mouth 2 (two) times daily with a meal.   carbidopa-levodopa 25-100 MG tablet Commonly known as: SINEMET IR Take 1 tablet by mouth 4 (four) times daily.   cephALEXin 500 MG capsule Commonly known as: KEFLEX Take 1 capsule (500 mg total) by mouth 2 (two) times daily for 3 days.   cetirizine 10 MG tablet Commonly known as: ZYRTEC Take 10 mg by mouth daily.   Cholecalciferol 25 MCG (1000 UT) tablet Take 1,000 Units by mouth daily.   conjugated estrogens 0.625 MG/GM vaginal cream Commonly known as: PREMARIN Place 1 Applicatorful vaginally 3 (three) times a week.   cycloSPORINE 0.05 % ophthalmic emulsion Commonly known as: RESTASIS 1 drop 2 (two) times daily.   docusate sodium 100 MG capsule Commonly known as: COLACE Take 100 mg by mouth 2 (two) times daily.   fluticasone 27.5 MCG/SPRAY nasal spray Commonly known as: VERAMYST Place 2 sprays into the nose daily.   levothyroxine 50 MCG tablet Commonly known as: SYNTHROID Take 50 mcg by mouth daily before breakfast.   metoprolol succinate 25 MG 24 hr tablet Commonly known as: TOPROL-XL Take 12.5 mg by mouth daily.   midodrine 2.5 MG tablet Commonly known as: PROAMATINE Take 1 tablet (2.5 mg total) by mouth 3 (three) times daily as needed (for top number for blood pressure lower than 90). What changed:  when to take this reasons to take this   multivitamin-iron-minerals-folic acid chewable tablet Chew 1 tablet by mouth daily.   PARoxetine 10 MG tablet Commonly known as: PAXIL Take 10 mg by mouth daily.   potassium chloride 10 MEQ tablet Commonly known as: KLOR-CON M Take 2 tablets (20 mEq total) by mouth 2 (two) times daily for 6 days.   PROPYLENE GLYCOL  OP Apply to eye.       Disposition: Home Diet recommendation: Regular diet  Discharge Exam: Vitals:   09/17/22 0357 09/17/22 0400 09/17/22 0759 09/17/22 1151  BP: (!) 138/53  (!) 150/52 117/84  Pulse: (!) 101  79 82  Resp: 18  16 16   Temp: 98.1 F (36.7 C)  98.2 F (36.8 C) 98.2 F (36.8 C)  TempSrc:      SpO2: 94%  100% (!) 89%  Weight:  61.5 kg    Height:       General: Appear in mild distress; no visible Abnormal Neck Mass Or lumps, Conjunctiva normal Cardiovascular: S1 and S2 Present, no Murmur, Respiratory: good respiratory effort, Bilateral Air entry present and CTA, no Crackles, no wheezes Abdomen: Bowel Sound present, nontender today. Extremities: no Pedal edema Neurology: alert and oriented to time, place, and person  Filed Weights   09/12/22 1940 09/16/22 0451 09/17/22 0400  Weight: 61.2 kg 60.9 kg 61.5 kg   Condition at discharge: stable  The results of significant diagnostics from this hospitalization (including imaging, microbiology, ancillary  and laboratory) are listed below for reference.   Imaging Studies: PERIPHERAL VASCULAR CATHETERIZATION  Result Date: 09/14/2022 See surgical note for result.  NM GI Blood Loss  Result Date: 09/14/2022 CLINICAL DATA:  Four episodes of bright red blood per rectum. EXAM: NUCLEAR MEDICINE GASTROINTESTINAL BLEEDING SCAN TECHNIQUE: Sequential abdominal images were obtained following intravenous administration of Tc-3m labeled red blood cells. RADIOPHARMACEUTICALS:  21.88 mCi Tc-49m pertechnetate in-vitro labeled red cells. COMPARISON:  09/13/2018 for CT scan FINDINGS: Evidence of active GI blood loss originating in the region of the splenic flexure region of colon and continuing down the descending colon. IMPRESSION: Active GI blood loss originating in the region of the splenic flexure of the colon. These results will be called to the ordering clinician or representative by the Radiologist Assistant, and communication  documented in the PACS or [employer]. Electronically Signed   By: Rudie Meyer M.D.   On: 09/14/2022 16:26   CT ANGIO GI BLEED  Result Date: 09/13/2022 CLINICAL DATA:  Painless hematochezia EXAM: CTA ABDOMEN AND PELVIS WITHOUT AND WITH CONTRAST TECHNIQUE: Multidetector CT imaging of the abdomen and pelvis was performed using the standard protocol during bolus administration of intravenous contrast. Multiplanar reconstructed images and MIPs were obtained and reviewed to evaluate the vascular anatomy. RADIATION DOSE REDUCTION: This exam was performed according to the departmental dose-optimization program which includes automated exposure control, adjustment of the mA and/or kV according to patient size and/or use of iterative reconstruction technique. CONTRAST:  OMNIPAQUE IOHEXOL 350 MG/ML SOLN COMPARISON:  None Available. FINDINGS: VASCULAR Aorta: Non contrasted images demonstrate no acute intramural hematoma. Nonaneurysmal aorta. No dissection is seen. Moderate aortic atherosclerosis Celiac: Patent without evidence of aneurysm, dissection, vasculitis or significant stenosis. SMA: Patent without evidence of aneurysm, dissection, vasculitis or significant stenosis. Renals: Both renal arteries are patent without evidence of dissection, vasculitis, fibromuscular dysplasia or significant stenosis. Calcification at the origin of left renal artery but no significant stenosis. 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 IMA: Diminutive appearing inferior mesenteric artery, but appears patent on sagittal images. No occlusion or aneurysm. Inflow: Stent within the left external iliac artery which is patent. Mild aortic atherosclerosis. No aneurysm or dissection. Proximal Outflow: Bilateral common femoral and visualized portions of the superficial and profunda femoral arteries are patent without evidence of aneurysm, dissection, vasculitis or significant stenosis. Review of the MIP images confirms the  above findings. NON-VASCULAR Lower chest: Lung bases demonstrate no acute airspace disease. 6 mm left anterior lung base pulmonary nodule, series 16, image 2. Hepatobiliary: No focal liver abnormality is seen. No gallstones, gallbladder wall thickening, or biliary dilatation. Pancreas: Unremarkable. No pancreatic ductal dilatation or surrounding inflammatory changes. Spleen: Normal in size without focal abnormality. Adrenals/Urinary Tract: Adrenal glands are within normal limits. Kidneys show no hydronephrosis. The bladder is unremarkable Stomach/Bowel: The stomach is nonenlarged. No dilated small bowel. Diffuse diverticular disease of the colon without acute wall thickening. Large diverticulum at the cecum. Lymphatic: No suspicious lymph nodes Reproductive: Hysterectomy.  No adnexal mass Other: Negative for pelvic effusion or free air Musculoskeletal: Treated compression deformities at L1 and L2. Suspected chronic superior endplate deformity at L4. Trace retrolisthesis L2 on L3. IMPRESSION: VASCULAR 1. Negative for active intraluminal extravasation to suggest active GI bleeding at this time. 2. Aortic atherosclerosis without evidence for dissection. 3. 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 Extensive diverticular disease of the colon without acute inflammatory changes 2. No CT evidence for acute intra-abdominal or pelvic  abnormality 3. 6 mm left lung base pulmonary nodule. Non-contrast chest CT at 6-12 months is recommended. If the nodule is stable at time of repeat CT, then future CT at 18-24 months (from today's scan) is considered optional for low-risk patients, but is recommended for high-risk patients. This recommendation follows the consensus statement: Guidelines for Management of Incidental Pulmonary Nodules Detected on CT Images: From the Fleischner Society 2017; Radiology 2017; 284:228-243. Electronically Signed   By: Jasmine Pang M.D.   On: 09/13/2022 02:02     Microbiology: Results for orders placed or performed during the hospital encounter of 02/28/21  Urine Culture     Status: Abnormal   Collection Time: 02/28/21 12:37 PM   Specimen: Urine, Clean Catch  Result Value Ref Range Status   Specimen Description   Final    URINE, CLEAN CATCH Performed at [facility], [address], [place], [place] [address]    Special Requests   Final    NONE Performed at [facility], [address], [place], [place] [address]    Culture 10,000 COLONIES/mL ESCHERICHIA COLI (A)  Final   Report Status 03/03/2021 FINAL  Final   Organism ID, Bacteria ESCHERICHIA COLI (A)  Final      Susceptibility   Escherichia coli - MIC*    AMPICILLIN >=32 RESISTANT Resistant     CEFAZOLIN <=4 SENSITIVE Sensitive     CEFEPIME <=0.12 SENSITIVE Sensitive     CEFTRIAXONE <=0.25 SENSITIVE Sensitive     CIPROFLOXACIN >=4 RESISTANT Resistant     GENTAMICIN <=1 SENSITIVE Sensitive     IMIPENEM <=0.25 SENSITIVE Sensitive     NITROFURANTOIN <=16 SENSITIVE Sensitive     TRIMETH/SULFA >=320 RESISTANT Resistant     AMPICILLIN/SULBACTAM 16 INTERMEDIATE Intermediate     PIP/TAZO <=4 SENSITIVE Sensitive     * 10,000 COLONIES/mL ESCHERICHIA COLI   Labs: CBC: Recent Labs  Lab 09/12/22 1943 09/13/22 0026 09/15/22 0323 09/15/22 1932 09/16/22 0506 09/16/22 1648 09/17/22 0513  WBC 5.8   < > 13.3* 20.2* 18.9* 19.0* 16.1*  NEUTROABS 3.5  --  11.2*  --   --   --   --   HGB 12.7   < > 8.6* 8.3* 7.8* 8.2* 7.8*  HCT 39.8   < > 26.7* 25.5* 24.6* 25.6* 24.4*  MCV 91.1   < > 90.2 90.7 91.1 91.1 91.4  PLT 250   < > 195 197 191 209 207   < > = values in this interval not displayed.   Basic Metabolic Panel: Recent Labs  Lab 09/13/22 0600 09/14/22 0515 09/16/22 0506 09/16/22 1648 09/17/22 0513  NA 139 138 137 134* 139  K 3.5 3.8 2.7* 3.9 3.3*  CL 112* 107 103 101 106  CO2 22 28 29 26 28   GLUCOSE 68* 94 92 96 106*  BUN 27* 21 9 14 11   CREATININE  0.58 0.60 0.66 0.66 0.66  CALCIUM 7.4* 8.3* 8.6* 8.5* 8.6*  MG 1.7  --  1.8  --  1.9  PHOS 2.4*  --   --   --   --    Liver Function Tests: Recent Labs  Lab 09/12/22 1943 09/13/22 0600  AST 19 18  ALT <5 10  ALKPHOS 59 37*  BILITOT 0.5 0.5  PROT 6.6 4.9*  ALBUMIN 3.5 2.4*   CBG: No results for input(s): "GLUCAP" in the last 168 hours.  Discharge time spent: greater than 30 minutes.  Signed: Lynden Oxford, MD [facility] 09/17/2022

## 2022-09-21 ENCOUNTER — Encounter: Payer: Self-pay | Admitting: Gastroenterology

## 2022-10-01 ENCOUNTER — Ambulatory Visit
Admission: EM | Admit: 2022-10-01 | Discharge: 2022-10-01 | Disposition: A | Discharge status: Home / Self Care | Payer: Medicare Other | Attending: Physician Assistant | Admitting: Physician Assistant

## 2022-10-01 ENCOUNTER — Encounter: Payer: Self-pay | Admitting: Emergency Medicine

## 2022-10-01 DIAGNOSIS — N3000 Acute cystitis without hematuria: Secondary | ICD-10-CM | POA: Diagnosis present

## 2022-10-01 DIAGNOSIS — R300 Dysuria: Secondary | ICD-10-CM

## 2022-10-01 LAB — URINALYSIS, W/ REFLEX TO CULTURE (INFECTION SUSPECTED)
Bilirubin Urine: NEGATIVE
Glucose, UA: NEGATIVE mg/dL
Nitrite: NEGATIVE
Protein, ur: 100 mg/dL — AB
Specific Gravity, Urine: 1.025 (ref 1.005–1.030)
WBC, UA: >50 WBC/hpf (ref 0–5)
pH: 6 (ref 5.0–8.0)

## 2022-10-01 MED ORDER — CIPROFLOXACIN HCL 250 MG PO TABS: 250.0000 mg | ORAL_TABLET | Freq: Two times a day (BID) | ORAL | 0 refills | Status: AC

## 2022-10-01 NOTE — ED Triage Notes (Signed)
Patient c/o burning when urinating and urinary frequency that started 2 days ago.  Patient denies fevers.  

## 2022-10-01 NOTE — Discharge Instructions (Addendum)

## 2022-10-01 NOTE — ED Provider Notes (Signed)
[facility]    CSN: [id number] Arrival date & time: 10/01/22  4098      History   Chief Complaint Chief Complaint  Patient presents with   Dysuria    HPI Tracy Long is a 88 y.o. female presenting for onset of dysuria, urinary frequency and urgency about a week ago after being admitted for a lower GI bleed.   She was initially treated with "6 Keflex" and says symptoms improved first but recently worsened.  Reports a few episodes of urinary incontinence which she says is unlike her.  No fever or fatigue.  There is a lower back ache that is mild.  No abdominal pain.  Patient reports history of UTIs and believes symptoms are consistent with that.  Patient says she is normally treated with Cipro for UTIs because she has extensive allergy list.  No other complaints.  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   Lower GI bleed 09/15/2022   Acute blood loss anemia 09/14/2022   Rectal bleeding 09/13/2022     Past Surgical History:  Procedure Laterality Date   ABDOMINAL HYSTERECTOMY     BACK SURGERY     BREAST EXCISIONAL BIOPSY Bilateral    [facility]   BREAST SURGERY     COLONOSCOPY WITH PROPOFOL N/A 09/14/2022   Procedure: COLONOSCOPY WITH PROPOFOL;  Surgeon: Jaynie Collins, DO;  Location: [facility] ENDOSCOPY;  Service: Gastroenterology;  Laterality: N/A;   EMBOLIZATION N/A 09/14/2022   Procedure: EMBOLIZATION;  Surgeon: Annice Needy, MD;  Location: [facility] INVASIVE CV LAB;  Service: Cardiovascular;  Laterality: N/A;   EYE SURGERY     JOINT REPLACEMENT Left    Knee   SKIN BIOPSY      OB History   No obstetric history on file.      Home Medications    Prior to Admission medications   Medication Sig Start Date  End Date Taking? Authorizing Provider  ciprofloxacin (CIPRO) 250 MG tablet Take 1 tablet (250 mg total) by mouth every 12 (twelve) hours for 7 days. 10/01/22 10/08/22 Yes Shirlee Latch, PA-C  acetaminophen (TYLENOL) 500 MG tablet Take 1,000 mg by mouth every 6 (six) hours as needed.    [provider]  calcium carbonate (OSCAL) 1500 (600 Ca) MG TABS tablet Take by mouth 2 (two) times daily with a meal.    [provider]  carbidopa-levodopa (SINEMET IR) 25-100 MG tablet Take 1 tablet by mouth 4 (four) times daily. 07/04/20 09/13/22  [provider]  cetirizine (ZYRTEC) 10 MG tablet Take 10 mg by mouth daily.    [provider]  Cholecalciferol 25 MCG (1000 UT) tablet Take 1,000 Units by mouth daily.    [provider]  conjugated estrogens (PREMARIN) vaginal cream Place 1 Applicatorful vaginally 3 (three) times a week.    [provider]  cycloSPORINE (RESTASIS) 0.05 % ophthalmic emulsion 1 drop 2 (two) times daily.    [provider]  docusate sodium (COLACE) 100 MG capsule Take 100 mg by mouth 2 (two) times daily.    [provider]  fluticasone (VERAMYST) 27.5 MCG/SPRAY nasal spray Place 2 sprays into the nose daily.  [provider]  levothyroxine (SYNTHROID) 50 MCG tablet Take 50 mcg by mouth daily before breakfast.    [provider]  metoprolol succinate (TOPROL-XL) 25 MG 24 hr tablet Take 12.5 mg by mouth daily.    [provider]  midodrine (PROAMATINE) 2.5 MG tablet Take 1 tablet (2.5 mg total) by mouth 3 (three) times daily as needed (for top number for blood pressure lower than 90). 09/17/22   Rolly Salter, MD  multivitamin-iron-minerals-folic acid (CENTRUM) chewable tablet Chew 1 tablet by mouth daily.    [provider]  PARoxetine (PAXIL) 10 MG tablet Take 10 mg by mouth daily.    [provider]  potassium chloride (KLOR-CON M) 10 MEQ tablet Take 2 tablets (20 mEq  total) by mouth 2 (two) times daily for 6 days. 09/17/22 09/23/22  Rolly Salter, MD  PROPYLENE GLYCOL OP Apply to eye.    [provider]    Family History Family History  Problem Relation Age of Onset   Breast cancer Neg Hx     Social History Social History   Tobacco Use   Smoking status: Never   Smokeless tobacco: Never  Vaping Use   Vaping Use: Never used  Substance Use Topics   Alcohol use: No   Drug use: No     Allergies   Aloe, Bactrim [sulfamethoxazole-trimethoprim], Cardizem [diltiazem], Doxycycline, Macrobid [nitrofurantoin], Penicillins, and Statins   Review of Systems Review of Systems  Constitutional:  Negative for chills, fatigue and fever.  Gastrointestinal:  Negative for abdominal pain, diarrhea, nausea and vomiting.  Genitourinary:  Positive for dysuria, frequency and urgency. Negative for decreased urine volume, flank pain, hematuria, pelvic pain, vaginal bleeding, vaginal discharge and vaginal pain.  Musculoskeletal:  Positive for back pain.  Skin:  Negative for rash.     Physical Exam Triage Vital Signs ED Triage Vitals  Enc Vitals Group     BP 02/28/21 1115 (!) 150/79     Pulse Rate 02/28/21 1115 72     Resp 02/28/21 1115 16     Temp 02/28/21 1115 98.4 F (36.9 C)     Temp Source 02/28/21 1115 Oral     SpO2 02/28/21 1115 99 %     Weight 02/28/21 1115 130 lb (59 kg)     Height 02/28/21 1115 5\' 4"  (1.626 m)     Head Circumference --      Peak Flow --      Pain Score 02/28/21 1114 0     Pain Loc --      Pain Edu? --      Excl. in GC? --    No data found.  Updated Vital Signs BP 138/80 (BP Location: Left Arm)   Pulse 75   Temp 98 F (36.7 C) (Oral)   Resp 14   Ht 5\' 7"  (1.702 m)   Wt 129 lb (58.5 kg)   SpO2 96%   BMI 20.20 kg/m     Physical Exam Vitals and nursing note reviewed.  Constitutional:      General: She is not in acute distress.    Appearance: Normal appearance. She is not ill-appearing or toxic-appearing.   HENT:     Head: Normocephalic and atraumatic.  Eyes:     General: No scleral icterus.       Right eye: No discharge.        Left eye: No discharge.     Conjunctiva/sclera: Conjunctivae normal.  Cardiovascular:     Rate and  Rhythm: Normal rate and regular rhythm.     Heart sounds: Normal heart sounds.  Pulmonary:     Effort: Pulmonary effort is normal. No respiratory distress.     Breath sounds: Normal breath sounds.  Abdominal:     Palpations: Abdomen is soft.     Tenderness: There is no abdominal tenderness. There is no right CVA tenderness or left CVA tenderness.  Musculoskeletal:     Cervical back: Neck supple.  Skin:    General: Skin is dry.  Neurological:     General: No focal deficit present.     Mental Status: She is alert. Mental status is at baseline.     Motor: No weakness.     Gait: Gait normal.  Psychiatric:        Mood and Affect: Mood normal.        Behavior: Behavior normal.        Thought Content: Thought content normal.      UC Treatments / Results  Labs (all labs ordered are listed, but only abnormal results are displayed) Labs Reviewed  URINALYSIS, W/ REFLEX TO CULTURE (INFECTION SUSPECTED) - Abnormal; Notable for the following components:      Result Value   APPearance CLOUDY (*)    Hgb urine dipstick MODERATE (*)    Ketones, ur TRACE (*)    Protein, ur 100 (*)    Leukocytes,Ua SMALL (*)    Bacteria, UA MANY (*)    All other components within normal limits  URINE CULTURE     EKG   Radiology No results found.  Procedures Procedures (including critical care time)  Medications Ordered in UC Medications - No data to display  Initial Impression / Assessment and Plan / UC Course  I have reviewed the triage vital signs and the nursing notes.  Pertinent labs & imaging results that were available during my care of the patient were reviewed by me and considered in my medical decision making (see chart for details).  88 year old female  presenting for onset of dysuria, urinary frequency and urgency over the past week.  Has been treated with Keflex initially x 3 days.  UA shows g cloudy appearance with moderate hemoglobin, trace ketones, proteins fungicides.  We will send urine for culture and treat with Cipro\. Patient reports allergy to Bactrim, penicillins, Macrobid and doxycycline.  Advised patient we will amend treatment based on culture if needed.  Advised her to increase rest and fluids.  Reviewed ED precautions related to UTIs.  Final Clinical Impressions(s) / UC Diagnoses   Final diagnoses:  Acute cystitis without hematuria  Dysuria     Discharge Instructions      UTI: Based on either symptoms or urinalysis, you may have a urinary tract infection. We will send the urine for culture and call with results in a few days. Begin antibiotics at this time. Your symptoms should be much improved over the next 2-3 days. Increase rest and fluid intake. If for some reason symptoms are worsening or not improving after a couple of days or the urine culture determines the antibiotics you are taking will not treat the infection, the antibiotics may be changed. Return or go to ER for fever, back pain, worsening urinary pain, discharge, increased blood in urine. May take Tylenol or Motrin OTC for pain relief or consider AZO if no contraindications        ED Prescriptions     Medication Sig Dispense Auth. Provider   ciprofloxacin (CIPRO) 250 MG tablet Take  1 tablet (250 mg total) by mouth every 12 (twelve) hours for 7 days. 14 tablet Gareth Morgan      PDMP not reviewed this encounter.     Shirlee Latch, PA-C 10/01/22 1023

## 2022-10-02 LAB — URINE CULTURE: Culture: 80000 — AB

## 2022-10-03 LAB — URINE CULTURE

## 2022-10-05 ENCOUNTER — Telehealth (HOSPITAL_COMMUNITY): Payer: Self-pay | Admitting: Emergency Medicine

## 2022-10-05 MED ORDER — CEPHALEXIN 500 MG PO CAPS: 500.0000 mg | ORAL_CAPSULE | Freq: Two times a day (BID) | ORAL | 0 refills | Status: DC

## 2022-10-06 ENCOUNTER — Telehealth: Payer: Self-pay | Admitting: Emergency Medicine

## 2022-10-06 MED ORDER — CEPHALEXIN 500 MG PO CAPS: 500.0000 mg | ORAL_CAPSULE | Freq: Two times a day (BID) | ORAL | 0 refills | Status: AC

## 2022-10-06 NOTE — Telephone Encounter (Signed)
[place]

## 2022-10-30 ENCOUNTER — Ambulatory Visit
Admission: EM | Admit: 2022-10-30 | Discharge: 2022-10-30 | Disposition: A | Discharge status: Home / Self Care | Payer: Medicare Other | Attending: Emergency Medicine | Admitting: Emergency Medicine

## 2022-10-30 DIAGNOSIS — N390 Urinary tract infection, site not specified: Secondary | ICD-10-CM

## 2022-10-30 LAB — URINALYSIS, W/ REFLEX TO CULTURE (INFECTION SUSPECTED)
Bilirubin Urine: NEGATIVE
Glucose, UA: NEGATIVE mg/dL
Ketones, ur: NEGATIVE mg/dL
Nitrite: NEGATIVE
Protein, ur: 100 mg/dL — AB
RBC / HPF: >50 RBC/hpf (ref 0–5)
Specific Gravity, Urine: 1.025 (ref 1.005–1.030)
pH: 6 (ref 5.0–8.0)

## 2022-10-30 MED ORDER — CEPHALEXIN 500 MG PO CAPS: 500.0000 mg | ORAL_CAPSULE | Freq: Two times a day (BID) | ORAL | 0 refills | Status: AC

## 2022-10-30 MED ORDER — PHENAZOPYRIDINE HCL 200 MG PO TABS: 200.0000 mg | ORAL_TABLET | Freq: Three times a day (TID) | ORAL | 0 refills | Status: DC

## 2022-10-30 NOTE — ED Triage Notes (Signed)
Pt started with urinary burning this morning. Noticed some pink color on toilet tissue

## 2022-10-30 NOTE — ED Provider Notes (Signed)
[facility]    CSN: [id number] Arrival date & time: 10/30/22  1021      History   Chief Complaint Chief Complaint  Patient presents with   Dysuria    HPI Djuana Cockram is a 88 y.o. female.   HPI  76 old female with a past medical history of hypertension, right breast cancer, arthritis, Parkinson's disease, and lower GI bleed presents for evaluation of painful urination with pink tinge on the toilet tissue when she wipes.  Those symptoms began today.  She denies any fever, urgency or frequency of urination, nausea or vomiting, abdominal pain, or low back pain.  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   Lower GI bleed 09/15/2022   Acute blood loss anemia 09/14/2022   Rectal bleeding 09/13/2022    Past Surgical History:  Procedure Laterality Date   ABDOMINAL HYSTERECTOMY     BACK SURGERY     BREAST EXCISIONAL BIOPSY Bilateral    [facility]   BREAST SURGERY     COLONOSCOPY WITH PROPOFOL N/A 09/14/2022   Procedure: COLONOSCOPY WITH PROPOFOL;  Surgeon: Jaynie Collins, DO;  Location: [facility] ENDOSCOPY;  Service: Gastroenterology;  Laterality: N/A;   EMBOLIZATION N/A 09/14/2022   Procedure: EMBOLIZATION;  Surgeon: Annice Needy, MD;  Location: [facility] INVASIVE CV LAB;  Service: Cardiovascular;  Laterality: N/A;   EYE SURGERY     JOINT REPLACEMENT Left    Knee   SKIN BIOPSY      OB History   No obstetric history on file.      Home Medications    Prior to Admission medications   Medication Sig Start Date End Date Taking? Authorizing Provider  cephALEXin (KEFLEX) 500 MG capsule Take 1 capsule (500 mg total) by mouth 2 (two) times daily for 5 days. 10/30/22 11/04/22 Yes Becky Augusta, NP  phenazopyridine (PYRIDIUM)  200 MG tablet Take 1 tablet (200 mg total) by mouth 3 (three) times daily. 10/30/22  Yes Becky Augusta, NP  acetaminophen (TYLENOL) 500 MG tablet Take 1,000 mg by mouth every 6 (six) hours as needed.    [provider]  calcium carbonate (OSCAL) 1500 (600 Ca) MG TABS tablet Take by mouth 2 (two) times daily with a meal.    [provider]  carbidopa-levodopa (SINEMET IR) 25-100 MG tablet Take 1 tablet by mouth 4 (four) times daily. 07/04/20 09/13/22  [provider]  cetirizine (ZYRTEC) 10 MG tablet Take 10 mg by mouth daily.    [provider]  Cholecalciferol 25 MCG (1000 UT) tablet Take 1,000 Units by mouth daily.    [provider]  conjugated estrogens (PREMARIN) vaginal cream Place 1 Applicatorful vaginally 3 (three) times a week.    [provider]  cycloSPORINE (RESTASIS) 0.05 % ophthalmic emulsion 1 drop 2 (two) times daily.    [provider]  docusate sodium (COLACE) 100 MG capsule Take 100 mg by mouth 2 (two) times daily.    [provider]  fluticasone (VERAMYST) 27.5 MCG/SPRAY nasal spray Place 2 sprays into the nose daily.    [provider]  levothyroxine (SYNTHROID) 50 MCG tablet Take 50 mcg by mouth daily before breakfast.  [provider]  metoprolol succinate (TOPROL-XL) 25 MG 24 hr tablet Take 12.5 mg by mouth daily.    [provider]  midodrine (PROAMATINE) 2.5 MG tablet Take 1 tablet (2.5 mg total) by mouth 3 (three) times daily as needed (for top number for blood pressure lower than 90). 09/17/22   Rolly Salter, MD  multivitamin-iron-minerals-folic acid (CENTRUM) chewable tablet Chew 1 tablet by mouth daily.    [provider]  PARoxetine (PAXIL) 10 MG tablet Take 10 mg by mouth daily.    [provider]  potassium chloride (KLOR-CON M) 10 MEQ tablet Take 2 tablets (20 mEq total) by mouth 2 (two) times daily for 6 days. 09/17/22 09/23/22  Rolly Salter, MD   PROPYLENE GLYCOL OP Apply to eye.    [provider]    Family History Family History  Problem Relation Age of Onset   Breast cancer Neg Hx     Social History Social History   Tobacco Use   Smoking status: Never   Smokeless tobacco: Never  Vaping Use   Vaping Use: Never used  Substance Use Topics   Alcohol use: No   Drug use: No     Allergies   Aloe, Bactrim [sulfamethoxazole-trimethoprim], Cardizem [diltiazem], Doxycycline, Macrobid [nitrofurantoin], Penicillins, and Statins   Review of Systems Review of Systems  Constitutional:  Negative for fever.  Genitourinary:  Positive for dysuria and hematuria.     Physical Exam Triage Vital Signs ED Triage Vitals  Enc Vitals Group     BP 10/30/22 1104 (!) 156/76     Pulse Rate 10/30/22 1104 71     Resp 10/30/22 1104 16     Temp 10/30/22 1104 97.8 F (36.6 C)     Temp Source 10/30/22 1104 Oral     SpO2 10/30/22 1104 98 %     Weight 10/30/22 1059 128 lb 15.5 oz (58.5 kg)     Height 10/30/22 1059 5\' 7"  (1.702 m)     Head Circumference --      Peak Flow --      Pain Score 10/30/22 1059 0     Pain Loc --      Pain Edu? --      Excl. in GC? --    No data found.  Updated Vital Signs BP (!) 156/76 (BP Location: Right Arm)   Pulse 71   Temp 97.8 F (36.6 C) (Oral)   Resp 16   Ht 5\' 7"  (1.702 m)   Wt 128 lb 15.5 oz (58.5 kg)   SpO2 98%   BMI 20.20 kg/m   Visual Acuity Right Eye Distance:   Left Eye Distance:   Bilateral Distance:    Right Eye Near:   Left Eye Near:    Bilateral Near:     Physical Exam Vitals and nursing note reviewed.  Constitutional:      Appearance: Normal appearance. She is not ill-appearing.  Cardiovascular:     Rate and Rhythm: Normal rate and regular rhythm.     Pulses: Normal pulses.     Heart sounds: Normal heart sounds. No murmur heard.    No friction rub. No gallop.  Pulmonary:     Effort: Pulmonary effort is normal.     Breath sounds: Normal breath sounds. No  wheezing, rhonchi or rales.  Abdominal:     Tenderness: There is right CVA tenderness and left CVA tenderness.  Skin:    General: Skin is warm and dry.  Capillary Refill: Capillary refill takes less than 2 seconds.     Findings: No rash.  Neurological:     General: No focal deficit present.     Mental Status: She is alert and oriented to person, place, and time.      UC Treatments / Results  Labs (all labs ordered are listed, but only abnormal results are displayed) Labs Reviewed  URINALYSIS, W/ REFLEX TO CULTURE (INFECTION SUSPECTED) - Abnormal; Notable for the following components:      Result Value   APPearance TURBID (*)    Hgb urine dipstick MODERATE (*)    Protein, ur 100 (*)    Leukocytes,Ua MODERATE (*)    Bacteria, UA MANY (*)    All other components within normal limits  URINE CULTURE    EKG   Radiology No results found.  Procedures Procedures (including critical care time)  Medications Ordered in UC Medications - No data to display  Initial Impression / Assessment and Plan / UC Course  I have reviewed the triage vital signs and the nursing notes.  Pertinent labs & imaging results that were available during my care of the patient were reviewed by me and considered in my medical decision making (see chart for details).   Patient is a pleasant, nontoxic-appearing 79 old female presenting for evaluation of UTI symptoms that began this morning.  She has no CVA tenderness on exam and her cardiopulmonary exam is benign.  I will order a UA to look for the presence of UTI.  Urinalysis has a turbid appearance with moderate hemoglobin, 100 protein, and moderate leukocyte esterase but nitrite negative.  Reflex microscopy shows 21-50 WBCs and greater than 50 RBCs with many bacteria.  Urine will reflex to culture.  Patient has had multiple urinary tract infections and they have all grown out E. coli.  Most recently she had a UTI on 10/01/2022 and was initially  prescribed Cipro but was switched to Keflex which was effective.  She does have a hive allergy to penicillins but she states that she did not have any issue with the Keflex.  I will discharge her home on Keflex 500 mg twice daily for 5 days for treatment of UTI pending culture.  I have also prescribed Pyridium to help with urinary discomfort.  Return and ER precautions reviewed.   Final Clinical Impressions(s) / UC Diagnoses   Final diagnoses:  Lower urinary tract infectious disease     Discharge Instructions      Take the Keflex twice daily for 5 days with food for treatment of urinary tract infection.  Use the Pyridium every 8 hours as needed for urinary discomfort.  This will turn your urine a bright red-orange.  Increase your oral fluid intake so that you increase your urine production and or flushing your urinary system.  Take an over-the-counter probiotic, such as Culturelle-Align-Activia, 1 hour after each dose of antibiotic to prevent diarrhea or yeast infections from forming.  We will culture urine and change the antibiotics if necessary.  Return for reevaluation, or see your primary care provider, for any new or worsening symptoms.      ED Prescriptions     Medication Sig Dispense Auth. Provider   cephALEXin (KEFLEX) 500 MG capsule Take 1 capsule (500 mg total) by mouth 2 (two) times daily for 5 days. 10 capsule Becky Augusta, NP   phenazopyridine (PYRIDIUM) 200 MG tablet Take 1 tablet (200 mg total) by mouth 3 (three) times daily. 6 tablet Becky Augusta, NP  PDMP not reviewed this encounter.   Becky Augusta, NP 10/30/22 1140

## 2022-10-30 NOTE — Discharge Instructions (Signed)
Take the Keflex twice daily for 5 days with food for treatment of urinary tract infection.  Use the Pyridium every 8 hours as needed for urinary discomfort.  This will turn your urine a bright red-orange.  Increase your oral fluid intake so that you increase your urine production and or flushing your urinary system.  Take an over-the-counter probiotic, such as Culturelle-Align-Activia, 1 hour after each dose of antibiotic to prevent diarrhea or yeast infections from forming.  We will culture urine and change the antibiotics if necessary.  Return for reevaluation, or see your primary care provider, for any new or worsening symptoms.  

## 2022-10-31 LAB — URINE CULTURE: Culture: <10000 — AB

## 2022-12-10 ENCOUNTER — Other Ambulatory Visit: Payer: Self-pay | Admitting: Orthopedic Surgery

## 2022-12-14 ENCOUNTER — Encounter
Admission: RE | Admit: 2022-12-14 | Discharge: 2022-12-14 | Disposition: A | Discharge status: Home / Self Care | Payer: Medicare Other | Source: Ambulatory Visit | Attending: Orthopedic Surgery | Admitting: Orthopedic Surgery

## 2022-12-14 DIAGNOSIS — Z01810 Encounter for preprocedural cardiovascular examination: Secondary | ICD-10-CM | POA: Insufficient documentation

## 2022-12-14 DIAGNOSIS — Z01818 Encounter for other preprocedural examination: Secondary | ICD-10-CM | POA: Diagnosis present

## 2022-12-14 DIAGNOSIS — Z01812 Encounter for preprocedural laboratory examination: Secondary | ICD-10-CM | POA: Insufficient documentation

## 2022-12-14 HISTORY — DX: Polymyalgia rheumatica: M35.3

## 2022-12-14 HISTORY — DX: Personal history of urinary (tract) infections: Z87.440

## 2022-12-14 HISTORY — DX: Gastro-esophageal reflux disease without esophagitis: K21.9

## 2022-12-14 HISTORY — DX: Diverticulosis of intestine, part unspecified, without perforation or abscess without bleeding: K57.90

## 2022-12-14 HISTORY — DX: Rheumatoid arthritis, unspecified: M06.9

## 2022-12-14 HISTORY — DX: Palpitations: R00.2

## 2022-12-14 HISTORY — DX: Raynaud's syndrome without gangrene: I73.00

## 2022-12-14 HISTORY — DX: Atherosclerotic heart disease of native coronary artery without angina pectoris: I25.10

## 2022-12-14 HISTORY — DX: Supraventricular tachycardia, unspecified: I47.10

## 2022-12-14 HISTORY — DX: Depression, unspecified: F32.A

## 2022-12-14 HISTORY — DX: Peripheral vascular disease, unspecified: I73.9

## 2022-12-14 LAB — CBC WITH DIFFERENTIAL/PLATELET
Abs Immature Granulocytes: 0.01 10*3/uL (ref 0.00–0.07)
Basophils Absolute: 0.1 10*3/uL (ref 0.0–0.1)
Basophils Relative: 1 %
Eosinophils Absolute: 0.1 10*3/uL (ref 0.0–0.5)
Eosinophils Relative: 2 %
HCT: 42.9 % (ref 36.0–46.0)
Hemoglobin: 13.4 g/dL (ref 12.0–15.0)
Immature Granulocytes: 0 %
Lymphocytes Relative: 31 %
Lymphs Abs: 1.6 10*3/uL (ref 0.7–4.0)
MCH: 28 pg (ref 26.0–34.0)
MCHC: 31.2 g/dL (ref 30.0–36.0)
MCV: 89.6 fL (ref 80.0–100.0)
Monocytes Absolute: 0.6 10*3/uL (ref 0.1–1.0)
Monocytes Relative: 12 %
Neutro Abs: 2.6 10*3/uL (ref 1.7–7.7)
Neutrophils Relative %: 54 %
Platelets: 273 10*3/uL (ref 150–400)
RBC: 4.79 MIL/uL (ref 3.87–5.11)
RDW: 13.1 % (ref 11.5–15.5)
WBC: 5 10*3/uL (ref 4.0–10.5)
nRBC: 0 % (ref 0.0–0.2)

## 2022-12-14 LAB — COMPREHENSIVE METABOLIC PANEL
ALT: 6 U/L (ref 0–44)
AST: 20 U/L (ref 15–41)
Albumin: 3.6 g/dL (ref 3.5–5.0)
Alkaline Phosphatase: 53 U/L (ref 38–126)
Anion gap: 7 (ref 5–15)
BUN: 30 mg/dL — ABNORMAL HIGH (ref 8–23)
CO2: 31 mmol/L (ref 22–32)
Calcium: 9.8 mg/dL (ref 8.9–10.3)
Chloride: 101 mmol/L (ref 98–111)
Creatinine, Ser: 0.93 mg/dL (ref 0.44–1.00)
GFR, Estimated: 60 mL/min — ABNORMAL LOW (ref >60)
Glucose, Bld: 59 mg/dL — ABNORMAL LOW (ref 70–99)
Potassium: 3.7 mmol/L (ref 3.5–5.1)
Sodium: 139 mmol/L (ref 135–145)
Total Bilirubin: 0.4 mg/dL (ref 0.3–1.2)
Total Protein: 7.1 g/dL (ref 6.5–8.1)

## 2022-12-14 LAB — TYPE AND SCREEN
ABO/RH(D): O POS
Antibody Screen: NEGATIVE

## 2022-12-14 LAB — URINALYSIS, ROUTINE W REFLEX MICROSCOPIC
Bilirubin Urine: NEGATIVE
Glucose, UA: NEGATIVE mg/dL
Hgb urine dipstick: NEGATIVE
Ketones, ur: 5 mg/dL — AB
Leukocytes,Ua: NEGATIVE
Nitrite: NEGATIVE
Protein, ur: NEGATIVE mg/dL
Specific Gravity, Urine: 1.018 (ref 1.005–1.030)
pH: 5 (ref 5.0–8.0)

## 2022-12-14 LAB — SURGICAL PCR SCREEN
MRSA, PCR: NEGATIVE
Staphylococcus aureus: NEGATIVE

## 2022-12-14 NOTE — Patient Instructions (Addendum)
Your procedure is scheduled on: Monday, August 5 Report to the Registration Desk on the 1st floor of the [facility] To find out your arrival time, please call [phone] between 1PM - 3PM on: Friday, August 2 If your arrival time is 6:00 am, do not arrive before that time as the [facility] entrance doors do not open until 6:00 am.  REMEMBER: Instructions that are not followed completely may result in serious medical risk, up to and including death; or upon the discretion of your surgeon and anesthesiologist your surgery may need to be rescheduled.  Do not eat food after midnight the night before surgery.  No gum chewing or hard candies.  You may however, drink CLEAR liquids up to 2 hours before you are scheduled to arrive for your surgery. Do not drink anything within 2 hours of your scheduled arrival time.  Clear liquids include: - water  - apple juice without pulp - gatorade (not RED colors) - black coffee or tea (Do NOT add milk or creamers to the coffee or tea) Do NOT drink anything that is not on this list.  In addition, your doctor has ordered for you to drink the provided:  Ensure Pre-Surgery Clear Carbohydrate Drink  Drinking this carbohydrate drink up to two hours before surgery helps to reduce insulin resistance and improve patient outcomes. Please complete drinking 2 hours before scheduled arrival time.  One week prior to surgery: starting today, July 30 Stop Anti-inflammatories (NSAIDS) such as Advil, Aleve, Ibuprofen, Motrin, Naproxen, Naprosyn and Aspirin based products such as Excedrin, Goody's Powder, BC Powder. Stop ANY OVER THE COUNTER supplements until after surgery. You may however, continue to take Tylenol if needed for pain up until the day of surgery.  Continue taking all prescribed medications   TAKE ONLY THESE MEDICATIONS THE MORNING OF SURGERY WITH A SIP OF WATER:  CARBIDOPA-LEVODOPA LEVOTHYROXINE METOPROLOL  No Alcohol for 24 hours before or  after surgery.  No Smoking including e-cigarettes for 24 hours before surgery.  No chewable tobacco products for at least 6 hours before surgery.  No nicotine patches on the day of surgery.  Do not use any "recreational" drugs for at least a week (preferably 2 weeks) before your surgery.  Please be advised that the combination of cocaine and anesthesia may have negative outcomes, up to and including death. If you test positive for cocaine, your surgery will be cancelled.  On the morning of surgery brush your teeth with toothpaste and water, you may rinse your mouth with mouthwash if you wish. Do not swallow any toothpaste or mouthwash.  Use CHG Soap as directed on instruction sheet.  Do not wear jewelry, make-up, hairpins, clips or nail polish.  Do not wear lotions, powders, or perfumes.   Do not shave body hair from the neck down 48 hours before surgery.  Contact lenses, hearing aids and dentures may not be worn into surgery.  Do not bring valuables to the hospital. [facility] is not responsible for any missing/lost belongings or valuables.   Notify your doctor if there is any change in your medical condition (cold, fever, infection).  Wear comfortable clothing (specific to your surgery type) to the hospital.  After surgery, you can help prevent lung complications by doing breathing exercises.  Take deep breaths and cough every 1-2 hours. Your doctor may order a device called an Incentive Spirometer to help you take deep breaths.  If you are being admitted to the hospital overnight, leave your suitcase in  the car. After surgery it may be brought to your room.  In case of increased patient census, it may be necessary for you, the patient, to continue your postoperative care in the Same Day Surgery department.  If you are being discharged the day of surgery, you will not be allowed to drive home. You will need a responsible individual to drive you home and stay with you for 24  hours after surgery.   If you are taking public transportation, you will need to have a responsible individual with you.  Please call the Pre-admissions Testing Dept. at [phone] if you have any questions about these instructions.  Surgery Visitation Policy:  Patients having surgery or a procedure may have two visitors.  Children under the age of 43 must have an adult with them who is not the patient.  Inpatient Visitation:    Visiting hours are 7 a.m. to 8 p.m. Up to four visitors are allowed at one time in a patient room. The visitors may rotate out with other people during the day.  One visitor age 74 or older may stay with the patient overnight and must be in the room by 8 p.m.    Pre-operative 5 CHG Bath Instructions   You can play a key role in reducing the risk of infection after surgery. Your skin needs to be as free of germs as possible. You can reduce the number of germs on your skin by washing with CHG (chlorhexidine gluconate) soap before surgery. CHG is an antiseptic soap that kills germs and continues to kill germs even after washing.   DO NOT use if you have an allergy to chlorhexidine/CHG or antibacterial soaps. If your skin becomes reddened or irritated, stop using the CHG and notify one of our RNs at [phone].   Please shower with the CHG soap starting 4 days before surgery using the following schedule:     Please keep in mind the following:  DO NOT shave, including legs and underarms, starting the day of your first shower.   You may shave your face at any point before/day of surgery.  Place clean sheets on your bed the day you start using CHG soap. Use a clean washcloth (not used since being washed) for each shower. DO NOT sleep with pets once you start using the CHG.   CHG Shower Instructions:  If you choose to wash your hair and private area, wash first with your normal shampoo/soap.  After you use shampoo/soap, rinse your hair and body thoroughly  to remove shampoo/soap residue.  Turn the water OFF and apply about 3 tablespoons (45 ml) of CHG soap to a CLEAN washcloth.  Apply CHG soap ONLY FROM YOUR NECK DOWN TO YOUR TOES (washing for 3-5 minutes)  DO NOT use CHG soap on face, private areas, open wounds, or sores.  Pay special attention to the area where your surgery is being performed.  If you are having back surgery, having someone wash your back for you may be helpful. Wait 2 minutes after CHG soap is applied, then you may rinse off the CHG soap.  Pat dry with a clean towel  Put on clean clothes/pajamas   If you choose to wear lotion, please use ONLY the CHG-compatible lotions on the back of this paper.     Additional instructions for the day of surgery: DO NOT APPLY any lotions, deodorants, cologne, or perfumes.   Put on clean/comfortable clothes.  Brush your teeth.  Ask your nurse before  applying any prescription medications to the skin.      CHG Compatible Lotions   Aveeno Moisturizing lotion  Cetaphil Moisturizing Cream  Cetaphil Moisturizing Lotion  Clairol Herbal Essence Moisturizing Lotion, Dry Skin  Clairol Herbal Essence Moisturizing Lotion, Extra Dry Skin  Clairol Herbal Essence Moisturizing Lotion, Normal Skin  Curel Age Defying Therapeutic Moisturizing Lotion with Alpha Hydroxy  Curel Extreme Care Body Lotion  Curel Soothing Hands Moisturizing Hand Lotion  Curel Therapeutic Moisturizing Cream, Fragrance-Free  Curel Therapeutic Moisturizing Lotion, Fragrance-Free  Curel Therapeutic Moisturizing Lotion, Original Formula  Eucerin Daily Replenishing Lotion  Eucerin Dry Skin Therapy Plus Alpha Hydroxy Crme  Eucerin Dry Skin Therapy Plus Alpha Hydroxy Lotion  Eucerin Original Crme  Eucerin Original Lotion  Eucerin Plus Crme Eucerin Plus Lotion  Eucerin TriLipid Replenishing Lotion  Keri Anti-Bacterial Hand Lotion  Keri Deep Conditioning Original Lotion Dry Skin Formula Softly Scented  Keri Deep  Conditioning Original Lotion, Fragrance Free Sensitive Skin Formula  Keri Lotion Fast Absorbing Fragrance Free Sensitive Skin Formula  Keri Lotion Fast Absorbing Softly Scented Dry Skin Formula  Keri Original Lotion  Keri Skin Renewal Lotion Keri Silky Smooth Lotion  Keri Silky Smooth Sensitive Skin Lotion  Nivea Body Creamy Conditioning Oil  Nivea Body Extra Enriched Lotion  Nivea Body Original Lotion  Nivea Body Sheer Moisturizing Lotion Nivea Crme  Nivea Skin Firming Lotion  NutraDerm 30 Skin Lotion  NutraDerm Skin Lotion  NutraDerm Therapeutic Skin Cream  NutraDerm Therapeutic Skin Lotion  ProShield Protective Hand Cream  Provon moisturizing lotion  Preoperative Educational Videos for Total Hip, Knee and Shoulder Replacements  To better prepare for surgery, please view our videos that explain the physical activity and discharge planning required to have the best surgical recovery at [facility].  [email or web address]  Questions? Call [phone] or email [email or web address]

## 2022-12-15 ENCOUNTER — Encounter: Payer: Self-pay | Admitting: Orthopedic Surgery

## 2022-12-15 NOTE — Progress Notes (Signed)
Perioperative / Anesthesia Services  Pre-Admission Testing Clinical Review / Preoperative Anesthesia Consult  Date: 12/16/22  Patient Demographics:  Name: Tracy Long DOB:   July 06, 1934 MRN:   [id number]  Planned Surgical Procedure(s):    Case: [id number] Date/Time: 12/20/22 1247   Procedure: TOTAL KNEE ARTHROPLASTY (Right: Knee)   Anesthesia type: Choice   Pre-op diagnosis: Primary osteoarthritis of right knee M17.11   Location: [facility] OR ROOM 02 / [facility]   Surgeons: Reinaldo Berber, MD     NOTE: Available PAT nursing documentation and vital signs have been reviewed. Clinical nursing staff has updated patient's PMH/PSHx, current medication list, and drug allergies/intolerances to ensure comprehensive history available to assist in medical decision making as it pertains to the aforementioned surgical procedure and anticipated anesthetic course. Extensive review of available clinical information personally performed. [place] PMH and PSHx updated with any diagnoses/procedures that  may have been inadvertently omitted during her intake with the pre-admission testing department's nursing staff.  Clinical Discussion:  Tracy Long is a 88 y.o. female who is submitted for pre-surgical anesthesia review and clearance prior to her undergoing the above procedure. Patient has never been a smoker. Pertinent PMH includes: CAD, MI, type II NSTEMI, SVT/AVRT, palpitations, PVD, postoperative pulmonary embolism, HTN, HLD, hypothyroidism, GERD (no daily Tx), iron deficiency, Parkinson's disease, polymyalgia rheumatica, OA, RA, Raynaud's disease, depression, mild cognitive impairment.  Patient is followed by cardiology Juliann Pares, MD). She was last seen in the cardiology clinic on 11/26/2022; notes reviewed. At the time of her clinic visit, patient doing well overall from a cardiovascular perspective. Patient denied any chest pain, shortness of breath, PND, orthopnea,  palpitations, significant peripheral edema, weakness, fatigue, vertiginous symptoms, or presyncope/syncope. Patient with a past medical history significant for cardiovascular diagnoses. Documented physical exam was grossly benign, providing no evidence of acute exacerbation and/or decompensation of the patient's known cardiovascular conditions.  Of note, patient's complete records regarding her cardiovascular history unavailable for review at time of consult.  Patient reported to have suffered a myocardial infarction back in 1982.  Records regarding cardiovascular event unavailable for review.  Patient denies that PCI was required.  Patient with a history of significant peripheral vascular disease.  She underwent stenting of her BILATERAL femoral arteries in approximately 2000.  Patient experienced a type II NSTEMI secondary to AVRT on 08/31/2005.  Diagnostic LEFT heart catheterization was performed on 09/01/2005 revealing normal coronary anatomy with no evidence of obstructive coronary artery disease.  Patient subsequently underwent cardiac ablation for the AVRT (2007).  Most recent TTE was performed on 11/03/2016 revealing a normal left ventricular systolic function with most recent TTE was performed on 11/03/2016 revealing a normal left ventricular systolic function with an EF of >55%.  There were no regional wall motion abnormalities.  Left atrium is mildly enlarged.  There was mild paravalvular regurgitation.  All transvalvular gradients were noted to be normal providing no evidence suggestive of valvular stenosis.  RVSP 33.9 mmHg.  Aorta normal in size with no evidence of aneurysmal dilatation.  Most recent myocardial perfusion imaging study was performed on 11/03/2016 revealing normal left ventricular systolic function with EF of 66%.  There was no evidence of stress-induced myocardial ischemia or arrhythmia; no scintigraphic evidence of scar.  Study determined to be normal and low risk.  Patient  experienced a significant lower GI bleed on 09/13/2022.  Patient underwent vascular intervention on 09/14/2022 at which time microbead embolization of the LEFT colonic branch of the IMA and splenic  branch of the SMA. Additionally, coil embolization with a 2 mm Ruby coil to the splenic flexure branch off of the LEFT colonic branch of the IMA was also performed.  Blood pressure well controlled at 120/84 mmHg on currently prescribed beta-blocker (metoprolol succinate) monotherapy.  Patient does take midodrine for labile blood pressures.  Patient is on an omega-3 fatty acid for her HLD diagnosis and ASCVD prevention. Patient is not diabetic She does not have an OSAH diagnosis. Functional capacity is somewhat limited by patient's age and multiple medical comorbidities.  With that said, patient is able to complete all of her ADL/IADLs without cardiovascular limitation.  Per the DASI, patient is able to achieve at least 4 METS of physical activity without experiencing any significant degree of angina/anginal equivalent symptoms. No changes were made to her medication regimen during her visit with cardiology.  Patient scheduled to follow-up with outpatient cardiology in 6 months or sooner if needed.  Tracy Long is scheduled for an elective TOTAL KNEE ARTHROPLASTY (Right: Knee) on 12/20/2022 with Dr. Reinaldo Berber, MD.  Given patient's past medical history significant for cardiovascular diagnoses, presurgical cardiac clearance was sought by the PAT team. Per cardiology, "this patient is optimized for surgery and may proceed with the planned procedural course with a LOW risk of significant perioperative cardiovascular complications".    In review of her medication reconciliation, the patient is not noted to be taking any type of anticoagulation or antiplatelet therapies that would need to be held during her perioperative course.  Patient denies previous perioperative complications with anesthesia in the  past. In review of the available records, it is noted that patient underwent a general anesthetic course here at [facility] ([facility]) in 08/2022 without documented complications.      12/14/2022   10:08 AM 10/30/2022   11:04 AM 10/30/2022   10:59 AM  Vitals with BMI  Height 5\' 7"   5\' 7"   Weight 133 lbs  129 lbs  BMI 20.83  20.19  Systolic 146 156   Diastolic 64 76   Pulse 72 71     Providers/Specialists:   NOTE: Primary physician provider listed below. Patient may have been seen by APP or partner within same practice.   PROVIDER ROLE / SPECIALTY LAST Wilber Bihari, MD Orthopedics (Surgeon) 12/10/2022  Gracelyn Nurse, MD Primary Care Provider 09/24/2022  Rudean Hitt, MD Cardiology 11/26/2022  Cristopher Peru, MD Neurology 11/29/2022   Allergies:  Aloe, Bactrim [sulfamethoxazole-trimethoprim], Cardizem [diltiazem], Doxycycline, Macrobid [nitrofurantoin], Penicillins, and Statins  Current Home Medications:   No current facility-administered medications for this encounter.    acetaminophen (TYLENOL) 325 MG suppository   Apoaequorin (PREVAGEN) 10 MG CAPS   Bacillus Coagulans-Inulin (ALIGN PREBIOTIC-PROBIOTIC PO)   Calcium-Magnesium-Zinc 333-133-5 MG TABS   carbidopa-levodopa (SINEMET IR) 25-100 MG tablet   cetirizine (ZYRTEC) 10 MG tablet   Cholecalciferol (VITAMIN D) 125 MCG (5000 UT) CAPS   conjugated estrogens (PREMARIN) vaginal cream   cycloSPORINE (RESTASIS) 0.05 % ophthalmic emulsion   diclofenac Sodium (VOLTAREN ARTHRITIS PAIN) 1 % GEL   fluticasone (FLONASE) 50 MCG/ACT nasal spray   levothyroxine (SYNTHROID) 50 MCG tablet   meloxicam (MOBIC) 15 MG tablet   metoprolol succinate (TOPROL-XL) 25 MG 24 hr tablet   midodrine (PROAMATINE) 2.5 MG tablet   Multiple Vitamins-Minerals (CENTRUM SILVER 50+WOMEN PO)   Multiple Vitamins-Minerals (PRESERVISION AREDS 2) CAPS   Omega-3 Fatty Acids (FISH OIL) 1200 MG CAPS   PARoxetine  (PAXIL) 10 MG tablet  Polyethyl Glycol-Propyl Glycol 0.4-0.3 % SOLN   polyethylene glycol powder (GLYCOLAX/MIRALAX) 17 GM/SCOOP powder   History:   Past Medical History:  Diagnosis Date   Arthritis    Chronically dry eyes    a.) on cyclosporine gtts   Compression fracture of lumbar vertebra (HCC)    a.) s/p L1-L2 kyphoplasty   Coronary artery disease involving native coronary artery of native heart without angina pectoris    a.) s/p MI 1982; b.) s/p type II NSTEMI secondary to AVRT 08/31/2005: LHC 09/01/2005 - normal coronaries; c.) MV 11/03/2016: no ischemia   Depression    Diverticulosis    GERD (gastroesophageal reflux disease)    H/O bladder infections    History of bilateral cataract extraction 2003   History of right breast cancer 2006   a.) CNB (+) ADH with suspicion of DCIS --> s/p partial mastectomy 05/06/2001 (DCIS)   HLD (hyperlipidemia)    Hypertension    Hypothyroidism    Inverted nipple    Iron deficiency    Lower GI bleed 09/13/2022   a.) Tx'd by vascular 09/14/2022 --> s/p microbead embolization of LEFT colonic branch of IMA and splenic branch of SMA + coil embolization with 2 mm Ruby coils to splenic flexure branch off of LEFT colonic branch of IMA   MCI (mild cognitive impairment)    a.) takes apoequorin   Myocardial infarction (HCC) 1982   Osteopenia    Palpitations    Parkinson disease    Polymyalgia rheumatica (HCC)    Postoperative pulmonary embolism (HCC) 2013   a.) s/p LEFT TKA   PVD (peripheral vascular disease) (HCC)    a.) s/p BILATERAL femoral stent placement in 2000   Raynaud's disease    Rheumatoid arthritis (HCC)    Supraventricular tachycardia    a.) s/p ablation for AVRT 2007; b.) rate/rhythm currently controlled with oral metoprolol succinate   Type 2 myocardial infarction (HCC) 08/31/2005   a.) Type II NSTEMI r/t demand ischemia secondary to SVT/AVRT; b.) LHC 09/01/2005 - normal coronaries; no sig CAD.   Past Surgical History:   Procedure Laterality Date   BILATERAL FEMORAL STENTS Bilateral 2000   BREAST EXCISIONAL BIOPSY Bilateral    [facility] (BENIGN MASS)   BREAST LUMPECTOMY Right 04/2005   CARDIAC CATHETERIZATION  09/01/2005   CATARACT EXTRACTION W/ INTRAOCULAR LENS  IMPLANT, BILATERAL Bilateral 2003   COLONOSCOPY WITH PROPOFOL N/A 09/14/2022   Procedure: COLONOSCOPY WITH PROPOFOL;  Surgeon: Jaynie Collins, DO;  Location: [facility];  Service: Gastroenterology;  Laterality: N/A;   EMBOLIZATION N/A 09/14/2022   Procedure: EMBOLIZATION;  Surgeon: Annice Needy, MD;  Location: [facility] INVASIVE CV LAB;  Service: Cardiovascular;  Laterality: N/A;   EPIRETINAL MEMBRANE REMOVED Bilateral    ESOPHAGOGASTRODUODENOSCOPY  08/21/2012   L1-L2 KYPHOPLASTY N/A    POSTERIOR TIBIAL TENDON REPAIR Left    SKIN BIOPSY     SVT ABLATION N/A 2007   TEMPORAL ARTERY BIOPSY / LIGATION     TOTAL ABDOMINAL HYSTERECTOMY W/ BILATERAL SALPINGOOPHORECTOMY  1985   TOTAL KNEE ARTHROPLASTY Left 01/10/2012   TUBAL LIGATION     Family History  Problem Relation Age of Onset   Breast cancer Neg Hx    Social History   Tobacco Use   Smoking status: Never   Smokeless tobacco: Never  Vaping Use   Vaping status: Never Used  Substance Use Topics   Alcohol use: No   Drug use: No    Pertinent Clinical Results:  LABS:   Hospital Outpatient Visit  on 12/14/2022  Component Date Value Ref Range Status   WBC 12/14/2022 5.0  4.0 - 10.5 K/uL Final   RBC 12/14/2022 4.79  3.87 - 5.11 MIL/uL Final   Hemoglobin 12/14/2022 13.4  12.0 - 15.0 g/dL Final   HCT 14/78/2956 42.9  36.0 - 46.0 % Final   MCV 12/14/2022 89.6  80.0 - 100.0 fL Final   MCH 12/14/2022 28.0  26.0 - 34.0 pg Final   MCHC 12/14/2022 31.2  30.0 - 36.0 g/dL Final   RDW 21/30/8657 13.1  11.5 - 15.5 % Final   Platelets 12/14/2022 273  150 - 400 K/uL Final   nRBC 12/14/2022 0.0  0.0 - 0.2 % Final   Neutrophils Relative % 12/14/2022 54  % Final   Neutro Abs 12/14/2022 2.6  1.7 -  7.7 K/uL Final   Lymphocytes Relative 12/14/2022 31  % Final   Lymphs Abs 12/14/2022 1.6  0.7 - 4.0 K/uL Final   Monocytes Relative 12/14/2022 12  % Final   Monocytes Absolute 12/14/2022 0.6  0.1 - 1.0 K/uL Final   Eosinophils Relative 12/14/2022 2  % Final   Eosinophils Absolute 12/14/2022 0.1  0.0 - 0.5 K/uL Final   Basophils Relative 12/14/2022 1  % Final   Basophils Absolute 12/14/2022 0.1  0.0 - 0.1 K/uL Final   Immature Granulocytes 12/14/2022 0  % Final   Abs Immature Granulocytes 12/14/2022 0.01  0.00 - 0.07 K/uL Final   Performed at [facility], [address] [place], [place] [address]   Sodium 12/14/2022 139  135 - 145 mmol/L Final   Potassium 12/14/2022 3.7  3.5 - 5.1 mmol/L Final   Chloride 12/14/2022 101  98 - 111 mmol/L Final   CO2 12/14/2022 31  22 - 32 mmol/L Final   Glucose, Bld 12/14/2022 59 (L)  70 - 99 mg/dL Final   Glucose reference range applies only to samples taken after fasting for at least 8 hours.   BUN 12/14/2022 30 (H)  8 - 23 mg/dL Final   Creatinine, Ser 12/14/2022 0.93  0.44 - 1.00 mg/dL Final   Calcium 29/52/8413 9.8  8.9 - 10.3 mg/dL Final   Total Protein 24/40/1027 7.1  6.5 - 8.1 g/dL Final   Albumin 25/36/6440 3.6  3.5 - 5.0 g/dL Final   AST 34/74/2595 20  15 - 41 U/L Final   ALT 12/14/2022 6  0 - 44 U/L Final   Alkaline Phosphatase 12/14/2022 53  38 - 126 U/L Final   Total Bilirubin 12/14/2022 0.4  0.3 - 1.2 mg/dL Final   GFR, Estimated 12/14/2022 60 (L)  >60 mL/min Final   Comment: (NOTE) Calculated using the CKD-EPI Creatinine Equation (2021)    Anion gap 12/14/2022 7  5 - 15 Final   Performed at [facility], [address] [place], [place] [address]   Color, Urine 12/14/2022 YELLOW (A)  YELLOW Final   APPearance 12/14/2022 HAZY (A)  CLEAR Final   Specific Gravity, Urine 12/14/2022 1.018  1.005 - 1.030 Final   pH 12/14/2022 5.0  5.0 - 8.0 Final   Glucose, UA 12/14/2022 NEGATIVE  NEGATIVE mg/dL Final   Hgb urine  dipstick 12/14/2022 NEGATIVE  NEGATIVE Final   Bilirubin Urine 12/14/2022 NEGATIVE  NEGATIVE Final   Ketones, ur 12/14/2022 5 (A)  NEGATIVE mg/dL Final   Protein, ur 64/33/2951 NEGATIVE  NEGATIVE mg/dL Final   Nitrite 88/41/6606 NEGATIVE  NEGATIVE Final   Leukocytes,Ua 12/14/2022 NEGATIVE  NEGATIVE Final   Performed at  [facility], [address] [place], [place] [address]   ABO/RH(D) 12/14/2022 O POS   Final   Antibody Screen 12/14/2022 NEG   Final   Sample Expiration 12/14/2022 12/28/2022,2359   Final   Extend sample reason 12/14/2022    Final                   Value:NO TRANSFUSIONS OR PREGNANCY IN THE PAST 3 MONTHS Performed at [facility], [address] [place], [place] [address]    MRSA, PCR 12/14/2022 NEGATIVE  NEGATIVE Final   Staphylococcus aureus 12/14/2022 NEGATIVE  NEGATIVE Final   Comment: (NOTE) The Xpert SA Assay (FDA approved for NASAL specimens in patients 42 years of age and older), is one component of a comprehensive surveillance program. It is not intended to diagnose infection nor to guide or monitor treatment. Performed at [facility], [address] [place], [place] [address]     ECG: Date: 12/14/2022 Time ECG obtained: 1027 AM Rate: 62 bpm Rhythm: normal sinus Axis (leads I and aVF): Normal Intervals: PR 192 ms. QRS 88 ms. QTc 406 ms. ST segment and T wave changes: Nonspecific anterior ST segment changes.  Previous T wave changes in lateral leads have resolved.  QT has shortened.   Comparison: Similar to previous tracing obtained on 09/15/2022   IMAGING / PROCEDURES: DIAGNOSTIC RADIOGRAPHS OF RIGHT KNEE 3 VIEWS performed on 12/10/2022 Severe degenerative changes with medial and lateral joint space narrowing and osteophyte formation, subchondral cysts and sclerosis.   There is valgus deformity of the right knee measuring 10 degrees.   AP, sunrise, and flexed PA of the left knee also show status post total knee  arthroplasty with components in appropriate appearing position without any evidence of periprosthetic fracture or loosening.   MYOCARDIAL PERFUSION IMAGING STUDY (LEXISCAN) performed on 11/03/2016 Normal left ventricular systolic function with a normal LVEF of 66% Normal myocardial thickening and wall motion Left ventricular cavity size normal SPECT images demonstrate homogenous tracer distribution throughout the myocardium No evidence of stress-induced myocardial ischemia or arrhythmia Normal low risk study  TRANSTHORACIC ECHOCARDIOGRAM performed on 11/03/2016 Normal left ventricular systolic function with an EF of >55% No regional wall motion abnormalities Left atrium mildly enlarged Mild pan valvular regurgitation Normal gradients; no valvular stenosis No pericardial effusion  Impression and Plan:  Tracy Long has been referred for pre-anesthesia review and clearance prior to her undergoing the planned anesthetic and procedural courses. Available labs, pertinent testing, and imaging results were personally reviewed by me in preparation for upcoming operative/procedural course. [facility] medical record has been updated following extensive record review and patient interview with PAT staff.   This patient has been appropriately cleared by cardiology with an overall LOW risk of experiencing significant perioperative cardiovascular complications. Based on clinical review performed today (12/16/22), barring any significant acute changes in the patient's overall condition, it is anticipated that she will be able to proceed with the planned surgical intervention. Any acute changes in clinical condition may necessitate her procedure being postponed and/or cancelled. Patient will meet with anesthesia team (MD and/or CRNA) on the day of her procedure for preoperative evaluation/assessment. Questions regarding anesthetic course will be fielded at that time.   Pre-surgical instructions were  reviewed with the patient during her PAT appointment, and questions were fielded to satisfaction by PAT clinical staff. She has been instructed on which medications that she will need to hold prior to surgery, as well as the ones that have been  deemed safe/appropriate to take on the day of her procedure. As part of the general education provided by PAT, patient made aware both verbally and in writing, that she would need to abstain from the use of any illegal substances during her perioperative course.  She was advised that failure to follow the provided instructions could necessitate case cancellation or result in serious perioperative complications up to and including death. Patient encouraged to contact PAT and/or her surgeon's office to discuss any questions or concerns that may arise prior to surgery; verbalized understanding.   Quentin Mulling, MSN, APRN, FNP-C, CEN [facility]  Peri-operative Services Nurse Practitioner Phone: [phone] Fax: [phone] 12/16/22 3:02 PM  NOTE: This note has been prepared using Dragon dictation software. Despite my best ability to proofread, there is always the potential that unintentional transcriptional errors may still occur from this process.

## 2022-12-16 ENCOUNTER — Encounter: Payer: Self-pay | Admitting: Orthopedic Surgery

## 2022-12-19 MED ORDER — FAMOTIDINE 20 MG PO TABS
20.0000 mg | ORAL_TABLET | Freq: Once | ORAL | Status: AC
  Administered 2022-12-20: 20 mg via ORAL

## 2022-12-19 MED ORDER — CHLORHEXIDINE GLUCONATE 0.12 % MT SOLN
15.0000 mL | Freq: Once | OROMUCOSAL | Status: AC
  Administered 2022-12-20: 15 mL via OROMUCOSAL

## 2022-12-19 MED ORDER — TRANEXAMIC ACID-NACL 1000-0.7 MG/100ML-% IV SOLN
1000.0000 mg | INTRAVENOUS | Status: AC
  Administered 2022-12-20 (×2): 1000 mg via INTRAVENOUS

## 2022-12-19 MED ORDER — LACTATED RINGERS IV SOLN: INTRAVENOUS | Status: DC

## 2022-12-19 MED ORDER — ORAL CARE MOUTH RINSE: 15.0000 mL | Freq: Once | OROMUCOSAL | Status: AC

## 2022-12-19 MED ORDER — DEXAMETHASONE SODIUM PHOSPHATE 10 MG/ML IJ SOLN
8.0000 mg | Freq: Once | INTRAMUSCULAR | Status: AC
  Administered 2022-12-20: 8 mg via INTRAVENOUS

## 2022-12-19 MED ORDER — CEFAZOLIN SODIUM-DEXTROSE 2-4 GM/100ML-% IV SOLN
2.0000 g | INTRAVENOUS | Status: AC
  Administered 2022-12-20: 2 g via INTRAVENOUS

## 2022-12-20 ENCOUNTER — Ambulatory Visit: Payer: Medicare Other | Admitting: Urgent Care

## 2022-12-20 ENCOUNTER — Observation Stay
Admission: RE | Admit: 2022-12-20 | Discharge: 2022-12-21 | Disposition: A | Discharge status: Home Health | Payer: Medicare Other | Attending: Orthopedic Surgery | Admitting: Orthopedic Surgery

## 2022-12-20 ENCOUNTER — Ambulatory Visit: Payer: Medicare Other

## 2022-12-20 ENCOUNTER — Other Ambulatory Visit: Payer: Self-pay

## 2022-12-20 ENCOUNTER — Encounter: Payer: Self-pay | Admitting: Orthopedic Surgery

## 2022-12-20 ENCOUNTER — Encounter
Admission: RE | Disposition: A | Discharge status: Home Health | Payer: Self-pay | Source: Home / Self Care | Attending: Orthopedic Surgery

## 2022-12-20 DIAGNOSIS — Z79899 Other long term (current) drug therapy: Secondary | ICD-10-CM | POA: Insufficient documentation

## 2022-12-20 DIAGNOSIS — I10 Essential (primary) hypertension: Secondary | ICD-10-CM | POA: Insufficient documentation

## 2022-12-20 DIAGNOSIS — M1711 Unilateral primary osteoarthritis, right knee: Secondary | ICD-10-CM | POA: Diagnosis not present

## 2022-12-20 DIAGNOSIS — Z7982 Long term (current) use of aspirin: Secondary | ICD-10-CM | POA: Insufficient documentation

## 2022-12-20 DIAGNOSIS — Z01812 Encounter for preprocedural laboratory examination: Principal | ICD-10-CM

## 2022-12-20 DIAGNOSIS — Z96651 Presence of right artificial knee joint: Secondary | ICD-10-CM

## 2022-12-20 DIAGNOSIS — I2510 Atherosclerotic heart disease of native coronary artery without angina pectoris: Secondary | ICD-10-CM | POA: Insufficient documentation

## 2022-12-20 DIAGNOSIS — E039 Hypothyroidism, unspecified: Secondary | ICD-10-CM | POA: Diagnosis not present

## 2022-12-20 DIAGNOSIS — G20C Parkinsonism, unspecified: Secondary | ICD-10-CM | POA: Diagnosis not present

## 2022-12-20 DIAGNOSIS — Z96652 Presence of left artificial knee joint: Secondary | ICD-10-CM | POA: Diagnosis not present

## 2022-12-20 HISTORY — PX: TOTAL KNEE ARTHROPLASTY: SHX125

## 2022-12-20 HISTORY — DX: Hyperlipidemia, unspecified: E78.5

## 2022-12-20 HISTORY — DX: Mild cognitive impairment of uncertain or unknown etiology: G31.84

## 2022-12-20 HISTORY — DX: Dry eye syndrome of unspecified lacrimal gland: H04.129

## 2022-12-20 HISTORY — DX: Hypothyroidism, unspecified: E03.9

## 2022-12-20 HISTORY — DX: Other specified disorders of bone density and structure, unspecified site: M85.80

## 2022-12-20 HISTORY — DX: Iron deficiency: E61.1

## 2022-12-20 HISTORY — DX: Wedge compression fracture of unspecified lumbar vertebra, initial encounter for closed fracture: S32.000A

## 2022-12-20 SURGERY — ARTHROPLASTY, KNEE, TOTAL
Anesthesia: Spinal | Site: Knee | Laterality: Right

## 2022-12-20 MED ORDER — CEFAZOLIN SODIUM-DEXTROSE 2-4 GM/100ML-% IV SOLN
INTRAVENOUS | Status: AC
  Filled 2022-12-20: qty 100

## 2022-12-20 MED ORDER — PAROXETINE HCL 10 MG PO TABS
10.0000 mg | ORAL_TABLET | Freq: Every day | ORAL | Status: DC
  Administered 2022-12-20: 10 mg via ORAL
  Filled 2022-12-20: qty 1

## 2022-12-20 MED ORDER — TRANEXAMIC ACID-NACL 1000-0.7 MG/100ML-% IV SOLN
INTRAVENOUS | Status: AC
  Filled 2022-12-20: qty 100

## 2022-12-20 MED ORDER — SODIUM CHLORIDE 0.9 % IV SOLN: INTRAVENOUS | Status: DC

## 2022-12-20 MED ORDER — CEFAZOLIN SODIUM-DEXTROSE 2-4 GM/100ML-% IV SOLN
2.0000 g | Freq: Four times a day (QID) | INTRAVENOUS | Status: AC
  Administered 2022-12-20 – 2022-12-21 (×2): 2 g via INTRAVENOUS
  Filled 2022-12-20 (×2): qty 100

## 2022-12-20 MED ORDER — DEXAMETHASONE SODIUM PHOSPHATE 10 MG/ML IJ SOLN
INTRAMUSCULAR | Status: AC
  Filled 2022-12-20: qty 1

## 2022-12-20 MED ORDER — CYCLOSPORINE 0.05 % OP EMUL
1.0000 [drp] | Freq: Two times a day (BID) | OPHTHALMIC | Status: DC
  Administered 2022-12-20 – 2022-12-21 (×2): 1 [drp] via OPHTHALMIC
  Filled 2022-12-20 (×2): qty 30

## 2022-12-20 MED ORDER — PROPOFOL 1000 MG/100ML IV EMUL
INTRAVENOUS | Status: AC
  Filled 2022-12-20: qty 100

## 2022-12-20 MED ORDER — HYDRALAZINE HCL 20 MG/ML IJ SOLN
10.0000 mg | Freq: Once | INTRAMUSCULAR | Status: AC
  Administered 2022-12-20: 10 mg via INTRAVENOUS

## 2022-12-20 MED ORDER — MENTHOL 3 MG MT LOZG: 1.0000 | LOZENGE | OROMUCOSAL | Status: DC | PRN

## 2022-12-20 MED ORDER — CHLORHEXIDINE GLUCONATE 0.12 % MT SOLN
OROMUCOSAL | Status: AC
  Filled 2022-12-20: qty 15

## 2022-12-20 MED ORDER — METOCLOPRAMIDE HCL 5 MG/ML IJ SOLN: 5.0000 mg | Freq: Three times a day (TID) | INTRAMUSCULAR | Status: DC | PRN

## 2022-12-20 MED ORDER — PROPOFOL 500 MG/50ML IV EMUL
INTRAVENOUS | Status: DC | PRN
  Administered 2022-12-20: 100 ug/kg/min via INTRAVENOUS

## 2022-12-20 MED ORDER — MIDODRINE HCL 5 MG PO TABS: 2.5000 mg | ORAL_TABLET | Freq: Three times a day (TID) | ORAL | Status: DC | PRN

## 2022-12-20 MED ORDER — SODIUM CHLORIDE 0.9 % IR SOLN
Status: DC | PRN
  Administered 2022-12-20: 500 mL

## 2022-12-20 MED ORDER — FAMOTIDINE 20 MG PO TABS
ORAL_TABLET | ORAL | Status: AC
  Filled 2022-12-20: qty 1

## 2022-12-20 MED ORDER — LORATADINE 10 MG PO TABS
10.0000 mg | ORAL_TABLET | Freq: Every day | ORAL | Status: DC
  Filled 2022-12-20: qty 1

## 2022-12-20 MED ORDER — PANTOPRAZOLE SODIUM 40 MG PO TBEC
40.0000 mg | DELAYED_RELEASE_TABLET | Freq: Every day | ORAL | Status: DC
  Administered 2022-12-20 – 2022-12-21 (×2): 40 mg via ORAL
  Filled 2022-12-20 (×2): qty 1

## 2022-12-20 MED ORDER — METOCLOPRAMIDE HCL 5 MG PO TABS: 5.0000 mg | ORAL_TABLET | Freq: Three times a day (TID) | ORAL | Status: DC | PRN

## 2022-12-20 MED ORDER — BUPIVACAINE HCL (PF) 0.5 % IJ SOLN
INTRAMUSCULAR | Status: DC | PRN
  Administered 2022-12-20: 2.6 mL

## 2022-12-20 MED ORDER — BUPIVACAINE LIPOSOME 1.3 % IJ SUSP
INTRAMUSCULAR | Status: AC
  Filled 2022-12-20: qty 20

## 2022-12-20 MED ORDER — SURGIPHOR WOUND IRRIGATION SYSTEM - OPTIME
TOPICAL | Status: DC | PRN
  Administered 2022-12-20: 450 mL

## 2022-12-20 MED ORDER — PHENYLEPHRINE HCL-NACL 20-0.9 MG/250ML-% IV SOLN
INTRAVENOUS | Status: AC
  Filled 2022-12-20: qty 250

## 2022-12-20 MED ORDER — CALCIUM-MAGNESIUM-ZINC 333-133-5 MG PO TABS: 1.0000 | ORAL_TABLET | Freq: Every day | ORAL | Status: DC

## 2022-12-20 MED ORDER — FENTANYL CITRATE (PF) 100 MCG/2ML IJ SOLN
INTRAMUSCULAR | Status: DC | PRN
  Administered 2022-12-20: 25 ug via INTRAVENOUS
  Administered 2022-12-20: 50 ug via INTRAVENOUS
  Administered 2022-12-20: 25 ug via INTRAVENOUS

## 2022-12-20 MED ORDER — ACETAMINOPHEN 10 MG/ML IV SOLN
INTRAVENOUS | Status: DC | PRN
  Administered 2022-12-20: 1000 mg via INTRAVENOUS

## 2022-12-20 MED ORDER — DOCUSATE SODIUM 100 MG PO CAPS
100.0000 mg | ORAL_CAPSULE | Freq: Two times a day (BID) | ORAL | Status: DC
  Administered 2022-12-20 – 2022-12-21 (×2): 100 mg via ORAL
  Filled 2022-12-20 (×3): qty 1

## 2022-12-20 MED ORDER — HYDROCODONE-ACETAMINOPHEN 5-325 MG PO TABS: 1.0000 | ORAL_TABLET | ORAL | Status: DC | PRN

## 2022-12-20 MED ORDER — ENOXAPARIN SODIUM 30 MG/0.3ML IJ SOSY
30.0000 mg | PREFILLED_SYRINGE | Freq: Two times a day (BID) | INTRAMUSCULAR | Status: DC
  Administered 2022-12-21: 30 mg via SUBCUTANEOUS
  Filled 2022-12-20: qty 0.3

## 2022-12-20 MED ORDER — ACETAMINOPHEN 10 MG/ML IV SOLN
INTRAVENOUS | Status: AC
  Filled 2022-12-20: qty 100

## 2022-12-20 MED ORDER — LEVOTHYROXINE SODIUM 50 MCG PO TABS
50.0000 ug | ORAL_TABLET | Freq: Every day | ORAL | Status: DC
  Administered 2022-12-21: 50 ug via ORAL
  Filled 2022-12-20: qty 1

## 2022-12-20 MED ORDER — PHENOL 1.4 % MT LIQD: 1.0000 | OROMUCOSAL | Status: DC | PRN

## 2022-12-20 MED ORDER — ACETAMINOPHEN 500 MG PO TABS
1000.0000 mg | ORAL_TABLET | Freq: Three times a day (TID) | ORAL | Status: AC
  Administered 2022-12-20 – 2022-12-21 (×4): 1000 mg via ORAL
  Filled 2022-12-20 (×4): qty 2

## 2022-12-20 MED ORDER — BUPIVACAINE-EPINEPHRINE (PF) 0.25% -1:200000 IJ SOLN
INTRAMUSCULAR | Status: AC
  Filled 2022-12-20: qty 30

## 2022-12-20 MED ORDER — SODIUM CHLORIDE (PF) 0.9 % IJ SOLN
INTRAMUSCULAR | Status: DC | PRN
  Administered 2022-12-20: 71 mL via INTRAMUSCULAR

## 2022-12-20 MED ORDER — ONDANSETRON HCL 4 MG/2ML IJ SOLN: 4.0000 mg | Freq: Four times a day (QID) | INTRAMUSCULAR | Status: DC | PRN

## 2022-12-20 MED ORDER — CARBIDOPA-LEVODOPA 25-100 MG PO TABS
1.0000 | ORAL_TABLET | Freq: Four times a day (QID) | ORAL | Status: DC
  Administered 2022-12-20 – 2022-12-21 (×3): 1 via ORAL
  Filled 2022-12-20 (×3): qty 1

## 2022-12-20 MED ORDER — PHENYLEPHRINE HCL-NACL 20-0.9 MG/250ML-% IV SOLN
INTRAVENOUS | Status: DC | PRN
  Administered 2022-12-20: 25 ug/min via INTRAVENOUS

## 2022-12-20 MED ORDER — BUPIVACAINE HCL (PF) 0.5 % IJ SOLN
INTRAMUSCULAR | Status: AC
  Filled 2022-12-20: qty 10

## 2022-12-20 MED ORDER — SODIUM CHLORIDE FLUSH 0.9 % IV SOLN
INTRAVENOUS | Status: AC
  Filled 2022-12-20: qty 20

## 2022-12-20 MED ORDER — TRAMADOL HCL 50 MG PO TABS: 50.0000 mg | ORAL_TABLET | Freq: Four times a day (QID) | ORAL | Status: DC | PRN

## 2022-12-20 MED ORDER — KETOROLAC TROMETHAMINE 15 MG/ML IJ SOLN
7.5000 mg | Freq: Four times a day (QID) | INTRAMUSCULAR | Status: DC
  Administered 2022-12-20 – 2022-12-21 (×3): 7.5 mg via INTRAVENOUS
  Filled 2022-12-20 (×3): qty 1

## 2022-12-20 MED ORDER — FENTANYL CITRATE (PF) 100 MCG/2ML IJ SOLN
INTRAMUSCULAR | Status: AC
  Filled 2022-12-20: qty 2

## 2022-12-20 MED ORDER — HYDRALAZINE HCL 20 MG/ML IJ SOLN
INTRAMUSCULAR | Status: AC
  Filled 2022-12-20: qty 1

## 2022-12-20 MED ORDER — MORPHINE SULFATE (PF) 2 MG/ML IV SOLN: 0.5000 mg | INTRAVENOUS | Status: DC | PRN

## 2022-12-20 MED ORDER — METOPROLOL SUCCINATE ER 25 MG PO TB24
12.5000 mg | ORAL_TABLET | Freq: Every day | ORAL | Status: DC
  Filled 2022-12-20: qty 1

## 2022-12-20 MED ORDER — APOAEQUORIN 10 MG PO CAPS: 1.0000 | ORAL_CAPSULE | Freq: Every day | ORAL | Status: DC

## 2022-12-20 MED ORDER — ONDANSETRON HCL 4 MG PO TABS: 4.0000 mg | ORAL_TABLET | Freq: Four times a day (QID) | ORAL | Status: DC | PRN

## 2022-12-20 MED ORDER — ONDANSETRON HCL 4 MG/2ML IJ SOLN
INTRAMUSCULAR | Status: DC | PRN
  Administered 2022-12-20: 4 mg via INTRAVENOUS

## 2022-12-20 SURGICAL SUPPLY — 82 items
ADH SKN CLS APL DERMABOND .7 (GAUZE/BANDAGES/DRESSINGS) ×1
APL PRP STRL LF DISP 70% ISPRP (MISCELLANEOUS) ×2
BLADE PATELLA REAM PILOT HOLE (MISCELLANEOUS) IMPLANT
BLADE SAGITTAL AGGR TOOTH XLG (BLADE) IMPLANT
BLADE SAW SAG 25X90X1.19 (BLADE) ×1 IMPLANT
BLADE SAW SAG 29X58X.64 (BLADE) ×1 IMPLANT
BNDG CMPR 6 X 5 YARDS HK CLSR (GAUZE/BANDAGES/DRESSINGS) ×1
BNDG ELASTIC 6INX 5YD STR LF (GAUZE/BANDAGES/DRESSINGS) IMPLANT
BOWL CEMENT MIX W/ADAPTER (MISCELLANEOUS) ×1 IMPLANT
BSPLAT TIB 5D E CMNT KN RT (Knees) ×1 IMPLANT
CEMENT BONE R 1X40 (Cement) ×2 IMPLANT
CHLORAPREP W/TINT 26 (MISCELLANEOUS) ×2 IMPLANT
COMP FEM CMT PERSONA SZ7 RT (Joint) ×1 IMPLANT
COMPONENT FEM CMT PRSONA SZ7RT (Joint) IMPLANT
COOLER POLAR GLACIER W/PUMP (MISCELLANEOUS) ×1 IMPLANT
CUFF TOURN SGL QUICK 24 (TOURNIQUET CUFF) ×1
CUFF TOURN SGL QUICK 30 (TOURNIQUET CUFF)
CUFF TRNQT CYL 24X4X16.5-23 (TOURNIQUET CUFF) IMPLANT
CUFF TRNQT CYL 30X4X21-28X (TOURNIQUET CUFF) IMPLANT
DERMABOND ADVANCED .7 DNX12 (GAUZE/BANDAGES/DRESSINGS) ×1 IMPLANT
DRAPE 3/4 80X56 (DRAPES) ×1 IMPLANT
DRAPE INCISE IOBAN 66X60 STRL (DRAPES) IMPLANT
DRSG MEPILEX SACRM 8.7X9.8 (GAUZE/BANDAGES/DRESSINGS) ×1 IMPLANT
DRSG OPSITE POSTOP 4X10 (GAUZE/BANDAGES/DRESSINGS) IMPLANT
DRSG OPSITE POSTOP 4X8 (GAUZE/BANDAGES/DRESSINGS) IMPLANT
ELECT REM PT RETURN 9FT ADLT (ELECTROSURGICAL) ×1
ELECTRODE REM PT RTRN 9FT ADLT (ELECTROSURGICAL) ×1 IMPLANT
GLOVE BIO SURGEON STRL SZ8 (GLOVE) ×1 IMPLANT
GLOVE BIOGEL PI IND STRL 8 (GLOVE) ×1 IMPLANT
GLOVE PI ORTHO PRO STRL 7.5 (GLOVE) ×2 IMPLANT
GLOVE PI ORTHO PRO STRL SZ8 (GLOVE) ×2 IMPLANT
GLOVE SURG SYN 7.5 E (GLOVE) ×1 IMPLANT
GLOVE SURG SYN 7.5 PF PI (GLOVE) ×1 IMPLANT
GOWN SRG XL LVL 3 NONREINFORCE (GOWNS) ×1 IMPLANT
GOWN STRL NON-REIN TWL XL LVL3 (GOWNS) ×1
GOWN STRL REUS W/ TWL LRG LVL3 (GOWN DISPOSABLE) ×1 IMPLANT
GOWN STRL REUS W/ TWL XL LVL3 (GOWN DISPOSABLE) ×1 IMPLANT
GOWN STRL REUS W/TWL LRG LVL3 (GOWN DISPOSABLE) ×1
GOWN STRL REUS W/TWL XL LVL3 (GOWN DISPOSABLE) ×1
HANDLE YANKAUER SUCT OPEN TIP (MISCELLANEOUS) ×1 IMPLANT
HOOD PEEL AWAY T7 (MISCELLANEOUS) ×2 IMPLANT
INSERT TIB ASF SZ 6-7/EF 10 RT (Insert) IMPLANT
IV NS IRRIG 3000ML ARTHROMATIC (IV SOLUTION) ×1 IMPLANT
KIT TURNOVER KIT A (KITS) ×1 IMPLANT
MANIFOLD NEPTUNE II (INSTRUMENTS) ×1 IMPLANT
MARKER SKIN DUAL TIP RULER LAB (MISCELLANEOUS) ×1 IMPLANT
MAT ABSORB FLUID 56X50 GRAY (MISCELLANEOUS) ×1 IMPLANT
NDL FILTER BLUNT 18X1 1/2 (NEEDLE) ×1 IMPLANT
NDL HYPO 21X1.5 SAFETY (NEEDLE) ×1 IMPLANT
NDL SAFETY ECLIP 18X1.5 (MISCELLANEOUS) ×1 IMPLANT
NEEDLE FILTER BLUNT 18X1 1/2 (NEEDLE) ×1 IMPLANT
NEEDLE HYPO 21X1.5 SAFETY (NEEDLE) ×1 IMPLANT
NS IRRIG 500ML POUR BTL (IV SOLUTION) IMPLANT
PACK TOTAL KNEE (MISCELLANEOUS) ×1 IMPLANT
PAD ARMBOARD 7.5X6 YLW CONV (MISCELLANEOUS) ×3 IMPLANT
PAD WRAPON POLAR KNEE (MISCELLANEOUS) ×1 IMPLANT
PENCIL SMOKE EVACUATOR (MISCELLANEOUS) ×1 IMPLANT
PIN DRILL HDLS TROCAR 75 4PK (PIN) IMPLANT
PULSAVAC PLUS IRRIG FAN TIP (DISPOSABLE) ×1
SCREW FEMALE HEX FIX 25X2.5 (ORTHOPEDIC DISPOSABLE SUPPLIES) IMPLANT
SCREW HEX HEADED 3.5X27 DISP (ORTHOPEDIC DISPOSABLE SUPPLIES) IMPLANT
SLEEVE SCD COMPRESS KNEE MED (STOCKING) ×1 IMPLANT
SOLUTION IRRIG SURGIPHOR (IV SOLUTION) ×1 IMPLANT
STEM POLY PAT PLY 32M KNEE (Knees) IMPLANT
STEM TIB ST PERS 14+30 (Stem) IMPLANT
STEM TIBIA 5 DEG SZ E R KNEE (Knees) IMPLANT
STOCKINETTE BIAS CUT 6 980064 (GAUZE/BANDAGES/DRESSINGS) IMPLANT
SUT DVC 2 QUILL PDO T11 36X36 (SUTURE) ×1 IMPLANT
SUT QUILL MONODERM 3-0 PS-2 (SUTURE) ×1 IMPLANT
SUT VIC AB 0 CT1 36 (SUTURE) ×1 IMPLANT
SUT VIC AB 2-0 CT2 27 (SUTURE) ×2 IMPLANT
SUT VICRYL 1-0 27IN ABS (SUTURE) ×1
SUTURE VICRYL 1-0 27IN ABS (SUTURE) ×1 IMPLANT
SYR 30ML LL (SYRINGE) ×2 IMPLANT
SYR TB 1ML LL NO SAFETY (SYRINGE) ×1 IMPLANT
TAPE CLOTH 3X10 WHT NS LF (GAUZE/BANDAGES/DRESSINGS) ×1 IMPLANT
TIBIA STEM 5 DEG SZ E R KNEE (Knees) ×1 IMPLANT
TIP FAN IRRIG PULSAVAC PLUS (DISPOSABLE) ×1 IMPLANT
TOWEL OR 17X26 4PK STRL BLUE (TOWEL DISPOSABLE) IMPLANT
TRAP FLUID SMOKE EVACUATOR (MISCELLANEOUS) ×1 IMPLANT
WATER STERILE IRR 1000ML POUR (IV SOLUTION) ×1 IMPLANT
WRAPON POLAR PAD KNEE (MISCELLANEOUS) ×1

## 2022-12-20 NOTE — H&P (Signed)
History of Present Illness: The patient is an 88 y.o. female seen in clinic today for evaluation of her right knee. Patient reports she has had pain and instability in her right knee for years which is slowly worsened and become constant. She reports her knee feels like it is going to give out on her and does give out on her at times causing her to nearly fall. She reports the pain gets up to a 7 or 8 out of 10 and limits her ability to walk. She has tried injections, therapy, and bracing without improvement of pain or function of her right knee. At this time she is functionally limited by her right knee and is interested in a more permanent solution for her knee. She underwent a left total knee replacement in 2013 and did well with that surgery without any issues. Patient has remote history of heart attack and is followed by cardiology with no ongoing issues. Patient does have a history of GI bleed earlier this year which was embolized but she did well with that has not had any recurrent issues from it. The patient denies fevers, chills, numbness, tingling, shortness of breath, chest pain, recent illness, or any trauma.  Patient is a non-smoker, nondiabetic with a BMI of 21.8  Past Medical History: Past Medical History:  Diagnosis Date  Depression  History of cancer  Hypertension  Iron deficiency  Lens replaced by other means  Myocardial infarction (CMS/HHS-HCC)  Open angle with borderline findings, low risk  Osteoarthritis  Osteopenia  Polymyalgia rheumatica (CMS/HHS-HCC)  Supraventricular tachycardia  Thyroid disease   Past Surgical History: Past Surgical History:  Procedure Laterality Date  JOINT REPLACEMENT Left 01/10/2012  L TKA - PE after  EGD N/A 08/21/2012  Procedure: ESOPHAGOGASTRODUODENOSCOPY, FLEXIBLE, TRANSORAL; DIAGNOSTIC, INCLUDING COLLECTION OF SPECIMEN(S) BY BRUSHING OR WASHING, WHEN PERFORMED (SEPARATE PROCEDURE); Surgeon: Cora Collum, MD; Location: [facility]  ENDO/BRONCH; Service: Gastroenterology; Laterality: N/A;  COLONOSCOPY 09/14/2022  Hematochezia/RptPRN/SMR  ANKLE SURGERY  BENIGN MASS REMOVED FROM BILATERAL BREASTS 1970 AND 1975  BREAST LUMPECTOMY  CATARACT EXTRACTION  EPIRETINAL MEMBRANE REMOVED FROM LEFT EYE  EPIRETINAL MEMBRANED REMOVED FROM RIGHT EYE  HYSTERECTOMY  KYPHOPLASTY  L1 and L2  LENS EYE SURGERY Bilateral  pciol  OOPHORECTOMY  TEMPORAL ATERY BIOPSY  TIBIAL TENDON REPAIR LEFT LEG WITH SCREENS IN FRONT   Past Family History: Family History  Problem Relation Age of Onset  High blood pressure (Hypertension) Mother  Macular degeneration Daughter  druids  Hyperlipidemia (Elevated cholesterol) Daughter  Glaucoma Son  High blood pressure (Hypertension) Son  High blood pressure (Hypertension) Daughter  Rheum arthritis Daughter  Fibromyalgia Daughter  Chronic fatigue Daughter  High blood pressure (Hypertension) Maternal Grandmother  High blood pressure (Hypertension) Paternal Grandfather  Blindness Neg Hx  Retinal degeneration Neg Hx   Medications: Current Outpatient Medications  Medication Sig Dispense Refill  acetaminophen (TYLENOL) 500 MG tablet Take 1,000 mg by mouth every 6 (six) hours as needed.  aspirin 81 MG EC tablet Take 81 mg by mouth once daily  calcium carbonate (CALTRATE 600) 600 mg (1,500 mg) Tab  carbidopa-levodopa (SINEMET) 25-100 mg tablet Take 1 tablet by mouth 4 (four) times daily Taking around 8a, 12p, 4p, and 8p. 360 tablet 1  cetirizine (ZYRTEC) 10 mg capsule Take 10 mg by mouth daily.  cholecalciferol (VITAMIN D3) 2,000 unit capsule Take 2,000 Units by mouth once daily.  clindamycin (CLEOCIN) 150 MG capsule  docusate (COLACE) 100 MG capsule Take 100 mg by mouth once daily as needed  fluticasone propionate (FLONASE) 50 mcg/actuation nasal spray SPRAY 2 SPRAYS INTO EACH NOSTRIL EVERY DAY 16 g 1  Lactobacillus acidophilus (PROBIOTIC ORAL) Take by mouth  levothyroxine (SYNTHROID) 50 MCG tablet  TAKE 1 TABLET (50 MCG TOTAL) BY MOUTH ONCE DAILY TAKE ON AN EMPTY STOMACH WITH A GLASS OF WATER AT LEAST 30-60 MINUTES BEFORE BREAKFAST. 90 tablet 1  meloxicam (MOBIC) 15 MG tablet TAKE 1 TABLET BY MOUTH EVERY DAY 90 tablet 1  metoprolol succinate (TOPROL-XL) 25 MG XL tablet TAKE 1/2 TABLET BY MOUTH ONCE DAILY 45 tablet 1  multivitamin-minerals-lutein (CENTRUM SILVER) Tab ONCE DAILY  PARoxetine (PAXIL) 10 MG tablet TAKE 1 TABLET BY MOUTH EVERY DAY 90 tablet 3  PREMARIN 0.625 mg/gram vaginal cream APPLY PEA-SIZED AMOUNT TO EXTERNAL VAGINAL TISSUES 2-3 TIMES WEEKLY. 30 g 1  propylene glycol (SYSTANE BALANCE) 0.6 % ophthalmic drops Place 1 drop into both eyes 2 (two) times daily as needed for Dry Eyes 10 mL 2  RESTASIS 0.05 % ophthalmic emulsion  tobramycin (TOBREX) 0.3 % ophthalmic solution  carbidopa-levodopa (SINEMET) 25-100 mg tablet Take 1 tablet by mouth 4 (four) times daily for 55 days 220 tablet 0  midodrine (PROAMATINE) 2.5 MG tablet Take 1 tablet (2.5 mg total) by mouth 3 (three) times daily 90 tablet 11   No current facility-administered medications for this visit.   Allergies: Allergies  Allergen Reactions  Statins-Hmg-Coa Reductase Inhibitors Unknown and Other (See Comments)  Unknown reaction  Aloe Rash  Bactrim [Sulfamethoxazole-Trimethoprim] Nausea  Cardizem [Diltiazem Hcl] Hives  Macrobid [Nitrofurantoin Monohyd/M-Cryst] Rash  08-11-12 per pt caused Esophagitis and Urticaria  Penicillins Rash  Doxycycline Other (See Comments)  RASH - HAND ITCHING    Visit Vitals: There were no vitals filed for this visit.   Review of Systems:  A comprehensive 14 point ROS was performed, reviewed, and the pertinent orthopaedic findings are documented in the HPI.  Physical Exam: General/Constitutional: No apparent distress: well-nourished and well developed. Eyes: Pupils equal, round with synchronous movement. Respiratory: "Non-labored breathing. Cardiac: Heart rate is  regular. Integumentary: No impressive skin lesions present, except as noted in detailed exam. Neuro/Psych: Normal mood and affect, oriented to person, place and time.  Comprehensive Knee Exam: Gait Antalgic nonfluid on right  Alignment Valgus thrust on the right   Inspection Right  Skin Normal appearance with no obvious deformity. No ecchymosis or erythema.  Soft Tissue No focal soft tissue swelling  Quad Atrophy None   Palpation  Right  Tenderness Lateral joint line tenderness to palpation  Crepitus + patellofemoral and tibiofemoral crepitus  Effusion None   Range of Motion Right  Flexion 0-120  Extension Full knee extension without hyperextension   Ligamentous Exam Right  Lachman 2+ with sloppy endpoint  Valgus 0 1+ with endpoint  Valgus 30 Normal  Varus 0 Partially correctable valgus deformity with endpoint  Varus 30 Normal  Anterior Drawer 2+ with sloppy endpoint  Posterior Drawer Normal   Meniscal Exam Right  Hyperflexion Test Positive  Hyperextension Test Positive  McMurray's Negative   Neurovascular Right  Quadriceps Strength 5/5  Hamstring Strength 5/5  Hip Abductor Strength 4/5  Distal Motor Normal  Distal Sensory Normal light touch sensation  Distal Pulses Normal    Imaging Studies: I have reviewed AP, lateral,sunrise, weight bearing knee X-rays (3 views) of the right knee ordered and taken today in the office show severe degenerative changes with medial and lateral joint space narrowing and osteophyte formation, subchondral cysts and sclerosis. There is valgus deformity of the  right knee measuring 10 degrees. The femur is riding very posterior on the tibia with some subluxation of the tibia anteriorly. AP, sunrise, and flexed PA of the left knee also show status post total knee arthroplasty with components in appropriate appearing position without any evidence of periprosthetic fracture or loosening.   Assessment:  ICD-10-CM  1. Primary  osteoarthritis of right knee M17.11    Plan: Tracy Long is a 88 year old female who presented with right knee knee bone on bone arthritis with severe instability with ambulation. Based upon the patient's continued symptoms and failure to respond to conservative treatment, I have recommended a right total knee replacement for this patient. A long discussion took place with the patient describing what a total joint replacement is and what the procedure would entail. A knee model, similar to the implants that will be used during the operation, was utilized to demonstrate the implants. Choices of implant manufactures were discussed and reviewed. The ability to secure the implant utilizing cement or cementless (press fit) fixation was discussed. The approach and exposure was discussed.   The hospitalization and post-operative care and rehabilitation were also discussed. The use of perioperative antibiotics and DVT prophylaxis were discussed. The risk, benefits and alternatives to a surgical intervention were discussed at length with the patient. The patient was also advised of risks related to the medical comorbidities and elevated body mass index (BMI). A lengthy discussion took place to review the most common complications including but not limited to: stiffness, loss of function, complex regional pain syndrome, deep vein thrombosis, pulmonary embolus, heart attack, stroke, infection, wound breakdown, numbness, intraoperative fracture, damage to nerves, tendon,muscles, arteries or other blood vessels, death and other possible complications from anesthesia. The patient was told that we will take steps to minimize these risks by using sterile technique, antibiotics and DVT prophylaxis when appropriate and follow the patient postoperatively in the office setting to monitor progress. The possibility of recurrent pain, no improvement in pain and actual worsening of pain were also discussed with the patient.   The  discharge plan of care focused on the patient going home following surgery. The patient was encouraged to make the necessary arrangements to have someone stay with them when they are discharged home.   The benefits of surgery were discussed with the patient including the potential for improving the patient's current clinical condition through operative intervention. Alternatives to surgical intervention including continued conservative management were also discussed in detail. All questions were answered to the satisfaction of the patient. The patient participated and agreed to the plan of care as well as the use of the recommended implants for their total knee replacement surgery. An information packet was given to the patient to review prior to surgery.   Te patient received clearance for surgery. We discussed her increased age and potential increased risk with surgery she feels that she will be able to get through the recovery without any issues and her primary physicians agree. Questions answered the patient agrees with above plan make preparations for a right total knee replacement.  Portions of this record have been created using Scientist, clinical (histocompatibility and immunogenetics). Dictation errors have been sought, but may not have been identified and corrected.  Reinaldo Berber MD

## 2022-12-20 NOTE — Interval H&P Note (Signed)
Discussed DVT prophylaxis after surgery with patient. She reported today that she did have a blood clot after her total knee in 2013 which was new information to me, however she was not placed on any post op prophylaxis after that surgery when she developed it. We discussed the use of lovenox to reduce her risk with this surgery. We discussed this does increase her potential risk and she is willing to proceed with surgery despite the risk.

## 2022-12-20 NOTE — Transfer of Care (Signed)
Immediate Anesthesia Transfer of Care Note  Patient: Tracy Long  Procedure(s) Performed: TOTAL KNEE ARTHROPLASTY (Right: Knee)  Patient Location: PACU  Anesthesia Type:General  Level of Consciousness: awake and alert   Airway & Oxygen Therapy: Patient Spontanous Breathing  Post-op Assessment: Report given to RN and Post -op Vital signs reviewed and stable  Post vital signs: stable  Last Vitals:  Vitals Value Taken Time  BP    Temp    Pulse    Resp    SpO2      Last Pain:  Vitals:   12/20/22 1108  TempSrc: Temporal  PainSc: 0-No pain         Complications: No notable events documented.

## 2022-12-20 NOTE — Op Note (Signed)
Patient Name: Tracy Long  WJX:[id number]  Pre-Operative Diagnosis: Right knee Osteoarthritis  Post-Operative Diagnosis: (same)  Procedure: Right Total Knee Arthroplasty  Components/Implants: Femur: Persona Size 7 CR  Tibia: Persona Size E w/ 14x43mm stem extension  Poly: 10mm MC  Patella: 32x8.107mm symmetric  Femoral Valgus Cut Angle: 5 degrees  Distal Femoral Re-cut: none  Patella Resurfacing: yes   Date of Surgery: 12/20/2022  Surgeon: Reinaldo Berber MD  Assistant: Amador Cunas PA (present and scrubbed throughout the case, critical for assistance with exposure, retraction, instrumentation, and closure)   Anesthesiologist: Joelene Millin  Anesthesia: Spinal  Tourniquet Time: 60 min  EBL: 25cc  IVF: 500cc  Complications: None   Brief history: The patient is a 88 year old female with a history of osteoarthritis of the right knee with pain limiting their range of motion and activities of daily living, which has failed multiple attempts at conservative therapy.  The risks and benefits of total knee arthroplasty as definitive surgical treatment were discussed with the patient, who opted to proceed with the operation.  After outpatient medical clearance and optimization was completed the patient was admitted to [facility] for the procedure.  All preoperative films were reviewed and an appropriate surgical plan was made prior to surgery. Preoperative range of motion was 0 to 120. The patient was identified as having a Valgus alignment.   Description of procedure: The patient was brought to the operating room where laterality was confirmed by all those present to be the right side.   Spinal anesthesia was administered and the patient received an intravenous dose of antibiotics for surgical prophylaxis and a dose of tranexamic acid.  Patient is positioned supine on the operating room table with all bony prominences well-padded.  A well-padded tourniquet was applied to  the right thigh.  The knee was then prepped and draped in usual sterile fashion with multiple layers of adhesive and nonadhesive drapes.  All of those present in the operating room participated in a surgical timeout laterality and patient were confirmed.   An Esmarch was wrapped around the extremity and the leg was elevated and the knee flexed.  The tourniquet was inflated to a pressure of 275 mmHg. The Esmarch was removed and the leg was brought down to full extension.  The patella and tibial tubercle identified and outlined using a marking pen and a midline skin incision was made with a knife carried through the subcutaneous tissue down to the extensor retinaculum.  After exposure of the extensor mechanism the medial parapatellar arthrotomy was performed with a scalpel and electrocautery extending down medial and distal to the tibial tubercle taking care to avoid incising the patellar tendon.   A standard medial release was performed over the proximal tibia.  The knee was brought into extension in order to excise the fat pad taking care not to damage the patella tendon.  The superior soft tissue was removed from the anterior surface of the distal femur to visualize for the procedure.  The knee was then brought into flexion with the patella subluxed laterally and subluxing the tibia anteriorly.  The ACL was transected and removed with electrocautery and additional soft tissue was removed from the proximal surface of the tibia to fully expose. The PCL was found to be intact and was preserved.  An extramedullary tibial cutting guide was then applied to the leg with a spring-loaded ankle clamp placed around the distal tibia just above the malleoli the angulation of the guide was adjusted to give some  posterior slope in the tibial resection with an appropriate varus/valgus alignment.  The resection guide was then pinned to the proximal tibia and the proximal tibial surface was resected with an oscillating saw.   Careful attention was paid to ensure the blade did not disrupt any of the soft tissues including any lateral or medial ligament.  Attention was then turned to the femur, with the knee slightly flexed a opening drill was used to enter the medullary canal of the femur.  After removing the drill marrow was suctioned out to decompress the distal femur.  An intramedullary femoral guide was then inserted into the drill hole and the alignment guide was seated firmly against the distal end of the medial femoral condyle.  The distal femoral cutting guide was then attached and pinned securely to the anterior surface of the femur and the intramedullary rod and alignment guide was removed.  Distal femur resection was then performed with an oscillating saw with retractors protecting medial and laterally.   The distal cutting block was then removed and the extension gap was checked with a spacer.  Extension gap was found to be appropriately sized to accommodate the spacer block.   The femoral sizing guide was then placed securely into the posterior condyles of the femur and the femoral size was measured and determined to be 7.  The size 7; 4-in-1 cutting guide was placed in position and secured with 2 pins.  The anterior posterior and chamfer resections were then performed with an oscillating saw.  Bony fragments and osteophytes were then removed.  Using a lamina spreader the posterior medial and lateral condyles were checked for additional osteophytes and posterior soft tissue remnants.  Any remaining meniscus was removed at this time.  Periarticular injection was performed in the meniscal rims and posterior capsule with aspiration performed to ensure no intravascular injection.   The tibia was then exposed and the tibial trial was pinned onto the plateau after confirming appropriate orientation and rotation.  Using the drill bushing the tibia was prepared to the appropriate drill depth.  Tibial broach impactor was then  driven through the punch guide using a mallet.  The femoral trial component was then inserted onto the femur. A trial tibial polyethylene bearing was then placed and the knee was reduced.  The knee achieved full extension with no hyperextension and was found to be balanced in flexion and extension with the trials in place.  The knee was then brought into full extension the patella was everted and held with 2 Kocher clamps.  The articular surface of the patella was then resected with an patella reamer and saw after careful measurement with a caliper.  The patella was then prepared with the drill guide and a trial patella was placed.  The knee was then taken through range of motion and it was found that the patella articulated appropriately with the trochlea and good patellofemoral motion without subluxation.    The correct final components for implantation were confirmed and opened by the circulator nurse.  The prepared surfaces of the patella femur and tibia were cleaned with pulsatile lavage to remove all blood fat and other material and then the surfaces were dried.  2 bags of cement were mixed under vacuum and the components were cemented into place.  Excess cement was removed with curettes and forceps. A trial polyethylene tibial component was placed and the knee was brought into extension to allow the cement to set.  At this time the periarticular injection  cocktail was placed in the soft tissues surrounding the knee.  After full curing of the cement the balance of the knee was checked again and the final polyethylene size was confirmed. The tibial component was irrigated and locking mechanism checked to ensure it was clear of debris. The real polyethylene tibial component was implanted and the knee was brought through a range of motion.   The knee was then irrigated with copious amount of normal saline via pulsatile lavage to remove all loose bodies and other debris.  The knee was then irrigated with  surgiphor betadine based wash and reirrigated with saline.  The tourniquet was then dropped and all bleeding vessels were identified and coagulated.  The arthrotomy was approximated with #1 Vicryl and closed with #2 Quill suture.  The knee was brought into slight flexion and the subcutaneous tissues were closed with 0 Vicryl, 2-0 Vicryl and a running subcuticular 3-0 monoderm quil suture.  Skin was then glued with Dermabond.  A sterile adhesive dressing was then placed along with a sequential compression device to the calf, a Ted stocking, and a cryotherapy cuff.   Sponge, needle, and Lap counts were all correct at the end of the case.   The patient was transferred off of the operating room table to a hospital bed, good pulses were found distally on the operative side.  The patient was transferred to the recovery room in stable condition.

## 2022-12-20 NOTE — Anesthesia Preprocedure Evaluation (Signed)
Anesthesia Evaluation  Patient identified by MRN, date of birth, ID band Patient awake    Reviewed: Allergy & Precautions, NPO status , Patient's Chart, lab work & pertinent test results  History of Anesthesia Complications Negative for: history of anesthetic complications  Airway Mallampati: III  TM Distance: >3 FB Neck ROM: full    Dental no notable dental hx.    Pulmonary neg pulmonary ROS   Pulmonary exam normal        Cardiovascular hypertension, On Medications + CAD, + Past MI and + Peripheral Vascular Disease  + dysrhythmias Supra Ventricular Tachycardia   Most recent TTE was performed on 11/03/2016 revealing a normal left ventricular systolic function with most recent TTE was performed on 11/03/2016 revealing a normal left ventricular systolic function with an EF of >55%.  There were no regional wall motion abnormalities.  Left atrium is mildly enlarged.  There was mild paravalvular regurgitation.  All transvalvular gradients were noted to be normal providing no evidence suggestive of valvular stenosis.  RVSP 33.9 mmHg.  Aorta normal in size with no evidence of aneurysmal dilatation.   Neuro/Psych  PSYCHIATRIC DISORDERS  Depression    Parkinson's negative neurological ROS     GI/Hepatic Neg liver ROS,GERD  ,,  Endo/Other  Hypothyroidism    Renal/GU      Musculoskeletal  (+) Arthritis , Osteoarthritis and Rheumatoid disorders,  Polymyalgia rheumatica     Abdominal   Peds  Hematology  (+) Blood dyscrasia, anemia Hx of PE after total knee    Anesthesia Other Findings Past Medical History: No date: Arthritis No date: Chronically dry eyes     Comment:  a.) on cyclosporine gtts No date: Compression fracture of lumbar vertebra (HCC)     Comment:  a.) s/p L1-L2 kyphoplasty No date: Coronary artery disease involving native coronary artery of  native heart without angina pectoris     Comment:  a.) s/p MI 1982; b.)  s/p type II NSTEMI secondary to               AVRT 08/31/2005: LHC 09/01/2005 - normal coronaries; c.)               MV 11/03/2016: no ischemia No date: Depression No date: Diverticulosis No date: GERD (gastroesophageal reflux disease) No date: H/O bladder infections 2003: History of bilateral cataract extraction 2006: History of right breast cancer     Comment:  a.) CNB (+) ADH with suspicion of DCIS --> s/p partial               mastectomy 05/06/2001 (DCIS) No date: HLD (hyperlipidemia) No date: Hypertension No date: Hypothyroidism No date: Inverted nipple No date: Iron deficiency 09/13/2022: Lower GI bleed     Comment:  a.) Tx'd by vascular 09/14/2022 --> s/p microbead               embolization of LEFT colonic branch of IMA and splenic               branch of SMA + coil embolization with 2 mm Ruby coils to              splenic flexure branch off of LEFT colonic branch of IMA No date: MCI (mild cognitive impairment)     Comment:  a.) takes apoequorin 1982: Myocardial infarction (HCC) No date: Osteopenia No date: Palpitations No date: Parkinson disease No date: Polymyalgia rheumatica (HCC) 2013: Postoperative pulmonary embolism (HCC)     Comment:  a.) s/p LEFT TKA No  date: PVD (peripheral vascular disease) (HCC)     Comment:  a.) s/p BILATERAL femoral stent placement in 2000 No date: Raynaud's disease No date: Rheumatoid arthritis (HCC) No date: Supraventricular tachycardia     Comment:  a.) s/p ablation for AVRT 2007; b.) rate/rhythm               currently controlled with oral metoprolol succinate 08/31/2005: Type 2 myocardial infarction [facility])     Comment:  a.) Type II NSTEMI r/t demand ischemia secondary to               SVT/AVRT; b.) LHC 09/01/2005 - normal coronaries; no sig               CAD.  Past Surgical History: 2000: BILATERAL FEMORAL STENTS; Bilateral No date: BREAST EXCISIONAL BIOPSY; Bilateral     Comment:  [facility] ([facility]) 04/2005: BREAST LUMPECTOMY;  Right 09/01/2005: CARDIAC CATHETERIZATION 2003: CATARACT EXTRACTION W/ INTRAOCULAR LENS  IMPLANT, BILATERAL;  Bilateral 09/14/2022: COLONOSCOPY WITH PROPOFOL; N/A     Comment:  Procedure: COLONOSCOPY WITH PROPOFOL;  Surgeon: Jaynie Collins, DO;  Location: [facility] ENDOSCOPY;  Service:               Gastroenterology;  Laterality: N/A; 09/14/2022: EMBOLIZATION (CATH LAB); N/A     Comment:  Procedure: EMBOLIZATION;  Surgeon: Annice Needy, MD;                Location: [facility] INVASIVE CV LAB;  Service: Cardiovascular;              Laterality: N/A; No date: EPIRETINAL MEMBRANE REMOVED; Bilateral 08/21/2012: ESOPHAGOGASTRODUODENOSCOPY No date: L1-L2 KYPHOPLASTY; N/A No date: POSTERIOR TIBIAL TENDON REPAIR; Left No date: SKIN BIOPSY 2007: SVT ABLATION; N/A No date: TEMPORAL ARTERY BIOPSY / LIGATION 1985: TOTAL ABDOMINAL HYSTERECTOMY W/ BILATERAL SALPINGOOPHORECTOMY 01/10/2012: TOTAL KNEE ARTHROPLASTY; Left No date: TUBAL LIGATION  BMI    Body Mass Index: 20.83 kg/m      Reproductive/Obstetrics negative OB ROS                              Anesthesia Physical Anesthesia Plan  ASA: 3  Anesthesia Plan: Spinal   Post-op Pain Management: Regional block* and Ofirmev IV (intra-op)*   Induction: Intravenous  PONV Risk Score and Plan: 2 and Propofol infusion and TIVA  Airway Management Planned: Natural Airway and Nasal Cannula  Additional Equipment:   Intra-op Plan:   Post-operative Plan:   Informed Consent: I have reviewed the patients History and Physical, chart, labs and discussed the procedure including the risks, benefits and alternatives for the proposed anesthesia with the patient or authorized representative who has indicated his/her understanding and acceptance.     Dental Advisory Given  Plan Discussed with: Anesthesiologist, CRNA and Surgeon  Anesthesia Plan Comments: (Patient reports no bleeding problems and no  anticoagulant use.  Plan for spinal with backup GA  Patient consented for risks of anesthesia including but not limited to:  - adverse reactions to medications - damage to eyes, teeth, lips or other oral mucosa - nerve damage due to positioning  - risk of bleeding, infection and or nerve damage from spinal that could lead to paralysis - risk of headache or failed spinal - damage to teeth, lips or other oral mucosa - sore throat or hoarseness - damage to heart, brain, nerves, lungs, other parts  of body or loss of life  Patient voiced understanding.)         Anesthesia Quick Evaluation

## 2022-12-20 NOTE — Anesthesia Procedure Notes (Signed)
Spinal  Staffing Performed by: , [place], CRNA Authorized by: Louie Boston, MD

## 2022-12-20 NOTE — Anesthesia Procedure Notes (Signed)
Spinal  Patient location during procedure: OR Start time: 12/20/2022 2:18 PM End time: 12/20/2022 2:28 PM Reason for block: surgical anesthesia Staffing Performed: resident/CRNA  Anesthesiologist: Louie Boston, MD Resident/CRNA: Maryla Morrow., CRNA Performed by: , [place], CRNA Authorized by: Louie Boston, MD   Preanesthetic Checklist Completed: patient identified, IV checked, site marked, risks and benefits discussed, surgical consent, monitors and equipment checked, pre-op evaluation and timeout performed Spinal Block Patient position: sitting Prep: Betadine Patient monitoring: heart rate, continuous pulse ox, blood pressure and cardiac monitor Approach: midline Location: L4-5 Injection technique: single-shot Needle Needle type: Whitacre and Introducer  Needle gauge: 24 G Needle length: 9 cm Assessment Events: CSF return Additional Notes Negative paresthesia. Negative blood return. Positive free-flowing CSF. Expiration date of kit checked and confirmed. Patient tolerated procedure well, without complications.

## 2022-12-20 NOTE — Interval H&P Note (Signed)
Patient history and physical updated. Consent reviewed including risks, benefits, and alternatives to surgery. Patient agrees with above plan to proceed with right total knee arthroplasty  

## 2022-12-21 DIAGNOSIS — M1711 Unilateral primary osteoarthritis, right knee: Secondary | ICD-10-CM | POA: Diagnosis not present

## 2022-12-21 MED ORDER — DOCUSATE SODIUM 100 MG PO CAPS: 100.0000 mg | ORAL_CAPSULE | Freq: Two times a day (BID) | ORAL | 0 refills | Status: AC

## 2022-12-21 MED ORDER — CELECOXIB 100 MG PO CAPS: 100.0000 mg | ORAL_CAPSULE | Freq: Every day | ORAL | 0 refills | Status: AC

## 2022-12-21 MED ORDER — ENOXAPARIN SODIUM 40 MG/0.4ML IJ SOSY
40.0000 mg | PREFILLED_SYRINGE | INTRAMUSCULAR | 0 refills | Status: DC
Start: 2022-12-21 — End: 2023-03-08

## 2022-12-21 MED ORDER — HYDROCODONE-ACETAMINOPHEN 5-325 MG PO TABS: 0.5000 | ORAL_TABLET | Freq: Four times a day (QID) | ORAL | 0 refills | Status: DC | PRN

## 2022-12-21 MED ORDER — ACETAMINOPHEN 500 MG PO TABS: 500.0000 mg | ORAL_TABLET | Freq: Four times a day (QID) | ORAL | 0 refills | Status: AC | PRN

## 2022-12-21 NOTE — TOC Progression Note (Signed)
Transition of Care [facility]) - Progression Note    Patient Details  Name: Tracy Long MRN: [id number] Date of Birth: 05/10/1935  Transition of Care [facility]) CM/SW Contact  Marlowe Sax, RN Phone Number: 12/21/2022, 10:22 AM  Clinical Narrative:    Patient reports to the DC nurse that she will need a rolling walker,  [employer] to deliver to the bedside   Expected Discharge Plan: Home w Home Health Services Barriers to Discharge: No Barriers Identified  Expected Discharge Plan and Services   Discharge Planning Services: CM Consult   Living arrangements for the past 2 months: Single Family Home Expected Discharge Date: 12/21/22               DME Arranged: N/A DME Agency: NA       HH Arranged: PT, OT HH Agency: [facility] Date HH Agency Contacted: 12/21/22 Time HH Agency Contacted: [phone] Representative spoke with at [facility] Agency: Barbara Cower   Social Determinants of Health (SDOH) Interventions SDOH Screenings   Food Insecurity: No Food Insecurity (12/20/2022)  Housing: Low Risk  (12/20/2022)  Transportation Needs: No Transportation Needs (12/20/2022)  Utilities: Not At Risk (12/20/2022)  Tobacco Use: Low Risk  (12/20/2022)  Recent Concern: Tobacco Use - Medium Risk (12/10/2022)   Received from [facility]    Readmission Risk Interventions     No data to display

## 2022-12-21 NOTE — Discharge Summary (Signed)
Physician Discharge Summary  Patient ID: Tracy Long MRN: [id number] DOB/AGE: [birth year] 88 y.o.  Admit date: 12/20/2022 Discharge date: 12/21/2022  Admission Diagnoses:  S/P TKR (total knee replacement) using cement, right [Z96.651]   Discharge Diagnoses: Patient Active Problem List   Diagnosis Date Noted   S/P TKR (total knee replacement) using cement, right 12/20/2022   Mesenteric arterial embolization (HCC) 09/15/2022   Lower GI bleed 09/15/2022   Acute blood loss anemia 09/14/2022   Rectal bleeding 09/13/2022    Past Medical History:  Diagnosis Date   Arthritis    Chronically dry eyes    a.) on cyclosporine gtts   Compression fracture of lumbar vertebra (HCC)    a.) s/p L1-L2 kyphoplasty   Coronary artery disease involving native coronary artery of native heart without angina pectoris    a.) s/p MI 1982; b.) s/p type II NSTEMI secondary to AVRT 08/31/2005: LHC 09/01/2005 - normal coronaries; c.) MV 11/03/2016: no ischemia   Depression    Diverticulosis    GERD (gastroesophageal reflux disease)    H/O bladder infections    History of bilateral cataract extraction 2003   History of right breast cancer 2006   a.) CNB (+) ADH with suspicion of DCIS --> s/p partial mastectomy 05/06/2001 (DCIS)   HLD (hyperlipidemia)    Hypertension    Hypothyroidism    Inverted nipple    Iron deficiency    Lower GI bleed 09/13/2022   a.) Tx'd by vascular 09/14/2022 --> s/p microbead embolization of LEFT colonic branch of IMA and splenic branch of SMA + coil embolization with 2 mm Ruby coils to splenic flexure branch off of LEFT colonic branch of IMA   MCI (mild cognitive impairment)    a.) takes apoequorin   Myocardial infarction (HCC) 1982   Osteopenia    Palpitations    Parkinson disease    Polymyalgia rheumatica (HCC)    Postoperative pulmonary embolism (HCC) 2013   a.) s/p LEFT TKA   PVD (peripheral vascular disease) (HCC)    a.) s/p BILATERAL femoral stent placement in  2000   Raynaud's disease    Rheumatoid arthritis (HCC)    Supraventricular tachycardia    a.) s/p ablation for AVRT 2007; b.) rate/rhythm currently controlled with oral metoprolol succinate   Type 2 myocardial infarction (HCC) 08/31/2005   a.) Type II NSTEMI r/t demand ischemia secondary to SVT/AVRT; b.) LHC 09/01/2005 - normal coronaries; no sig CAD.     Transfusion: None   Consultants (if any):   Discharged Condition: Improved  Hospital Course: Tracy Long is an 88 y.o. female who was admitted 12/20/2022 with a diagnosis of S/P TKR (total knee replacement) using cement, right and went to the operating room on 12/20/2022 and underwent the above named procedures.    Surgeries: Procedure(s): TOTAL KNEE ARTHROPLASTY on 12/20/2022 Patient tolerated the surgery well. Taken to PACU where she was stabilized and then transferred to the orthopedic floor.  Started on Lovenox 30 mg q 12 hrs. TEDs and SCDs applied bilaterally. Heels elevated on bed. No evidence of DVT. Negative Homan. Physical therapy started on day #1 for gait training and transfer. OT started day #1 for ADL and assisted devices.  Patient's IV was d/c on day #1. Patient was able to safely and independently complete all PT goals. PT recommending discharge to home.    On post op day #1 patient was stable and ready for discharge to home with home health PT.  Implants: Femur: Persona Size 7 CR  Tibia: Persona Size E w/ 14x60mm stem extension  Poly: 10mm MC  Patella: 32x8.59mm symmetric   She was given perioperative antibiotics:  Anti-infectives (From admission, onward)    Start     Dose/Rate Route Frequency Ordered Stop   12/20/22 2030  ceFAZolin (ANCEF) IVPB 2g/100 mL premix        2 g 200 mL/hr over 30 Minutes Intravenous Every 6 hours 12/20/22 1818 12/21/22 0308   12/20/22 0600  ceFAZolin (ANCEF) IVPB 2g/100 mL premix        2 g 200 mL/hr over 30 Minutes Intravenous On call to O.R. 12/19/22 2131 12/20/22 1410      .  She was given sequential compression devices, early ambulation, and Lovenox, teds for DVT prophylaxis.  She benefited maximally from the hospital stay and there were no complications.    Recent vital signs:  Vitals:   12/20/22 2305 12/21/22 0741  BP: 138/69 (!) 148/69  Pulse: 65 (!) 59  Resp: 16 14  Temp: 98.7 F (37.1 C) (!) 97.5 F (36.4 C)  SpO2: 93% 100%    Recent laboratory studies:  Lab Results  Component Value Date   HGB 12.2 12/21/2022   HGB 13.4 12/14/2022   HGB 7.8 (L) 09/17/2022   Lab Results  Component Value Date   WBC 8.4 12/21/2022   PLT 234 12/21/2022   Lab Results  Component Value Date   INR 1.2 09/14/2022   Lab Results  Component Value Date   NA 137 12/21/2022   K 3.5 12/21/2022   CL 104 12/21/2022   CO2 25 12/21/2022   BUN 24 (H) 12/21/2022   CREATININE 0.93 12/21/2022   GLUCOSE 173 (H) 12/21/2022    Discharge Medications:   Allergies as of 12/21/2022       Reactions   Aloe Hives   Bactrim [sulfamethoxazole-trimethoprim] Nausea Only   Cardizem [diltiazem] Hives   Doxycycline Hives   Macrobid [nitrofurantoin] Hives   Penicillins Hives   Statins Other (See Comments)   Unknown reaction        Medication List     STOP taking these medications    acetaminophen 325 MG suppository Commonly known as: TYLENOL Replaced by: acetaminophen 500 MG tablet   meloxicam 15 MG tablet Commonly known as: MOBIC       TAKE these medications    acetaminophen 500 MG tablet Commonly known as: TYLENOL Take 1 tablet (500 mg total) by mouth every 6 (six) hours as needed. Replaces: acetaminophen 325 MG suppository   ALIGN PREBIOTIC-PROBIOTIC PO Take 2 tablets by mouth daily.   Calcium-Magnesium-Zinc 333-133-5 MG Tabs Take 1 tablet by mouth daily.   carbidopa-levodopa 25-100 MG tablet Commonly known as: SINEMET IR Take 1 tablet by mouth 4 (four) times daily.   celecoxib 100 MG capsule Commonly known as: CeleBREX Take 1 capsule (100  mg total) by mouth daily for 7 days.   cetirizine 10 MG tablet Commonly known as: ZYRTEC Take 10 mg by mouth at bedtime.   conjugated estrogens 0.625 MG/GM vaginal cream Commonly known as: PREMARIN Place 1 g vaginally 3 (three) times a week. 1 drop on the urethral for urinary incontinence M-W-F   cycloSPORINE 0.05 % ophthalmic emulsion Commonly known as: RESTASIS Place 1 drop into both eyes 2 (two) times daily.   docusate sodium 100 MG capsule Commonly known as: COLACE Take 1 capsule (100 mg total) by mouth 2 (two) times daily.   enoxaparin 40 MG/0.4ML injection Commonly known as: LOVENOX Inject 0.4 mLs (40  mg total) into the skin daily for 14 days.   Fish Oil 1200 MG Caps Take 1 capsule by mouth daily.   fluticasone 50 MCG/ACT nasal spray Commonly known as: FLONASE Place 1 spray into both nostrils 2 (two) times daily.   HYDROcodone-acetaminophen 5-325 MG tablet Commonly known as: NORCO/VICODIN Take 0.5-1 tablets by mouth every 6 (six) hours as needed for severe pain (pain score 4-6).   levothyroxine 50 MCG tablet Commonly known as: SYNTHROID Take 50 mcg by mouth daily before breakfast.   metoprolol succinate 25 MG 24 hr tablet Commonly known as: TOPROL-XL Take 12.5 mg by mouth daily.   midodrine 2.5 MG tablet Commonly known as: PROAMATINE Take 1 tablet (2.5 mg total) by mouth 3 (three) times daily as needed (for top number for blood pressure lower than 90).   PARoxetine 10 MG tablet Commonly known as: PAXIL Take 10 mg by mouth at bedtime.   Polyethyl Glycol-Propyl Glycol 0.4-0.3 % Soln Place 1 drop into both eyes 2 (two) times daily.   polyethylene glycol powder 17 GM/SCOOP powder Commonly known as: GLYCOLAX/MIRALAX Take 8.5 g by mouth daily. 1/2 scoop   PreserVision AREDS 2 Caps Take 1 capsule by mouth 2 (two) times daily.   CENTRUM SILVER 50+WOMEN PO Take 1 tablet by mouth daily.   Prevagen 10 MG Caps Generic drug: Apoaequorin Take 1 capsule by  mouth daily.   Vitamin D 125 MCG (5000 UT) Caps Take 1 capsule by mouth daily.   Voltaren Arthritis Pain 1 % Gel Generic drug: diclofenac Sodium Apply 2 g topically daily.        Diagnostic Studies: DG Knee Right Port  Result Date: 12/20/2022 CLINICAL DATA:  Status post right total knee replacement EXAM: PORTABLE RIGHT KNEE - 2 VIEW COMPARISON:  None Available. FINDINGS: A right total knee prosthesis is in place with resurfaced patella. No periprosthetic fracture or early complicating feature is identified. Scattered gas in the soft tissues, as is normal in this circumstance. IMPRESSION: 1. Right total knee prosthesis in place without radiographic evidence of early complicating feature. Electronically Signed   By: Gaylyn Rong M.D.   On: 12/20/2022 17:18    Disposition:      Follow-up Information     Evon Slack, PA-C Follow up in 2 week(s).   Specialties: Orthopedic Surgery, Emergency Medicine Contact information: [address] [place] [address] [phone]                  Signed: Patience Musca 12/21/2022, 8:11 AM

## 2022-12-21 NOTE — Evaluation (Signed)
Physical Therapy Evaluation Patient Details Name: Tracy Long MRN: [id number] DOB: December 30, 1934 Today's Date: 12/21/2022  History of Present Illness  Pt is s/p TKR on 12/20/22.  Clinical Impression  Pt received in Semi-Fowler's position and agreeable to therapy.  Pt performed well with all transfers as noted below, however does have some safety awareness issues throughout the session.  Pt at times would talk using her hands instead of holding onto the walker while walking, which would cause the walker to move in the contralateral direction.  Pt also very fearful of falling and very verbose not allowing for cuing to be performed at times.  Pt otherwise performed well and did better with visual demonstration.  Pt is overall safe with mobility and transfers at this time.      If plan is discharge home, recommend the following: A little help with walking and/or transfers;A little help with bathing/dressing/bathroom;Assistance with cooking/housework;Assist for transportation   Can travel by private vehicle        Equipment Recommendations Rolling walker (2 wheels)  Recommendations for Other Services       Functional Status Assessment Patient has had a recent decline in their functional status and demonstrates the ability to make significant improvements in function in a reasonable and predictable amount of time.     Precautions / Restrictions Restrictions Weight Bearing Restrictions: No      Mobility  Bed Mobility Overal bed mobility: Independent                  Transfers Overall transfer level: Modified independent Equipment used: Rolling walker (2 wheels)               General transfer comment: slow but steady    Ambulation/Gait Ambulation/Gait assistance: Supervision, Min guard Gait Distance (Feet): 200 Feet Assistive device: Rolling walker (2 wheels) Gait Pattern/deviations: WFL(Within Functional Limits) Gait velocity: decreased     General Gait  Details: Pt does have poor safety awareness at times, although she is very fearful of falling.  Stairs Stairs: Yes Stairs assistance: Min guard Stair Management: One rail Right Number of Stairs: 4 General stair comments: Pt fearful, but was able to perform with CGA and proper cuing.  Wheelchair Mobility     Tilt Bed    Modified Rankin (Stroke Patients Only)       Balance Overall balance assessment: Modified Independent         Standing balance support: No upper extremity supported, During functional activity, Reliant on assistive device for balance Standing balance-Leahy Scale: Fair                               Pertinent Vitals/Pain Pain Assessment Pain Assessment: No/denies pain (No pain just sore.)    Home Living Family/patient expects to be discharged to:: Private residence Living Arrangements: Alone Available Help at Discharge: Family;Friend(s) Type of Home: House Home Access: Stairs to enter Entrance Stairs-Rails: Doctor, general practice of Steps: 2   Home Layout: One level Home Equipment: Agricultural consultant (2 wheels);Cane - single point;Grab bars - toilet      Prior Function Prior Level of Function : Independent/Modified Independent             Mobility Comments: mod-I SPC ADLs Comments: still driving     Hand Dominance   Dominant Hand: Right    Extremity/Trunk Assessment   Upper Extremity Assessment Upper Extremity Assessment: Generalized weakness    Lower Extremity Assessment Lower  Extremity Assessment: Generalized weakness       Communication   Communication: No difficulties  Cognition Arousal/Alertness: Awake/alert Behavior During Therapy: WFL for tasks assessed/performed Overall Cognitive Status: Within Functional Limits for tasks assessed                                          General Comments      Exercises     Assessment/Plan    PT Assessment Patient needs continued PT  services  PT Problem List Decreased strength;Decreased range of motion;Decreased activity tolerance;Decreased balance;Decreased mobility;Decreased knowledge of use of DME;Decreased safety awareness       PT Treatment Interventions DME instruction;Gait training;Stair training;Functional mobility training;Therapeutic activities;Therapeutic exercise;Balance training;Neuromuscular re-education    PT Goals (Current goals can be found in the Care Plan section)  Acute Rehab PT Goals Patient Stated Goal: to go home. PT Goal Formulation: With patient Time For Goal Achievement: 01/04/23 Potential to Achieve Goals: Good    Frequency BID     Co-evaluation               AM-PAC PT "6 Clicks" Mobility  Outcome Measure Help needed turning from your back to your side while in a flat bed without using bedrails?: None Help needed moving from lying on your back to sitting on the side of a flat bed without using bedrails?: None Help needed moving to and from a bed to a chair (including a wheelchair)?: A Little Help needed standing up from a chair using your arms (e.g., wheelchair or bedside chair)?: A Little Help needed to walk in hospital room?: A Little Help needed climbing 3-5 steps with a railing? : A Little 6 Click Score: 20    End of Session Equipment Utilized During Treatment: Gait belt Activity Tolerance: Patient tolerated treatment well Patient left: in bed;with call bell/phone within reach;with bed alarm set Nurse Communication: Mobility status PT Visit Diagnosis: Unsteadiness on feet (R26.81);Other abnormalities of gait and mobility (R26.89);Repeated falls (R29.6);Muscle weakness (generalized) (M62.81);Difficulty in walking, not elsewhere classified (R26.2)    Time: 1610-9604 PT Time Calculation (min) (ACUTE ONLY): 22 min   Charges:   PT Evaluation $PT Eval Low Complexity: 1 Low PT Treatments $Therapeutic Activity: 8-22 mins PT General Charges $$ ACUTE PT VISIT: 1 Visit          Nolon Bussing, PT, DPT Physical Therapist - [facility]  [facility]  12/21/22, 10:41 AM

## 2022-12-21 NOTE — Discharge Instructions (Signed)

## 2022-12-21 NOTE — Progress Notes (Signed)
Pt now has walker delivered to her room. Her and her two daughters are leaving with all of her belongings.  Denies pain. Another ortho nurse escorting the patient off the unit.

## 2022-12-21 NOTE — Progress Notes (Addendum)
Subjective: 1 Day Post-Op Procedure(s) (LRB): TOTAL KNEE ARTHROPLASTY (Right) Patient reports pain as mild.   Patient is well, and has had no acute complaints or problems Denies any CP, SOB, ABD pain. We will continue therapy today.  Plan is to go Home after hospital stay.  Objective: Vital signs in last 24 hours: Temp:  [96.9 F (36.1 C)-98.7 F (37.1 C)] 97.5 F (36.4 C) (08/06 0741) Pulse Rate:  [53-68] 59 (08/06 0741) Resp:  [10-21] 14 (08/06 0741) BP: (138-213)/(59-108) 148/69 (08/06 0741) SpO2:  [89 %-100 %] 100 % (08/06 0741) Weight:  [60.3 kg] 60.3 kg (08/05 1108)  Intake/Output from previous day: 08/05 0701 - 08/06 0700 In: 1110 [P.O.:450; I.V.:350; IV Piggyback:310] Out: 550 [Urine:550] Intake/Output this shift: No intake/output data recorded.  Recent Labs    12/21/22 0340  HGB 12.2   Recent Labs    12/21/22 0340  WBC 8.4  RBC 4.30  HCT 37.0  PLT 234   Recent Labs    12/21/22 0340  NA 137  K 3.5  CL 104  CO2 25  BUN 24*  CREATININE 0.93  GLUCOSE 173*  CALCIUM 8.7*   No results for input(s): "LABPT", "INR" in the last 72 hours.  EXAM General - Patient is Alert, Appropriate, and Oriented Extremity - Neurovascular intact Sensation intact distally Intact pulses distally Dorsiflexion/Plantar flexion intact Dressing - dressing C/D/I and no drainage Motor Function - intact, moving foot and toes well on exam.   Past Medical History:  Diagnosis Date   Arthritis    Chronically dry eyes    a.) on cyclosporine gtts   Compression fracture of lumbar vertebra (HCC)    a.) s/p L1-L2 kyphoplasty   Coronary artery disease involving native coronary artery of native heart without angina pectoris    a.) s/p MI 1982; b.) s/p type II NSTEMI secondary to AVRT 08/31/2005: LHC 09/01/2005 - normal coronaries; c.) MV 11/03/2016: no ischemia   Depression    Diverticulosis    GERD (gastroesophageal reflux disease)    H/O bladder infections    History of  bilateral cataract extraction 2003   History of right breast cancer 2006   a.) CNB (+) ADH with suspicion of DCIS --> s/p partial mastectomy 05/06/2001 (DCIS)   HLD (hyperlipidemia)    Hypertension    Hypothyroidism    Inverted nipple    Iron deficiency    Lower GI bleed 09/13/2022   a.) Tx'd by vascular 09/14/2022 --> s/p microbead embolization of LEFT colonic branch of IMA and splenic branch of SMA + coil embolization with 2 mm Ruby coils to splenic flexure branch off of LEFT colonic branch of IMA   MCI (mild cognitive impairment)    a.) takes apoequorin   Myocardial infarction (HCC) 1982   Osteopenia    Palpitations    Parkinson disease    Polymyalgia rheumatica (HCC)    Postoperative pulmonary embolism (HCC) 2013   a.) s/p LEFT TKA   PVD (peripheral vascular disease) (HCC)    a.) s/p BILATERAL femoral stent placement in 2000   Raynaud's disease    Rheumatoid arthritis (HCC)    Supraventricular tachycardia    a.) s/p ablation for AVRT 2007; b.) rate/rhythm currently controlled with oral metoprolol succinate   Type 2 myocardial infarction (HCC) 08/31/2005   a.) Type II NSTEMI r/t demand ischemia secondary to SVT/AVRT; b.) LHC 09/01/2005 - normal coronaries; no sig CAD.    Assessment/Plan:   1 Day Post-Op Procedure(s) (LRB): TOTAL KNEE ARTHROPLASTY (Right)  Principal Problem:   S/P TKR (total knee replacement) using cement, right  Estimated body mass index is 20.83 kg/m as calculated from the following:   Height as of this encounter: 5\' 7"  (1.702 m).   Weight as of this encounter: 60.3 kg. Advance diet Up with therapy Pain well-controlled Vital signs are stable Labs are stable Care management to assist with discharge to home with home health PT today pending safe completion of PT goals.   DVT Prophylaxis - Lovenox, TED hose, and SCDs Weight-Bearing as tolerated to right leg   T. Cranston Neighbor, PA-C [facility] 12/21/2022, 8:03 AM  Patient seen and  examined, agree with above plan.  The patient is doing well status post right total knee arthroplasty, no concerns at this time.  Pain is controlled.  Discussed DVT prophylaxis, pain medication use, and safe transition to home.  All questions answered the patient agrees with above plan will go home after clears PT.   Reinaldo Berber MD

## 2022-12-21 NOTE — Anesthesia Postprocedure Evaluation (Signed)
Anesthesia Post Note  Patient: Tracy Long  Procedure(s) Performed: TOTAL KNEE ARTHROPLASTY (Right: Knee)  Patient location during evaluation: Nursing Unit Anesthesia Type: Spinal Level of consciousness: oriented and awake and alert Pain management: pain level controlled Vital Signs Assessment: post-procedure vital signs reviewed and stable Respiratory status: spontaneous breathing and respiratory function stable Cardiovascular status: blood pressure returned to baseline and stable Postop Assessment: no headache, no backache, no apparent nausea or vomiting, patient able to bend at knees and able to ambulate Anesthetic complications: no   There were no known notable events for this encounter.   Last Vitals:  Vitals:   12/20/22 2027 12/20/22 2305  BP: 139/60 138/69  Pulse: 68 65  Resp: 12 16  Temp: (!) 36.2 C 37.1 C  SpO2: 97% 93%    Last Pain:  Vitals:   12/20/22 1954  TempSrc:   PainSc: 5                  Rosanne Gutting

## 2022-12-21 NOTE — TOC Progression Note (Signed)
Transition of Care [facility]) - Progression Note    Patient Details  Name: Tracy Long MRN: [id number] Date of Birth: 1935/05/16  Transition [facility] CM/SW Contact  Marlowe Sax, RN Phone Number: 12/21/2022, 8:58 AM  Clinical Narrative:    Spoke with the patient, she has family support, her daughters will be each staying with her for 2 weels She is set up with [facility] for [facility] PT She has DME at home Gracelyn Nurse, MD PCP   Expected Discharge Plan: Home w Home Health Services Barriers to Discharge: No Barriers Identified  Expected Discharge Plan and Services   Discharge Planning Services: CM Consult   Living arrangements for the past 2 months: Single Family Home Expected Discharge Date: 12/21/22               DME Arranged: N/A DME Agency: NA       HH Arranged: PT, OT HH Agency: [facility] Date HH Agency Contacted: 12/21/22 Time HH Agency Contacted: [phone] Representative spoke with at [facility] Agency: Barbara Cower   Social Determinants of Health (SDOH) Interventions SDOH Screenings   Food Insecurity: No Food Insecurity (12/20/2022)  Housing: Low Risk  (12/20/2022)  Transportation Needs: No Transportation Needs (12/20/2022)  Utilities: Not At Risk (12/20/2022)  Tobacco Use: Low Risk  (12/20/2022)  Recent Concern: Tobacco Use - Medium Risk (12/10/2022)   Received from [facility]    Readmission Risk Interventions     No data to display

## 2022-12-22 ENCOUNTER — Encounter: Payer: Self-pay | Admitting: Orthopedic Surgery

## 2023-01-17 ENCOUNTER — Ambulatory Visit
Admission: EM | Admit: 2023-01-17 | Discharge: 2023-01-17 | Disposition: A | Discharge status: Home / Self Care | Payer: Medicare Other | Attending: Family Medicine | Admitting: Family Medicine

## 2023-01-17 DIAGNOSIS — R109 Unspecified abdominal pain: Secondary | ICD-10-CM

## 2023-01-17 DIAGNOSIS — B379 Candidiasis, unspecified: Secondary | ICD-10-CM | POA: Diagnosis present

## 2023-01-17 DIAGNOSIS — N3001 Acute cystitis with hematuria: Secondary | ICD-10-CM | POA: Diagnosis not present

## 2023-01-17 LAB — URINALYSIS, W/ REFLEX TO CULTURE (INFECTION SUSPECTED)
Bilirubin Urine: NEGATIVE
Glucose, UA: NEGATIVE mg/dL
Ketones, ur: 15 mg/dL — AB
Nitrite: NEGATIVE
Protein, ur: >300 mg/dL — AB
Specific Gravity, Urine: >1.03 — ABNORMAL HIGH (ref 1.005–1.030)
pH: 6 (ref 5.0–8.0)

## 2023-01-17 MED ORDER — FLUCONAZOLE 150 MG PO TABS: 150.0000 mg | ORAL_TABLET | ORAL | 0 refills | Status: AC

## 2023-01-17 MED ORDER — CEFDINIR 300 MG PO CAPS: 300.0000 mg | ORAL_CAPSULE | Freq: Two times a day (BID) | ORAL | 0 refills | Status: AC

## 2023-01-17 NOTE — ED Provider Notes (Signed)
[facility]    CSN: [id number] Arrival date & time: 01/17/23  1639      History   Chief Complaint Chief Complaint  Patient presents with   Flank Pain   Hematuria     HPI   HPI Tracy Long is a 88 y.o. female.    Tracy Long presents for concern for UTI .  Has been having flank pain, dysuria, hematuria and cloudy urine for the past couple of days.  1 point she had some brown with wiping.  Has  not had any antibiotics in last 30 days.  Has history of recurrent UTIs.  She had a episode of urinary incontinence and wears poise pads.  She reports some external vaginal irritation.   No LMP recorded. Patient has had a hysterectomy.    - Abnormal vaginal discharge: no - vaginal odor: no - vaginal bleeding: no - Dysuria: yes  - Hematuria: yes - Urinary urgency: yes  - Urinary frequency: no  - Fever: no - Chills: yes  - Abdominal pain: no  - Pelvic pain: no - Rash/Skin lesions/mouth ulcers: no - Nausea: no  - Vomiting: no  - Back Pain/flank pain: yes       Past Medical History:  Diagnosis Date   Arthritis    Chronically dry eyes    a.) on cyclosporine gtts   Compression fracture of lumbar vertebra (HCC)    a.) s/p L1-L2 kyphoplasty   Coronary artery disease involving native coronary artery of native heart without angina pectoris    a.) s/p MI 1982; b.) s/p type II NSTEMI secondary to AVRT 08/31/2005: LHC 09/01/2005 - normal coronaries; c.) MV 11/03/2016: no ischemia   Depression    Diverticulosis    GERD (gastroesophageal reflux disease)    H/O bladder infections    History of bilateral cataract extraction 2003   History of right breast cancer 2006   a.) CNB (+) ADH with suspicion of DCIS --> s/p partial mastectomy 05/06/2001 (DCIS)   HLD (hyperlipidemia)    Hypertension    Hypothyroidism    Inverted nipple    Iron deficiency    Lower GI bleed 09/13/2022   a.) Tx'd by vascular 09/14/2022 --> s/p microbead embolization of LEFT  colonic branch of IMA and splenic branch of SMA + coil embolization with 2 mm Ruby coils to splenic flexure branch off of LEFT colonic branch of IMA   MCI (mild cognitive impairment)    a.) takes apoequorin   Myocardial infarction (HCC) 1982   Osteopenia    Palpitations    Parkinson disease    Polymyalgia rheumatica (HCC)    Postoperative pulmonary embolism (HCC) 2013   a.) s/p LEFT TKA   PVD (peripheral vascular disease) (HCC)    a.) s/p BILATERAL femoral stent placement in 2000   Raynaud's disease    Rheumatoid arthritis (HCC)    Supraventricular tachycardia    a.) s/p ablation for AVRT 2007; b.) rate/rhythm currently controlled with oral metoprolol succinate   Type 2 myocardial infarction (HCC) 08/31/2005   a.) Type II NSTEMI r/t demand ischemia secondary to SVT/AVRT; b.) LHC 09/01/2005 - normal coronaries; no sig CAD.    Patient Active Problem List   Diagnosis Date Noted   S/P TKR (total knee replacement) using cement, right 12/20/2022   Mesenteric arterial embolization (HCC) 09/15/2022   Lower GI bleed 09/15/2022   Acute blood loss anemia 09/14/2022   Rectal bleeding 09/13/2022    Past Surgical History:  Procedure Laterality Date  BILATERAL FEMORAL STENTS Bilateral 2000   BREAST EXCISIONAL BIOPSY Bilateral    [facility] (BENIGN MASS)   BREAST LUMPECTOMY Right 04/2005   CARDIAC CATHETERIZATION  09/01/2005   CATARACT EXTRACTION W/ INTRAOCULAR LENS  IMPLANT, BILATERAL Bilateral 2003   COLONOSCOPY WITH PROPOFOL N/A 09/14/2022   Procedure: COLONOSCOPY WITH PROPOFOL;  Surgeon: Jaynie Collins, DO;  Location: [facility] ENDOSCOPY;  Service: Gastroenterology;  Laterality: N/A;   EMBOLIZATION (CATH LAB) N/A 09/14/2022   Procedure: EMBOLIZATION;  Surgeon: Annice Needy, MD;  Location: [facility] INVASIVE CV LAB;  Service: Cardiovascular;  Laterality: N/A;   EPIRETINAL MEMBRANE REMOVED Bilateral    ESOPHAGOGASTRODUODENOSCOPY  08/21/2012   L1-L2 KYPHOPLASTY N/A    POSTERIOR TIBIAL TENDON  REPAIR Left    SKIN BIOPSY     SVT ABLATION N/A 2007   TEMPORAL ARTERY BIOPSY / LIGATION     TOTAL ABDOMINAL HYSTERECTOMY W/ BILATERAL SALPINGOOPHORECTOMY  1985   TOTAL KNEE ARTHROPLASTY Left 01/10/2012   TOTAL KNEE ARTHROPLASTY Right 12/20/2022   Procedure: TOTAL KNEE ARTHROPLASTY;  Surgeon: Reinaldo Berber, MD;  Location: [facility] ORS;  Service: Orthopedics;  Laterality: Right;   TUBAL LIGATION      OB History   No obstetric history on file.      Home Medications    Prior to Admission medications   Medication Sig Start Date End Date Taking? Authorizing Provider  cefdinir (OMNICEF) 300 MG capsule Take 1 capsule (300 mg total) by mouth 2 (two) times daily for 5 days. 01/17/23 01/22/23 Yes Laurent Cargile, DO  fluconazole (DIFLUCAN) 150 MG tablet Take 1 tablet (150 mg total) by mouth every 3 (three) days for 2 doses. 01/17/23 01/21/23 Yes Lovada Barwick, DO  acetaminophen (TYLENOL) 500 MG tablet Take 1 tablet (500 mg total) by mouth every 6 (six) hours as needed. 12/21/22   Evon Slack, PA-C  Apoaequorin (PREVAGEN) 10 MG CAPS Take 1 capsule by mouth daily.    [provider]  Bacillus Coagulans-Inulin (ALIGN PREBIOTIC-PROBIOTIC PO) Take 2 tablets by mouth daily.    [provider]  Calcium-Magnesium-Zinc [phone] MG TABS Take 1 tablet by mouth daily.    [provider]  carbidopa-levodopa (SINEMET IR) 25-100 MG tablet Take 1 tablet by mouth 4 (four) times daily. 07/04/20 12/20/22  [provider]  cetirizine (ZYRTEC) 10 MG tablet Take 10 mg by mouth at bedtime.    [provider]  Cholecalciferol (VITAMIN D) 125 MCG (5000 UT) CAPS Take 1 capsule by mouth daily.    [provider]  conjugated estrogens (PREMARIN) vaginal cream Place 1 g vaginally 3 (three) times a week. 1 drop on the urethral for urinary incontinence M-W-F    [provider]  cycloSPORINE (RESTASIS) 0.05 % ophthalmic emulsion Place 1 drop into both eyes 2 (two) times  daily.    [provider]  diclofenac Sodium (VOLTAREN ARTHRITIS PAIN) 1 % GEL Apply 2 g topically daily.    [provider]  docusate sodium (COLACE) 100 MG capsule Take 1 capsule (100 mg total) by mouth 2 (two) times daily. 12/21/22   Evon Slack, PA-C  enoxaparin (LOVENOX) 40 MG/0.4ML injection Inject 0.4 mLs (40 mg total) into the skin daily for 14 days. 12/21/22 01/04/23  Evon Slack, PA-C  fluticasone (FLONASE) 50 MCG/ACT nasal spray Place 1 spray into both nostrils 2 (two) times daily.    [provider]  HYDROcodone-acetaminophen (NORCO/VICODIN) 5-325 MG tablet Take 0.5-1 tablets by mouth every 6 (six) hours as needed for severe pain (  pain score 4-6). 12/21/22   Evon Slack, PA-C  levothyroxine (SYNTHROID) 50 MCG tablet Take 50 mcg by mouth daily before breakfast.    [provider]  metoprolol succinate (TOPROL-XL) 25 MG 24 hr tablet Take 12.5 mg by mouth daily.    [provider]  midodrine (PROAMATINE) 2.5 MG tablet Take 1 tablet (2.5 mg total) by mouth 3 (three) times daily as needed (for top number for blood pressure lower than 90). 09/17/22   Rolly Salter, MD  Multiple Vitamins-Minerals (CENTRUM SILVER 50+WOMEN PO) Take 1 tablet by mouth daily.    [provider]  Multiple Vitamins-Minerals (PRESERVISION AREDS 2) CAPS Take 1 capsule by mouth 2 (two) times daily.    [provider]  Omega-3 Fatty Acids (FISH OIL) 1200 MG CAPS Take 1 capsule by mouth daily.    [provider]  PARoxetine (PAXIL) 10 MG tablet Take 10 mg by mouth at bedtime.    [provider]  Polyethyl Glycol-Propyl Glycol 0.4-0.3 % SOLN Place 1 drop into both eyes 2 (two) times daily.    [provider]  polyethylene glycol powder (GLYCOLAX/MIRALAX) 17 GM/SCOOP powder Take 8.5 g by mouth daily. 1/2 scoop    [provider]    Family History Family History  Problem Relation Age of Onset   Breast cancer Neg Hx      Social History Social History   Tobacco Use   Smoking status: Never   Smokeless tobacco: Never  Vaping Use   Vaping status: Never Used  Substance Use Topics   Alcohol use: No   Drug use: No     Allergies   Aloe, Bactrim [sulfamethoxazole-trimethoprim], Cardizem [diltiazem], Doxycycline, Macrobid [nitrofurantoin], Penicillins, and Statins   Review of Systems Review of Systems: :negative unless otherwise stated in HPI.      Physical Exam Triage Vital Signs ED Triage Vitals  Encounter Vitals Group     BP 01/17/23 1658 126/76     Systolic BP Percentile --      Diastolic BP Percentile --      Pulse Rate 01/17/23 1658 61     Resp 01/17/23 1658 18     Temp 01/17/23 1658 97.9 F (36.6 C)     Temp Source 01/17/23 1658 Oral     SpO2 01/17/23 1658 94 %     Weight --      Height --      Head Circumference --      Peak Flow --      Pain Score 01/17/23 1654 5     Pain Loc --      Pain Education --      Exclude from Growth Chart --    No data found.  Updated Vital Signs BP 126/76 (BP Location: Left Arm)   Pulse 61   Temp 97.9 F (36.6 C) (Oral)   Resp 18   SpO2 94% Comment: hx of Raynauds, fingers are cold  Visual Acuity Right Eye Distance:   Left Eye Distance:   Bilateral Distance:    Right Eye Near:   Left Eye Near:    Bilateral Near:     Physical Exam GEN: well appearing female in no acute distress  CVS: well perfused  RESP: speaking in full sentences without pause  ABD: soft, non-tender, non-distended, no palpable masses, no CVA tenderness SKIN: Warm, dry   UC Treatments / Results  Labs (all labs ordered are listed, but only abnormal results are displayed) Labs Reviewed  URINALYSIS,  W/ REFLEX TO CULTURE (INFECTION SUSPECTED) - Abnormal; Notable for the following components:      Result Value   APPearance CLOUDY (*)    Specific Gravity, Urine >1.030 (*)    Hgb urine dipstick MODERATE (*)    Ketones, ur 15 (*)    Protein, ur >300 (*)     Leukocytes,Ua SMALL (*)    Bacteria, UA FEW (*)    All other components within normal limits  URINE CULTURE    EKG   Radiology No results found.  Procedures Procedures (including critical care time)  Medications Ordered in UC Medications - No data to display  Initial Impression / Assessment and Plan / UC Course  I have reviewed the triage vital signs and the nursing notes.  Pertinent labs & imaging results that were available during my care of the patient were reviewed by me and considered in my medical decision making (see chart for details).     Acute cystitis:  Patient is a 88 y.o. female  with history of recurrent UTI who presents for 2-3 days of dysuria and hematuria.  Overall patient is well-appearing and afebrile.  Vital signs stable.  UA consistent with acute cystitis.  Hematuria supported on microscopy.  See a urologist.  Reports recent knee surgery but states she didn't get a catheter during her surgery. Treat with Cefdinir 2 times daily for 5 days.  Urine culture obtained.  Follow-up sensitivities and change antibiotics, if needed.   Urinalysis also showed some yeast in her urine.  Treat with Diflucan.  - Return precautions including abdominal pain, fever, chills, nausea, or vomiting given. Follow-up,  if symptoms not improving or getting worse. Discussed MDM, treatment plan and plan for follow-up with patient and her daughter who agree with plan.        Final Clinical Impressions(s) / UC Diagnoses   Final diagnoses:  Flank pain  Hematuria due to acute cystitis  Yeast infection     Discharge Instructions      You had evidence of of a bacterial urine infection and likely outside yeast infection today.  Stop by the pharmacy to pick up your prescriptions.  For your UTI: Take Cefdinir 2 times a day for the next 5 days.  I sent your urine for culture as well.  For yeast infection: Take the first dose of Diflucan on day 3 and after you complete your  antibiotics take the last dose.  If your symptoms do not improve in the next 7 days, be sure to follow-up here or at your primary care provider office.  Go to the emergency department if you are having increasing pain, worsening vaginal bleeding or fever.      ED Prescriptions     Medication Sig Dispense Auth. Provider   cefdinir (OMNICEF) 300 MG capsule Take 1 capsule (300 mg total) by mouth 2 (two) times daily for 5 days. 10 capsule Billyjack Trompeter, DO   fluconazole (DIFLUCAN) 150 MG tablet Take 1 tablet (150 mg total) by mouth every 3 (three) days for 2 doses. 2 tablet Katha Cabal, DO      PDMP not reviewed this encounter.   Katha Cabal, DO 01/17/23 2009

## 2023-01-17 NOTE — ED Triage Notes (Signed)
Patient presents to [facility] for UTI. Flank pain, chills, and urgency since yesterday. States hematuria since this morning. No OTC treatment, hx of UTIs.   Denies fever.

## 2023-01-17 NOTE — Discharge Instructions (Addendum)
You had evidence of of a bacterial urine infection and likely outside yeast infection today.  Stop by the pharmacy to pick up your prescriptions.  For your UTI: Take Cefdinir 2 times a day for the next 5 days.  I sent your urine for culture as well.  For yeast infection: Take the first dose of Diflucan on day 3 and after you complete your antibiotics take the last dose.  If your symptoms do not improve in the next 7 days, be sure to follow-up here or at your primary care provider office.  Go to the emergency department if you are having increasing pain, worsening vaginal bleeding or fever.

## 2023-01-18 ENCOUNTER — Other Ambulatory Visit: Payer: Self-pay | Admitting: Internal Medicine

## 2023-01-18 DIAGNOSIS — Z1231 Encounter for screening mammogram for malignant neoplasm of breast: Secondary | ICD-10-CM

## 2023-01-19 LAB — URINE CULTURE: Culture: 50000 — AB

## 2023-01-26 ENCOUNTER — Ambulatory Visit
Admission: RE | Admit: 2023-01-26 | Discharge: 2023-01-26 | Disposition: A | Discharge status: Home / Self Care | Payer: Medicare Other | Source: Ambulatory Visit | Attending: Internal Medicine | Admitting: Internal Medicine

## 2023-01-26 DIAGNOSIS — Z1231 Encounter for screening mammogram for malignant neoplasm of breast: Secondary | ICD-10-CM | POA: Insufficient documentation

## 2023-01-27 ENCOUNTER — Ambulatory Visit
Admission: EM | Admit: 2023-01-27 | Discharge: 2023-01-27 | Disposition: A | Discharge status: Home / Self Care | Payer: Medicare Other | Attending: Family Medicine | Admitting: Family Medicine

## 2023-01-27 DIAGNOSIS — N390 Urinary tract infection, site not specified: Secondary | ICD-10-CM | POA: Diagnosis not present

## 2023-01-27 DIAGNOSIS — R319 Hematuria, unspecified: Secondary | ICD-10-CM | POA: Insufficient documentation

## 2023-01-27 LAB — URINALYSIS, W/ REFLEX TO CULTURE (INFECTION SUSPECTED)
Bilirubin Urine: NEGATIVE
Glucose, UA: NEGATIVE mg/dL
Nitrite: NEGATIVE
Protein, ur: 100 mg/dL — AB
Specific Gravity, Urine: >1.03 — ABNORMAL HIGH (ref 1.005–1.030)
WBC, UA: >50 WBC/hpf (ref 0–5)
pH: 5.5 (ref 5.0–8.0)

## 2023-01-27 MED ORDER — FLUCONAZOLE 150 MG PO TABS: 150.0000 mg | ORAL_TABLET | ORAL | 0 refills | Status: AC

## 2023-01-27 MED ORDER — CEPHALEXIN 500 MG PO CAPS: 500.0000 mg | ORAL_CAPSULE | Freq: Four times a day (QID) | ORAL | 0 refills | Status: AC

## 2023-01-27 NOTE — ED Triage Notes (Addendum)
Pt c/o burning w/urination & hematuria x6 days. Was seen on 9/2 for the same issue & was given cefdinir w/o relief.

## 2023-01-27 NOTE — ED Provider Notes (Signed)
[facility]    CSN: [id number] Arrival date & time: 01/27/23  1005      History   Chief Complaint Chief Complaint  Patient presents with   Dysuria     HPI HPI Tracy Long is a 88 y.o. female.    Tracy Long presents for concern for urinary tract infection.  She just completed her antibiotic course and then her burning with urination returned.  She has been having some blood in her urine..        Past Medical History:  Diagnosis Date   Arthritis    Chronically dry eyes    a.) on cyclosporine gtts   Compression fracture of lumbar vertebra (HCC)    a.) s/p L1-L2 kyphoplasty   Coronary artery disease involving native coronary artery of native heart without angina pectoris    a.) s/p MI 1982; b.) s/p type II NSTEMI secondary to AVRT 08/31/2005: LHC 09/01/2005 - normal coronaries; c.) MV 11/03/2016: no ischemia   Depression    Diverticulosis    GERD (gastroesophageal reflux disease)    H/O bladder infections    History of bilateral cataract extraction 2003   History of right breast cancer 2006   a.) CNB (+) ADH with suspicion of DCIS --> s/p partial mastectomy 05/06/2001 (DCIS)   HLD (hyperlipidemia)    Hypertension    Hypothyroidism    Inverted nipple    Iron deficiency    Lower GI bleed 09/13/2022   a.) Tx'd by vascular 09/14/2022 --> s/p microbead embolization of LEFT colonic branch of IMA and splenic branch of SMA + coil embolization with 2 mm Ruby coils to splenic flexure branch off of LEFT colonic branch of IMA   MCI (mild cognitive impairment)    a.) takes apoequorin   Myocardial infarction (HCC) 1982   Osteopenia    Palpitations    Parkinson disease    Polymyalgia rheumatica (HCC)    Postoperative pulmonary embolism (HCC) 2013   a.) s/p LEFT TKA   PVD (peripheral vascular disease) (HCC)    a.) s/p BILATERAL femoral stent placement in 2000   Raynaud's disease    Rheumatoid arthritis (HCC)    Supraventricular tachycardia     a.) s/p ablation for AVRT 2007; b.) rate/rhythm currently controlled with oral metoprolol succinate   Type 2 myocardial infarction (HCC) 08/31/2005   a.) Type II NSTEMI r/t demand ischemia secondary to SVT/AVRT; b.) LHC 09/01/2005 - normal coronaries; no sig CAD.    Patient Active Problem List   Diagnosis Date Noted   S/P TKR (total knee replacement) using cement, right 12/20/2022   Mesenteric arterial embolization (HCC) 09/15/2022   Lower GI bleed 09/15/2022   Acute blood loss anemia 09/14/2022   Rectal bleeding 09/13/2022    Past Surgical History:  Procedure Laterality Date   BILATERAL FEMORAL STENTS Bilateral 2000   BREAST EXCISIONAL BIOPSY Bilateral    [facility] (BENIGN MASS)   BREAST LUMPECTOMY Right 04/2005   CARDIAC CATHETERIZATION  09/01/2005   CATARACT EXTRACTION W/ INTRAOCULAR LENS  IMPLANT, BILATERAL Bilateral 2003   COLONOSCOPY WITH PROPOFOL N/A 09/14/2022   Procedure: COLONOSCOPY WITH PROPOFOL;  Surgeon: Jaynie Collins, DO;  Location: [facility] ENDOSCOPY;  Service: Gastroenterology;  Laterality: N/A;   EMBOLIZATION (CATH LAB) N/A 09/14/2022   Procedure: EMBOLIZATION;  Surgeon: Annice Needy, MD;  Location: [facility] INVASIVE CV LAB;  Service: Cardiovascular;  Laterality: N/A;   EPIRETINAL MEMBRANE REMOVED Bilateral    ESOPHAGOGASTRODUODENOSCOPY  08/21/2012   L1-L2 KYPHOPLASTY N/A  POSTERIOR TIBIAL TENDON REPAIR Left    SKIN BIOPSY     SVT ABLATION N/A 2007   TEMPORAL ARTERY BIOPSY / LIGATION     TOTAL ABDOMINAL HYSTERECTOMY W/ BILATERAL SALPINGOOPHORECTOMY  1985   TOTAL KNEE ARTHROPLASTY Left 01/10/2012   TOTAL KNEE ARTHROPLASTY Right 12/20/2022   Procedure: TOTAL KNEE ARTHROPLASTY;  Surgeon: Reinaldo Berber, MD;  Location: [facility] ORS;  Service: Orthopedics;  Laterality: Right;   TUBAL LIGATION      OB History   No obstetric history on file.      Home Medications    Prior to Admission medications   Medication Sig Start Date End Date Taking? Authorizing Provider   acetaminophen (TYLENOL) 500 MG tablet Take 1 tablet (500 mg total) by mouth every 6 (six) hours as needed. 12/21/22  Yes Evon Slack, PA-C  Apoaequorin (PREVAGEN) 10 MG CAPS Take 1 capsule by mouth daily.   Yes [provider]  Bacillus Coagulans-Inulin (ALIGN PREBIOTIC-PROBIOTIC PO) Take 2 tablets by mouth daily.   Yes [provider]  Calcium-Magnesium-Zinc [phone] MG TABS Take 1 tablet by mouth daily.   Yes [provider]  carbidopa-levodopa (SINEMET IR) 25-100 MG tablet Take 1 tablet by mouth 4 (four) times daily. 07/04/20 01/27/23 Yes [provider]  cephALEXin (KEFLEX) 500 MG capsule Take 1 capsule (500 mg total) by mouth 4 (four) times daily for 10 days. 01/27/23 02/06/23 Yes Kewanda Poland, DO  cetirizine (ZYRTEC) 10 MG tablet Take 10 mg by mouth at bedtime.   Yes [provider]  Cholecalciferol (VITAMIN D) 125 MCG (5000 UT) CAPS Take 1 capsule by mouth daily.   Yes [provider]  conjugated estrogens (PREMARIN) vaginal cream Place 1 g vaginally 3 (three) times a week. 1 drop on the urethral for urinary incontinence M-W-F   Yes [provider]  cycloSPORINE (RESTASIS) 0.05 % ophthalmic emulsion Place 1 drop into both eyes 2 (two) times daily.   Yes [provider]  diclofenac Sodium (VOLTAREN ARTHRITIS PAIN) 1 % GEL Apply 2 g topically daily.   Yes [provider]  docusate sodium (COLACE) 100 MG capsule Take 1 capsule (100 mg total) by mouth 2 (two) times daily. 12/21/22  Yes Evon Slack, PA-C  enoxaparin (LOVENOX) 40 MG/0.4ML injection Inject 0.4 mLs (40 mg total) into the skin daily for 14 days. 12/21/22 01/27/23 Yes Evon Slack, PA-C  fluconazole (DIFLUCAN) 150 MG tablet Take 1 tablet (150 mg total) by mouth every 3 (three) days for 2 doses. 01/27/23 01/31/23 Yes Jamira Barfuss, DO  fluticasone (FLONASE) 50 MCG/ACT nasal spray Place 1 spray into both nostrils 2 (two) times daily.   Yes [provider]  HYDROcodone-acetaminophen (NORCO/VICODIN) 5-325 MG tablet Take 0.5-1 tablets by mouth every 6 (six) hours as needed for severe pain (pain score 4-6). 12/21/22  Yes Evon Slack, PA-C  levothyroxine (SYNTHROID) 50 MCG tablet Take 50 mcg by mouth daily before breakfast.   Yes [provider]  metoprolol succinate (TOPROL-XL) 25 MG 24 hr tablet Take 12.5 mg by mouth daily.   Yes [provider]  midodrine (PROAMATINE) 2.5 MG tablet Take 1 tablet (2.5 mg total) by mouth 3 (three) times daily as needed (for top number for blood pressure lower than 90). 09/17/22  Yes Rolly Salter, MD  Multiple Vitamins-Minerals (CENTRUM SILVER 50+WOMEN PO) Take 1 tablet by mouth daily.   Yes [provider]  Multiple Vitamins-Minerals (PRESERVISION AREDS 2) CAPS Take 1 capsule by mouth 2 (  two) times daily.   Yes [provider]  Omega-3 Fatty Acids (FISH OIL) 1200 MG CAPS Take 1 capsule by mouth daily.   Yes [provider]  PARoxetine (PAXIL) 10 MG tablet Take 10 mg by mouth at bedtime.   Yes [provider]  Polyethyl Glycol-Propyl Glycol 0.4-0.3 % SOLN Place 1 drop into both eyes 2 (two) times daily.   Yes [provider]  polyethylene glycol powder (GLYCOLAX/MIRALAX) 17 GM/SCOOP powder Take 8.5 g by mouth daily. 1/2 scoop   Yes [provider]    Family History Family History  Problem Relation Age of Onset   Breast cancer Neg Hx     Social History Social History   Tobacco Use   Smoking status: Never   Smokeless tobacco: Never  Vaping Use   Vaping status: Never Used  Substance Use Topics   Alcohol use: No   Drug use: No     Allergies   Cardizem [diltiazem], Macrobid [nitrofurantoin], Statins, Aloe, Doxycycline, Penicillins, and Sulfamethoxazole-trimethoprim   Review of Systems Review of Systems: :negative unless otherwise stated in HPI.      Physical Exam Triage Vital Signs ED Triage Vitals  Encounter  Vitals Group     BP 01/27/23 1051 (!) 144/75     Systolic BP Percentile --      Diastolic BP Percentile --      Pulse Rate 01/27/23 1051 65     Resp 01/27/23 1051 16     Temp 01/27/23 1051 98 F (36.7 C)     Temp Source 01/27/23 1051 Oral     SpO2 01/27/23 1051 97 %     Weight 01/27/23 1050 130 lb (59 kg)     Height 01/27/23 1050 5\' 5"  (1.651 m)     Head Circumference --      Peak Flow --      Pain Score 01/27/23 1055 0     Pain Loc --      Pain Education --      Exclude from Growth Chart --    No data found.  Updated Vital Signs BP (!) 144/75 (BP Location: Left Arm)   Pulse 65   Temp 98 F (36.7 C) (Oral)   Resp 16   Ht 5\' 5"  (1.651 m)   Wt 59 kg   SpO2 97%   BMI 21.63 kg/m   Visual Acuity Right Eye Distance:   Left Eye Distance:   Bilateral Distance:    Right Eye Near:   Left Eye Near:    Bilateral Near:     Physical Exam GEN: well appearing female in no acute distress  CVS: well perfused  RESP: speaking in full sentences without pause      UC Treatments / Results  Labs (all labs ordered are listed, but only abnormal results are displayed) Labs Reviewed  URINALYSIS, W/ REFLEX TO CULTURE (INFECTION SUSPECTED) - Abnormal; Notable for the following components:      Result Value   APPearance TURBID (*)    Specific Gravity, Urine >1.030 (*)    Hgb urine dipstick LARGE (*)    Ketones, ur TRACE (*)    Protein, ur 100 (*)    Leukocytes,Ua SMALL (*)    Bacteria, UA FEW (*)    All other components within normal limits  URINE CULTURE    EKG   Radiology No results found.  Procedures Procedures (including critical care time)  Medications Ordered in UC Medications - No data to display  Initial  Impression / Assessment and Plan / UC Course  I have reviewed the triage vital signs and the nursing notes.  Pertinent labs & imaging results that were available during my care of the patient were reviewed by me and considered in my medical decision making  (see chart for details).     Acute cystitis:  Patient is a 88 y.o. female  who presents for recurent urinary tract infection.  Overall patient is well-appearing and afebrile.  Vital signs stable.  UA consistent with urinary tract infection.  Hematuria supported on microscopy.  Patient has 4 UTI in the past 6 months. Treat with Keflex 4 times daily for 10 days to ensure resolution.  Urine culture obtained again but was pansensitive on 01/17/23.  Follow-up sensitivities and change antibiotics, if needed.   Return precautions including abdominal pain, fever, chills, nausea, or vomiting given. Follow-up,  if symptoms not improving or getting worse. Discussed MDM, treatment plan and plan for follow-up with patient and her daughter who agrees with plan. May need to see a urologist as she has needed to in the past but hasn't been to see one in a while.        Final Clinical Impressions(s) / UC Diagnoses   Final diagnoses:  Urinary tract infection with hematuria, site unspecified     Discharge Instructions      Take Keflex 4 times a day for the next 10 days to be sure that you are urinary tract infection is not up into your kidneys.  Use Diflucan if you start having some yeast related issues.  Stop by the pharmacy to pick up your prescriptions.  Follow up with your primary care provider or urologist if this does not clear your infection.  I sent your urine for culture, someone may call you to ask you to change antibiotics.    ED Prescriptions     Medication Sig Dispense Auth. Provider   cephALEXin (KEFLEX) 500 MG capsule Take 1 capsule (500 mg total) by mouth 4 (four) times daily for 10 days. 40 capsule Zeppelin Beckstrand, DO   fluconazole (DIFLUCAN) 150 MG tablet Take 1 tablet (150 mg total) by mouth every 3 (three) days for 2 doses. 2 tablet Katha Cabal, DO      PDMP not reviewed this encounter.   Katha Cabal, DO 01/27/23 1235

## 2023-01-27 NOTE — Discharge Instructions (Addendum)
Take Keflex 4 times a day for the next 10 days to be sure that you are urinary tract infection is not up into your kidneys.  Use Diflucan if you start having some yeast related issues.  Stop by the pharmacy to pick up your prescriptions.  Follow up with your primary care provider or urologist if this does not clear your infection.  I sent your urine for culture, someone may call you to ask you to change antibiotics.

## 2023-01-30 LAB — URINE CULTURE: Culture: 30000 — AB

## 2023-02-09 ENCOUNTER — Ambulatory Visit
Admission: EM | Admit: 2023-02-09 | Discharge: 2023-02-09 | Disposition: A | Discharge status: Home / Self Care | Payer: Medicare Other | Attending: Emergency Medicine | Admitting: Emergency Medicine

## 2023-02-09 DIAGNOSIS — N390 Urinary tract infection, site not specified: Secondary | ICD-10-CM | POA: Diagnosis present

## 2023-02-09 LAB — URINALYSIS, W/ REFLEX TO CULTURE (INFECTION SUSPECTED)
Glucose, UA: NEGATIVE mg/dL
Nitrite: NEGATIVE
Protein, ur: >300 mg/dL — AB
RBC / HPF: >50 RBC/hpf (ref 0–5)
Specific Gravity, Urine: 1.025 (ref 1.005–1.030)
WBC, UA: >50 WBC/hpf (ref 0–5)
pH: 6 (ref 5.0–8.0)

## 2023-02-09 MED ORDER — PHENAZOPYRIDINE HCL 200 MG PO TABS: 200.0000 mg | ORAL_TABLET | Freq: Three times a day (TID) | ORAL | 0 refills | Status: DC

## 2023-02-09 MED ORDER — LEVOFLOXACIN 500 MG PO TABS: 500.0000 mg | ORAL_TABLET | Freq: Every day | ORAL | 0 refills | Status: DC

## 2023-02-09 NOTE — ED Provider Notes (Signed)
[facility]    CSN: [id number] Arrival date & time: 02/09/23  1021      History   Chief Complaint Chief Complaint  Patient presents with   Hematuria    HPI Tracy Long is a 88 y.o. female.   HPI  88 year old female with a past medical history significant for MI, hypertension, hypothyroidism, GERD, CAD, PVD, Parkinson's, hyperlipidemia, and iron deficiency anemia presents for evaluation of urinary symptoms to include urinary frequency and hematuria that began this a.m.  She was seen here on 01/27/2023 for the same issue and was prescribed Keflex at that time.  Her culture grew out E. coli and Staphylococcus epidermidis.  There is no sensitivity for cephalosporins for the Staphylococcus species but it was resistant to Cipro, clinda, gentamicin, oxacillin, and trimethoprim.  The E. coli was pansensitive.  Past Medical History:  Diagnosis Date   Arthritis    Chronically dry eyes    a.) on cyclosporine gtts   Compression fracture of lumbar vertebra (HCC)    a.) s/p L1-L2 kyphoplasty   Coronary artery disease involving native coronary artery of native heart without angina pectoris    a.) s/p MI 1982; b.) s/p type II NSTEMI secondary to AVRT 08/31/2005: LHC 09/01/2005 - normal coronaries; c.) MV 11/03/2016: no ischemia   Depression    Diverticulosis    GERD (gastroesophageal reflux disease)    H/O bladder infections    History of bilateral cataract extraction 2003   History of right breast cancer 2006   a.) CNB (+) ADH with suspicion of DCIS --> s/p partial mastectomy 05/06/2001 (DCIS)   HLD (hyperlipidemia)    Hypertension    Hypothyroidism    Inverted nipple    Iron deficiency    Lower GI bleed 09/13/2022   a.) Tx'd by vascular 09/14/2022 --> s/p microbead embolization of LEFT colonic branch of IMA and splenic branch of SMA + coil embolization with 2 mm Ruby coils to splenic flexure branch off of LEFT colonic branch of IMA   MCI (mild cognitive  impairment)    a.) takes apoequorin   Myocardial infarction (HCC) 1982   Osteopenia    Palpitations    Parkinson disease    Polymyalgia rheumatica (HCC)    Postoperative pulmonary embolism (HCC) 2013   a.) s/p LEFT TKA   PVD (peripheral vascular disease) (HCC)    a.) s/p BILATERAL femoral stent placement in 2000   Raynaud's disease    Rheumatoid arthritis (HCC)    Supraventricular tachycardia    a.) s/p ablation for AVRT 2007; b.) rate/rhythm currently controlled with oral metoprolol succinate   Type 2 myocardial infarction (HCC) 08/31/2005   a.) Type II NSTEMI r/t demand ischemia secondary to SVT/AVRT; b.) LHC 09/01/2005 - normal coronaries; no sig CAD.    Patient Active Problem List   Diagnosis Date Noted   S/P TKR (total knee replacement) using cement, right 12/20/2022   Mesenteric arterial embolization (HCC) 09/15/2022   Lower GI bleed 09/15/2022   Acute blood loss anemia 09/14/2022   Rectal bleeding 09/13/2022    Past Surgical History:  Procedure Laterality Date   BILATERAL FEMORAL STENTS Bilateral 2000   BREAST EXCISIONAL BIOPSY Bilateral    [facility] (BENIGN MASS)   BREAST LUMPECTOMY Right 04/2005   CARDIAC CATHETERIZATION  09/01/2005   CATARACT EXTRACTION W/ INTRAOCULAR LENS  IMPLANT, BILATERAL Bilateral 2003   COLONOSCOPY WITH PROPOFOL N/A 09/14/2022   Procedure: COLONOSCOPY WITH PROPOFOL;  Surgeon: Jaynie Collins, DO;  Location: [facility];  Service: Gastroenterology;  Laterality: N/A;   EMBOLIZATION (CATH LAB) N/A 09/14/2022   Procedure: EMBOLIZATION;  Surgeon: Annice Needy, MD;  Location: [facility] INVASIVE CV LAB;  Service: Cardiovascular;  Laterality: N/A;   EPIRETINAL MEMBRANE REMOVED Bilateral    ESOPHAGOGASTRODUODENOSCOPY  08/21/2012   L1-L2 KYPHOPLASTY N/A    POSTERIOR TIBIAL TENDON REPAIR Left    SKIN BIOPSY     SVT ABLATION N/A 2007   TEMPORAL ARTERY BIOPSY / LIGATION     TOTAL ABDOMINAL HYSTERECTOMY W/ BILATERAL SALPINGOOPHORECTOMY  1985   TOTAL  KNEE ARTHROPLASTY Left 01/10/2012   TOTAL KNEE ARTHROPLASTY Right 12/20/2022   Procedure: TOTAL KNEE ARTHROPLASTY;  Surgeon: Reinaldo Berber, MD;  Location: [facility] ORS;  Service: Orthopedics;  Laterality: Right;   TUBAL LIGATION      OB History   No obstetric history on file.      Home Medications    Prior to Admission medications   Medication Sig Start Date End Date Taking? Authorizing Provider  acetaminophen (TYLENOL) 500 MG tablet Take 1 tablet (500 mg total) by mouth every 6 (six) hours as needed. 12/21/22  Yes Evon Slack, PA-C  Apoaequorin (PREVAGEN) 10 MG CAPS Take 1 capsule by mouth daily.   Yes [provider]  Bacillus Coagulans-Inulin (ALIGN PREBIOTIC-PROBIOTIC PO) Take 2 tablets by mouth daily.   Yes [provider]  Calcium-Magnesium-Zinc [phone] MG TABS Take 1 tablet by mouth daily.   Yes [provider]  carbidopa-levodopa (SINEMET IR) 25-100 MG tablet Take 1 tablet by mouth 4 (four) times daily. 07/04/20 02/09/23 Yes [provider]  cetirizine (ZYRTEC) 10 MG tablet Take 10 mg by mouth at bedtime.   Yes [provider]  Cholecalciferol (VITAMIN D) 125 MCG (5000 UT) CAPS Take 1 capsule by mouth daily.   Yes [provider]  conjugated estrogens (PREMARIN) vaginal cream Place 1 g vaginally 3 (three) times a week. 1 drop on the urethral for urinary incontinence M-W-F   Yes [provider]  cycloSPORINE (RESTASIS) 0.05 % ophthalmic emulsion Place 1 drop into both eyes 2 (two) times daily.   Yes [provider]  diclofenac Sodium (VOLTAREN ARTHRITIS PAIN) 1 % GEL Apply 2 g topically daily.   Yes [provider]  docusate sodium (COLACE) 100 MG capsule Take 1 capsule (100 mg total) by mouth 2 (two) times daily. 12/21/22  Yes Evon Slack, PA-C  enoxaparin (LOVENOX) 40 MG/0.4ML injection Inject 0.4 mLs (40 mg total) into the skin daily for 14 days. 12/21/22 02/09/23 Yes Evon Slack, PA-C   fluticasone (FLONASE) 50 MCG/ACT nasal spray Place 1 spray into both nostrils 2 (two) times daily.   Yes [provider]  HYDROcodone-acetaminophen (NORCO/VICODIN) 5-325 MG tablet Take 0.5-1 tablets by mouth every 6 (six) hours as needed for severe pain (pain score 4-6). 12/21/22  Yes Evon Slack, PA-C  levofloxacin (LEVAQUIN) 500 MG tablet Take 1 tablet (500 mg total) by mouth daily. 02/09/23  Yes Becky Augusta, NP  levothyroxine (SYNTHROID) 50 MCG tablet Take 50 mcg by mouth daily before breakfast.   Yes [provider]  metoprolol succinate (TOPROL-XL) 25 MG 24 hr tablet Take 12.5 mg by mouth daily.   Yes [provider]  midodrine (PROAMATINE) 2.5 MG tablet Take 1 tablet (2.5 mg total) by mouth 3 (three) times daily as needed (for top number for blood pressure lower than 90). 09/17/22  Yes Rolly Salter, MD  Multiple Vitamins-Minerals (CENTRUM SILVER 50+WOMEN PO) Take 1 tablet by  mouth daily.   Yes [provider]  Multiple Vitamins-Minerals (PRESERVISION AREDS 2) CAPS Take 1 capsule by mouth 2 (two) times daily.   Yes [provider]  Omega-3 Fatty Acids (FISH OIL) 1200 MG CAPS Take 1 capsule by mouth daily.   Yes [provider]  PARoxetine (PAXIL) 10 MG tablet Take 10 mg by mouth at bedtime.   Yes [provider]  phenazopyridine (PYRIDIUM) 200 MG tablet Take 1 tablet (200 mg total) by mouth 3 (three) times daily. 02/09/23  Yes Becky Augusta, NP  Polyethyl Glycol-Propyl Glycol 0.4-0.3 % SOLN Place 1 drop into both eyes 2 (two) times daily.   Yes [provider]  polyethylene glycol powder (GLYCOLAX/MIRALAX) 17 GM/SCOOP powder Take 8.5 g by mouth daily. 1/2 scoop   Yes [provider]    Family History Family History  Problem Relation Age of Onset   Breast cancer Neg Hx     Social History Social History   Tobacco Use   Smoking status: Never   Smokeless tobacco: Never  Vaping Use   Vaping status: Never  Used  Substance Use Topics   Alcohol use: No   Drug use: No     Allergies   Diltiazem hcl, Cardizem [diltiazem], Statins, Aloe, Doxycycline, Nitrofurantoin, Penicillins, and Sulfamethoxazole-trimethoprim   Review of Systems Review of Systems  Constitutional:  Negative for fever.  Gastrointestinal:  Negative for nausea.  Genitourinary:  Positive for dysuria, frequency, hematuria and urgency.  Musculoskeletal:  Negative for back pain.     Physical Exam Triage Vital Signs ED Triage Vitals  Encounter Vitals Group     BP --      Systolic BP Percentile --      Diastolic BP Percentile --      Pulse --      Resp 02/09/23 1103 16     Temp --      Temp Source 02/09/23 1103 Oral     SpO2 --      Weight 02/09/23 1102 130 lb (59 kg)     Height 02/09/23 1102 5\' 5"  (1.651 m)     Head Circumference --      Peak Flow --      Pain Score 02/09/23 1106 0     Pain Loc --      Pain Education --      Exclude from Growth Chart --    No data found.  Updated Vital Signs BP (!) 142/84 (BP Location: Left Arm)   Pulse 68   Temp 98 F (36.7 C) (Oral)   Resp 16   Ht 5\' 5"  (1.651 m)   Wt 130 lb (59 kg)   SpO2 95%   BMI 21.63 kg/m   Visual Acuity Right Eye Distance:   Left Eye Distance:   Bilateral Distance:    Right Eye Near:   Left Eye Near:    Bilateral Near:     Physical Exam Vitals and nursing note reviewed.  Constitutional:      Appearance: Normal appearance. She is not ill-appearing.  HENT:     Head: Normocephalic and atraumatic.  Cardiovascular:     Rate and Rhythm: Normal rate.     Pulses: Normal pulses.     Heart sounds: Normal heart sounds. No murmur heard.    No friction rub. No gallop.  Pulmonary:     Effort: Pulmonary effort is normal.     Breath sounds: Normal breath sounds. No wheezing, rhonchi or rales.  Abdominal:  Tenderness: There is no right CVA tenderness or left CVA tenderness.  Skin:    General: Skin is warm.     Capillary Refill: Capillary  refill takes less than 2 seconds.  Neurological:     General: No focal deficit present.     Mental Status: She is alert and oriented to person, place, and time.      UC Treatments / Results  Labs (all labs ordered are listed, but only abnormal results are displayed) Labs Reviewed  URINALYSIS, W/ REFLEX TO CULTURE (INFECTION SUSPECTED) - Abnormal; Notable for the following components:      Result Value   APPearance TURBID (*)    Hgb urine dipstick LARGE (*)    Bilirubin Urine SMALL (*)    Ketones, ur TRACE (*)    Protein, ur >300 (*)    Leukocytes,Ua LARGE (*)    Bacteria, UA MANY (*)    All other components within normal limits  URINE CULTURE    EKG   Radiology No results found.  Procedures Procedures (including critical care time)  Medications Ordered in UC Medications - No data to display  Initial Impression / Assessment and Plan / UC Course  I have reviewed the triage vital signs and the nursing notes.  Pertinent labs & imaging results that were available during my care of the patient were reviewed by me and considered in my medical decision making (see chart for details).   Patient is a pleasant, nontoxic-appearing 88 year old female presenting for evaluation of urinary complaints as outlined HPI above.  She was recently treated for UTI on 01/27/2023 with Keflex 500 mg 4 times daily for 10 days.  She reports that her symptoms improve while she was on antibiotics but then returned when she finished them.  The burning, urgency, frequency, and hematuria all began last night.  As mentioned HPI above, patient's urine culture from that date grew out both E. coli and Staphylococcus epidermidis.  The E. coli was pansensitive but the Staphylococcus was resistant to all antibiotics besides vancomycin, tetracycline, rifampin, and nitrofurantoin.  Patient has multiple medication allergies to include doxycycline which causes hives, penicillin, Bactrim, and  nitrofurantoin.  Urinalysis has turbid appearance with large hemoglobin, small bilirubin, trace ketones, greater than 300 protein, and large leukocyte esterase.  Negative for nitrites.  Reflex microscopy shows large WBC and RBC count as well as many bacteria.  Urine will reflex to culture.  Due to patient's multiple allergies and the sensitivity reporting from the last culture on 01/27/2023 I called and spoke with Queen Blossom, the pharmacist at [facility].  She will contact the infectious disease pharmacist for treatment recommendations given that the patient is allergic to all other oral medications listed as MRSE sensitive.  The ID pharmacist, Carney Living, is unsure if the MRSE is causing the UTI.  He does not have a good oral option given patient's current allergy list.  He is recommending urinary analgesic along with urology referral.  I will discharge patient home on Levaquin 500 milligrams once daily for 7 days while the culture is pending.  If the patient develops any worsening urinary symptoms, fever, nausea, or vomiting she should report to the ER for evaluation.    Final Clinical Impressions(s) / UC Diagnoses   Final diagnoses:  Lower urinary tract infectious disease     Discharge Instructions      Take the Levaquin 500 mg once daily for 7 days for treatment of your UTI.Marland Kitchen  Use the Pyridium every 8 hours as  needed for urinary discomfort.  This will turn your urine a bright red-orange.  Increase your oral fluid intake so that you increase your urine production and or flushing your urinary system.  Take an over-the-counter probiotic, such as Culturelle-Align-Activia, 1 hour after each dose of antibiotic to prevent diarrhea or yeast infections from forming.  We will culture urine and change the antibiotics if necessary.  I have referred you to urology for follow-up evaluation.  They will call you to make an appointment.  Return for reevaluation, or see your primary care  provider, for any new or worsening symptoms.      ED Prescriptions     Medication Sig Dispense Auth. Provider   levofloxacin (LEVAQUIN) 500 MG tablet Take 1 tablet (500 mg total) by mouth daily. 7 tablet Becky Augusta, NP   phenazopyridine (PYRIDIUM) 200 MG tablet Take 1 tablet (200 mg total) by mouth 3 (three) times daily. 6 tablet Becky Augusta, NP      PDMP not reviewed this encounter.   Becky Augusta, NP 02/09/23 1301

## 2023-02-09 NOTE — Discharge Instructions (Addendum)
Take the Levaquin 500 mg once daily for 7 days for treatment of your UTI.Marland Kitchen  Use the Pyridium every 8 hours as needed for urinary discomfort.  This will turn your urine a bright red-orange.  Increase your oral fluid intake so that you increase your urine production and or flushing your urinary system.  Take an over-the-counter probiotic, such as Culturelle-Align-Activia, 1 hour after each dose of antibiotic to prevent diarrhea or yeast infections from forming.  We will culture urine and change the antibiotics if necessary.  I have referred you to urology for follow-up evaluation.  They will call you to make an appointment.  Return for reevaluation, or see your primary care provider, for any new or worsening symptoms.

## 2023-02-09 NOTE — ED Triage Notes (Signed)
Pt c/o urinary freq & hematuria since this AM. Was seen on 9/12 for the same issue & was given keflex w/o relief.

## 2023-02-10 LAB — URINE CULTURE: Culture: 50000 — AB

## 2023-02-11 LAB — URINE CULTURE

## 2023-02-22 ENCOUNTER — Ambulatory Visit: Payer: Self-pay | Admitting: Urology

## 2023-03-08 ENCOUNTER — Ambulatory Visit: Payer: Medicare Other | Admitting: Urology

## 2023-03-08 VITALS — BP 159/88 | HR 62 | Ht 65.0 in | Wt 135.4 lb

## 2023-03-08 DIAGNOSIS — R350 Frequency of micturition: Secondary | ICD-10-CM | POA: Diagnosis not present

## 2023-03-08 DIAGNOSIS — N390 Urinary tract infection, site not specified: Secondary | ICD-10-CM

## 2023-03-08 DIAGNOSIS — R3129 Other microscopic hematuria: Secondary | ICD-10-CM | POA: Diagnosis not present

## 2023-03-08 DIAGNOSIS — Z87440 Personal history of urinary (tract) infections: Secondary | ICD-10-CM

## 2023-03-08 LAB — URINALYSIS, COMPLETE
Bilirubin, UA: NEGATIVE
Glucose, UA: NEGATIVE
Nitrite, UA: NEGATIVE
Protein,UA: NEGATIVE
RBC, UA: NEGATIVE
Specific Gravity, UA: 1.025 (ref 1.005–1.030)
Urobilinogen, Ur: 0.2 mg/dL (ref 0.2–1.0)
pH, UA: 5 (ref 5.0–7.5)

## 2023-03-08 LAB — MICROSCOPIC EXAMINATION

## 2023-03-08 LAB — BLADDER SCAN AMB NON-IMAGING: Scan Result: 1

## 2023-03-08 NOTE — Progress Notes (Signed)
I,Amy L Pierron,acting as a scribe for Vanna Scotland, MD.,have documented all relevant documentation on the behalf of Vanna Scotland, MD,as directed by  Vanna Scotland, MD while in the presence of Vanna Scotland, MD.  03/08/2023 3:49 PM   Tracy Long May 23, 1934 [id number]  Referring provider: Becky Augusta, NP [address] [place],  [place] [address]  Chief Complaint  Patient presents with   Establish Care   Urinary Frequency    HPI: 88 year-old female referred for further evaluation of recurrent urinary tract infections.  Lengthy chart review indicates she has had multiple symptomatic urinary tract infections with at least 3 positive culture in September alone; primarily E. Coli resistant to Ampicillin and Unasyn. She's been treated with multiple rounds of antibiotics and is not currently on topical estrogen cream.   Most recent upper tract imaging consists of a CT angio on 09/13/2022 for a GI bleed that showed normal kidneys with no hydronephrosis and unremarkable bladder.   Her urinalysis today shows 3 to 10 red blood cells, 6-10 white blood cells, few bacteria, nitrate negative.  She is accompanied today by her daughter.  She saw a gyn last year to have a growth removed from her labia and was told her bladder is falling. She doesn't have any pressure but feels something down there when she wipes after urinating.   She doesn't feel that she has a UTI now. When she does she has symptoms of burning, hematuria, urgency, and frequency.   She has been using a pea size amount of Premarin around her urethra 3x a week for many years. She has not ever tried using cranberry tablets and isn't sure if she takes a probiotic.   Results for orders placed or performed in visit on 03/08/23  Bladder Scan (Post Void Residual) in office  Result Value Ref Range   Scan Result 1 ml     PMH: Past Medical History:  Diagnosis Date   Arthritis    Chronically dry eyes    a.)  on cyclosporine gtts   Compression fracture of lumbar vertebra (HCC)    a.) s/p L1-L2 kyphoplasty   Coronary artery disease involving native coronary artery of native heart without angina pectoris    a.) s/p MI 1982; b.) s/p type II NSTEMI secondary to AVRT 08/31/2005: LHC 09/01/2005 - normal coronaries; c.) MV 11/03/2016: no ischemia   Depression    Diverticulosis    GERD (gastroesophageal reflux disease)    H/O bladder infections    History of bilateral cataract extraction 2003   History of right breast cancer 2006   a.) CNB (+) ADH with suspicion of DCIS --> s/p partial mastectomy 05/06/2001 (DCIS)   HLD (hyperlipidemia)    Hypertension    Hypothyroidism    Inverted nipple    Iron deficiency    Lower GI bleed 09/13/2022   a.) Tx'd by vascular 09/14/2022 --> s/p microbead embolization of LEFT colonic branch of IMA and splenic branch of SMA + coil embolization with 2 mm Ruby coils to splenic flexure branch off of LEFT colonic branch of IMA   MCI (mild cognitive impairment)    a.) takes apoequorin   Myocardial infarction (HCC) 1982   Osteopenia    Palpitations    Parkinson disease    Polymyalgia rheumatica (HCC)    Postoperative pulmonary embolism (HCC) 2013   a.) s/p LEFT TKA   PVD (peripheral vascular disease) (HCC)    a.) s/p BILATERAL femoral stent placement in 2000  Raynaud's disease    Rheumatoid arthritis (HCC)    Supraventricular tachycardia    a.) s/p ablation for AVRT 2007; b.) rate/rhythm currently controlled with oral metoprolol succinate   Type 2 myocardial infarction (HCC) 08/31/2005   a.) Type II NSTEMI r/t demand ischemia secondary to SVT/AVRT; b.) LHC 09/01/2005 - normal coronaries; no sig CAD.    Surgical History: Past Surgical History:  Procedure Laterality Date   BILATERAL FEMORAL STENTS Bilateral 2000   BREAST EXCISIONAL BIOPSY Bilateral    [facility] (BENIGN MASS)   BREAST LUMPECTOMY Right 04/2005   CARDIAC CATHETERIZATION  09/01/2005   CATARACT  EXTRACTION W/ INTRAOCULAR LENS  IMPLANT, BILATERAL Bilateral 2003   COLONOSCOPY WITH PROPOFOL N/A 09/14/2022   Procedure: COLONOSCOPY WITH PROPOFOL;  Surgeon: Jaynie Collins, DO;  Location[facility] ENDOSCOPY;  Service: Gastroenterology;  Laterality: N/A;   EMBOLIZATION (CATH LAB) N/A 09/14/2022   Procedure: EMBOLIZATION;  Surgeon: Annice Needy, MD;  Location: [facility] INVASIVE CV LAB;  Service: Cardiovascular;  Laterality: N/A;   EPIRETINAL MEMBRANE REMOVED Bilateral    ESOPHAGOGASTRODUODENOSCOPY  08/21/2012   L1-L2 KYPHOPLASTY N/A    POSTERIOR TIBIAL TENDON REPAIR Left    SKIN BIOPSY     SVT ABLATION N/A 2007   TEMPORAL ARTERY BIOPSY / LIGATION     TOTAL ABDOMINAL HYSTERECTOMY W/ BILATERAL SALPINGOOPHORECTOMY  1985   TOTAL KNEE ARTHROPLASTY Left 01/10/2012   TOTAL KNEE ARTHROPLASTY Right 12/20/2022   Procedure: TOTAL KNEE ARTHROPLASTY;  Surgeon: Reinaldo Berber, MD;  Location: [facility] ORS;  Service: Orthopedics;  Laterality: Right;   TUBAL LIGATION      Home Medications:  Allergies as of 03/08/2023       Reactions   Diltiazem Hcl Hives   Cardizem [diltiazem] Hives   Statins Other (See Comments)   Unknown reaction   Aloe Hives, Rash   Doxycycline Hives, Other (See Comments)   RASH - HAND ITCHING   Nitrofurantoin Hives, Rash   08-11-12 per pt caused Esophagitis and Urticaria   Penicillins Hives, Rash   Sulfamethoxazole-trimethoprim Nausea Only        Medication List        Accurate as of March 08, 2023  3:49 PM. If you have any questions, ask your nurse or doctor.          STOP taking these medications    enoxaparin 40 MG/0.4ML injection Commonly known as: LOVENOX   HYDROcodone-acetaminophen 5-325 MG tablet Commonly known as: NORCO/VICODIN   levofloxacin 500 MG tablet Commonly known as: LEVAQUIN   phenazopyridine 200 MG tablet Commonly known as: PYRIDIUM       TAKE these medications    acetaminophen 500 MG tablet Commonly known as: TYLENOL Take 1  tablet (500 mg total) by mouth every 6 (six) hours as needed.   ALIGN PREBIOTIC-PROBIOTIC PO Take 2 tablets by mouth daily.   Calcium-Magnesium-Zinc 333-133-5 MG Tabs Take 1 tablet by mouth daily.   carbidopa-levodopa 25-100 MG tablet Commonly known as: SINEMET IR Take 1 tablet by mouth 4 (four) times daily.   cetirizine 10 MG tablet Commonly known as: ZYRTEC Take 10 mg by mouth at bedtime.   conjugated estrogens 0.625 MG/GM vaginal cream Commonly known as: PREMARIN Place 1 g vaginally 3 (three) times a week. 1 drop on the urethral for urinary incontinence M-W-F   cycloSPORINE 0.05 % ophthalmic emulsion Commonly known as: RESTASIS Place 1 drop into both eyes 2 (two) times daily.   docusate sodium 100 MG capsule Commonly known as: COLACE Take 1 capsule (100  mg total) by mouth 2 (two) times daily.   Fish Oil 1200 MG Caps Take 1 capsule by mouth daily.   fluticasone 50 MCG/ACT nasal spray Commonly known as: FLONASE Place 1 spray into both nostrils 2 (two) times daily.   levothyroxine 50 MCG tablet Commonly known as: SYNTHROID Take 50 mcg by mouth daily before breakfast.   metoprolol succinate 25 MG 24 hr tablet Commonly known as: TOPROL-XL Take 12.5 mg by mouth daily.   midodrine 2.5 MG tablet Commonly known as: PROAMATINE Take 1 tablet (2.5 mg total) by mouth 3 (three) times daily as needed (for top number for blood pressure lower than 90).   PARoxetine 10 MG tablet Commonly known as: PAXIL Take 10 mg by mouth at bedtime.   Polyethyl Glycol-Propyl Glycol 0.4-0.3 % Soln Place 1 drop into both eyes 2 (two) times daily.   polyethylene glycol powder 17 GM/SCOOP powder Commonly known as: GLYCOLAX/MIRALAX Take 8.5 g by mouth daily. 1/2 scoop as needed   PreserVision AREDS 2 Caps Take 1 capsule by mouth 2 (two) times daily.   CENTRUM SILVER 50+WOMEN PO Take 1 tablet by mouth daily.   Prevagen 10 MG Caps Generic drug: Apoaequorin Take 1 capsule by mouth  daily.   Vitamin D 125 MCG (5000 UT) Caps Take 1 capsule by mouth daily.   Voltaren Arthritis Pain 1 % Gel Generic drug: diclofenac Sodium Apply 2 g topically daily.        Allergies:  Allergies  Allergen Reactions   Diltiazem Hcl Hives   Cardizem [Diltiazem] Hives   Statins Other (See Comments)    Unknown reaction   Aloe Hives and Rash   Doxycycline Hives and Other (See Comments)    RASH - HAND ITCHING   Nitrofurantoin Hives and Rash    08-11-12 per pt caused Esophagitis and Urticaria   Penicillins Hives and Rash   Sulfamethoxazole-Trimethoprim Nausea Only    Family History: Family History  Problem Relation Age of Onset   Breast cancer Neg Hx     Social History:  reports that she has never smoked. She has never used smokeless tobacco. She reports that she does not drink alcohol and does not use drugs.   Physical Exam: BP (!) 159/88   Pulse 62   Ht 5\' 5"  (1.651 m)   Wt 135 lb 6 oz (61.4 kg)   BMI 22.53 kg/m   Constitutional:  Alert and oriented, No acute distress. HEENT: [place] AT, moist mucus membranes.  Trachea midline, no masses. Neurologic: Grossly intact, no focal deficits, moving all 4 extremities. Psychiatric: Normal mood and affect.   Pertinent Imaging: CLINICAL DATA:  Painless hematochezia   EXAM: CTA ABDOMEN AND PELVIS WITHOUT AND WITH CONTRAST   TECHNIQUE: Multidetector CT imaging of the abdomen and pelvis was performed using the standard protocol during bolus administration of intravenous contrast. Multiplanar reconstructed images and MIPs were obtained and reviewed to evaluate the vascular anatomy.   RADIATION DOSE REDUCTION: This exam was performed according to the departmental dose-optimization program which includes automated exposure control, adjustment of the mA and/or kV according to patient size and/or use of iterative reconstruction technique.   CONTRAST:  OMNIPAQUE IOHEXOL 350 MG/ML SOLN   COMPARISON:  None Available.    FINDINGS: VASCULAR   Aorta: Non contrasted images demonstrate no acute intramural hematoma. Nonaneurysmal aorta. No dissection is seen. Moderate aortic atherosclerosis   Celiac: Patent without evidence of aneurysm, dissection, vasculitis or significant stenosis.   SMA: Patent without evidence of aneurysm,  dissection, vasculitis or significant stenosis.   Renals: Both renal arteries are patent without evidence of dissection, vasculitis, fibromuscular dysplasia or significant stenosis. Calcification at the origin of left renal artery but no significant stenosis. 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   IMA: Diminutive appearing inferior mesenteric artery, but appears patent on sagittal images. No occlusion or aneurysm.   Inflow: Stent within the left external iliac artery which is patent. Mild aortic atherosclerosis. No aneurysm or dissection.   Proximal Outflow: Bilateral common femoral and visualized portions of the superficial and profunda femoral arteries are patent without evidence of aneurysm, dissection, vasculitis or significant stenosis.   Review of the MIP images confirms the above findings.   NON-VASCULAR   Lower chest: Lung bases demonstrate no acute airspace disease. 6 mm left anterior lung base pulmonary nodule, series 16, image 2.   Hepatobiliary: No focal liver abnormality is seen. No gallstones, gallbladder wall thickening, or biliary dilatation.   Pancreas: Unremarkable. No pancreatic ductal dilatation or surrounding inflammatory changes.   Spleen: Normal in size without focal abnormality.   Adrenals/Urinary Tract: Adrenal glands are within normal limits. Kidneys show no hydronephrosis. The bladder is unremarkable   Stomach/Bowel: The stomach is nonenlarged. No dilated small bowel. Diffuse diverticular disease of the colon without acute wall thickening. Large diverticulum at the cecum.   Lymphatic: No suspicious lymph nodes    Reproductive: Hysterectomy.  No adnexal mass   Other: Negative for pelvic effusion or free air   Musculoskeletal: Treated compression deformities at L1 and L2. Suspected chronic superior endplate deformity at L4. Trace retrolisthesis L2 on L3.   IMPRESSION: VASCULAR   1. Negative for active intraluminal extravasation to suggest active GI bleeding at this time. 2. Aortic atherosclerosis without evidence for dissection. 3. 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 Extensive diverticular disease of the colon without acute inflammatory changes 2. No CT evidence for acute intra-abdominal or pelvic abnormality 3. 6 mm left lung base pulmonary nodule. Non-contrast chest CT at 6-12 months is recommended. If the nodule is stable at time of repeat CT, then future CT at 18-24 months (from today's scan) is considered optional for low-risk patients, but is recommended for high-risk patients. This recommendation follows the consensus statement: Guidelines for Management of Incidental Pulmonary Nodules Detected on CT Images: From the Fleischner Society 2017; Radiology 2017; 284:228-243.  Electronically Signed   By: Jasmine Pang M.D.   On: 09/13/2022 02:02 Personally reviewed today and agree with radiologic interpretation.    Assessment & Plan:    1. Recurrent UTI's  - Encouraged her to keep using Premarin and to add in probiotics, cranberry tablets, and D-mannose, and good hydration.  - Discussed if these options don't help may consider a daily suppression antibiotic as a last resort.   - The next time she presents with symptoms would be interested in having her catheterized for a sample to eliminate any excess contamination.  -No upper tract pathology, emptying bladder adequately -Unlikely that prolapse is playing a role in this given her ability to empty her bladder completely and she is otherwise asymptomatic from this  2. Microscopic hematuria  - Likely  related to recent infection. Plan to recheck next month.  If microscopic hematuria persist, consider cystoscopy/workup for microscopic hematuria -Also consider catheterized if she does not fact have improved, may be related to contamination  Return in about 1 month (around 04/08/2023) for UA.  I have reviewed the above  documentation for accuracy and completeness, and I agree with the above.   Vanna Scotland, MD   [facility] [address], [address] [place], [place] [address] [phone]

## 2023-03-08 NOTE — Patient Instructions (Signed)
For UTI prevention take cranberry tablets, probiotic, D-Mannose daily.  

## 2023-04-01 NOTE — Progress Notes (Unsigned)
04/05/2023 3:06 PM   Tracy Long [birth year] [id number]  Referring provider: Gracelyn Nurse, MD [address] [facility] [address] [facility] [place],  [place] [address]  Urological history: 1. rUTI's -Contributing factors of age, GSM and constipation -Documented urine cultures over the last year  February 09, 2023 E. Coli  January 27, 2023 E. coli and Staphylococcus epidermis  January 17, 2023 E. Coli  October 30, 2022 less than 10,000 colonies  Oct 01, 2022 E. Coli -Vaginal estrogen cream 3 nights weekly, probiotics, cranberry tablets, d-mannose and good hydration  2. Pelvic floor prolapse  Chief Complaint  Patient presents with   Follow-up   HPI: Tracy Long is a 88 y.o. female who presents today for one month follow up.    Previous records reviewed.   She was seen by Dr. Apolinar Junes on 03/08/2023 for complaints of rUTI's and with an incidental finding of microscopic hematuria.  She was encouraged to continue her vaginal estrogen cream and to add cranberry tablets, D-mannose and good hydration.  She was to return in one month for repeat UA.    She has been doing well since continuing the vaginal estrogen cream and adding the supplements.  She states she does not notice any cloudiness to her urine anymore.  Patient denies any modifying or aggravating factors.  Patient denies any recent UTI's, gross hematuria, dysuria or suprapubic/flank pain.  Patient denies any fevers, chills, nausea or vomiting.    She has some mild stress urinary incontinence.  She has nocturia x 2-3 nightly which is not bothersome to her as she goes right back to sleep.  PMH: Past Medical History:  Diagnosis Date   Arthritis    Chronically dry eyes    a.) on cyclosporine gtts   Compression fracture of lumbar vertebra (HCC)    a.) s/p L1-L2 kyphoplasty   Coronary artery disease involving native coronary artery of native heart without angina pectoris    a.) s/p MI 1982; b.) s/p type II  NSTEMI secondary to AVRT 08/31/2005: LHC 09/01/2005 - normal coronaries; c.) MV 11/03/2016: no ischemia   Depression    Diverticulosis    GERD (gastroesophageal reflux disease)    H/O bladder infections    History of bilateral cataract extraction 2003   History of right breast cancer 2006   a.) CNB (+) ADH with suspicion of DCIS --> s/p partial mastectomy 05/06/2001 (DCIS)   HLD (hyperlipidemia)    Hypertension    Hypothyroidism    Inverted nipple    Iron deficiency    Lower GI bleed 09/13/2022   a.) Tx'd by vascular 09/14/2022 --> s/p microbead embolization of LEFT colonic branch of IMA and splenic branch of SMA + coil embolization with 2 mm Ruby coils to splenic flexure branch off of LEFT colonic branch of IMA   MCI (mild cognitive impairment)    a.) takes apoequorin   Myocardial infarction (HCC) 1982   Osteopenia    Palpitations    Parkinson disease (HCC)    Polymyalgia rheumatica (HCC)    Postoperative pulmonary embolism (HCC) 2013   a.) s/p LEFT TKA   PVD (peripheral vascular disease) (HCC)    a.) s/p BILATERAL femoral stent placement in 2000   Raynaud's disease    Rheumatoid arthritis (HCC)    Supraventricular tachycardia (HCC)    a.) s/p ablation for AVRT 2007; b.) rate/rhythm currently controlled with oral metoprolol succinate   Type 2 myocardial infarction (HCC) 08/31/2005   a.) Type II NSTEMI r/t demand  ischemia secondary to SVT/AVRT; b.) LHC 09/01/2005 - normal coronaries; no sig CAD.    Surgical History: Past Surgical History:  Procedure Laterality Date   BILATERAL FEMORAL STENTS Bilateral 2000   BREAST EXCISIONAL BIOPSY Bilateral    [facility] (BENIGN MASS)   BREAST LUMPECTOMY Right 04/2005   CARDIAC CATHETERIZATION  09/01/2005   CATARACT EXTRACTION W/ INTRAOCULAR LENS  IMPLANT, BILATERAL Bilateral 2003   COLONOSCOPY WITH PROPOFOL N/A 09/14/2022   Procedure: COLONOSCOPY WITH PROPOFOL;  Surgeon: Jaynie Collins, DO;  Location: [facility] ENDOSCOPY;  Service:  Gastroenterology;  Laterality: N/A;   EMBOLIZATION (CATH LAB) N/A 09/14/2022   Procedure: EMBOLIZATION;  Surgeon: Annice Needy, MD;  Location: [facility] INVASIVE CV LAB;  Service: Cardiovascular;  Laterality: N/A;   EPIRETINAL MEMBRANE REMOVED Bilateral    ESOPHAGOGASTRODUODENOSCOPY  08/21/2012   L1-L2 KYPHOPLASTY N/A    POSTERIOR TIBIAL TENDON REPAIR Left    SKIN BIOPSY     SVT ABLATION N/A 2007   TEMPORAL ARTERY BIOPSY / LIGATION     TOTAL ABDOMINAL HYSTERECTOMY W/ BILATERAL SALPINGOOPHORECTOMY  1985   TOTAL KNEE ARTHROPLASTY Left 01/10/2012   TOTAL KNEE ARTHROPLASTY Right 12/20/2022   Procedure: TOTAL KNEE ARTHROPLASTY;  Surgeon: Reinaldo Berber, MD;  Location: [facility] ORS;  Service: Orthopedics;  Laterality: Right;   TUBAL LIGATION      Home Medications:  Allergies as of 04/05/2023       Reactions   Diltiazem Hcl Hives   Cardizem [diltiazem] Hives   Statins Other (See Comments)   Unknown reaction   Aloe Hives, Rash   Doxycycline Hives, Other (See Comments)   RASH - HAND ITCHING   Nitrofurantoin Hives, Rash   08-11-12 per pt caused Esophagitis and Urticaria   Penicillins Hives, Rash   Sulfamethoxazole-trimethoprim Nausea Only        Medication List        Accurate as of April 05, 2023  3:06 PM. If you have any questions, ask your nurse or doctor.          acetaminophen 500 MG tablet Commonly known as: TYLENOL Take 1 tablet (500 mg total) by mouth every 6 (six) hours as needed.   ALIGN PREBIOTIC-PROBIOTIC PO Take 2 tablets by mouth daily.   Calcium-Magnesium-Zinc 333-133-5 MG Tabs Take 1 tablet by mouth daily.   carbidopa-levodopa 25-100 MG tablet Commonly known as: SINEMET IR Take 1 tablet by mouth 4 (four) times daily.   cetirizine 10 MG tablet Commonly known as: ZYRTEC Take 10 mg by mouth at bedtime.   conjugated estrogens 0.625 MG/GM vaginal cream Commonly known as: PREMARIN Place 1 g vaginally 3 (three) times a week. 1 drop on the urethral for urinary  incontinence M-W-F   cycloSPORINE 0.05 % ophthalmic emulsion Commonly known as: RESTASIS Place 1 drop into both eyes 2 (two) times daily.   docusate sodium 100 MG capsule Commonly known as: COLACE Take 1 capsule (100 mg total) by mouth 2 (two) times daily.   Fish Oil 1200 MG Caps Take 1 capsule by mouth daily.   fluticasone 50 MCG/ACT nasal spray Commonly known as: FLONASE Place 1 spray into both nostrils 2 (two) times daily.   levothyroxine 50 MCG tablet Commonly known as: SYNTHROID Take 50 mcg by mouth daily before breakfast.   metoprolol succinate 25 MG 24 hr tablet Commonly known as: TOPROL-XL Take 12.5 mg by mouth daily.   midodrine 2.5 MG tablet Commonly known as: PROAMATINE Take 1 tablet (2.5 mg total) by mouth 3 (three) times daily as needed (  for top number for blood pressure lower than 90).   PARoxetine 10 MG tablet Commonly known as: PAXIL Take 10 mg by mouth at bedtime.   Polyethyl Glycol-Propyl Glycol 0.4-0.3 % Soln Place 1 drop into both eyes 2 (two) times daily.   polyethylene glycol powder 17 GM/SCOOP powder Commonly known as: GLYCOLAX/MIRALAX Take 8.5 g by mouth daily. 1/2 scoop as needed   PreserVision AREDS 2 Caps Take 1 capsule by mouth 2 (two) times daily.   CENTRUM SILVER 50+WOMEN PO Take 1 tablet by mouth daily.   Prevagen 10 MG Caps Generic drug: Apoaequorin Take 1 capsule by mouth daily.   Vitamin D 125 MCG (5000 UT) Caps Take 1 capsule by mouth daily.   Voltaren Arthritis Pain 1 % Gel Generic drug: diclofenac Sodium Apply 2 g topically daily.        Allergies:  Allergies  Allergen Reactions   Diltiazem Hcl Hives   Cardizem [Diltiazem] Hives   Statins Other (See Comments)    Unknown reaction   Aloe Hives and Rash   Doxycycline Hives and Other (See Comments)    RASH - HAND ITCHING   Nitrofurantoin Hives and Rash    08-11-12 per pt caused Esophagitis and Urticaria   Penicillins Hives and Rash    Sulfamethoxazole-Trimethoprim Nausea Only    Family History: Family History  Problem Relation Age of Onset   Breast cancer Neg Hx     Social History:  reports that she has never smoked. She has never used smokeless tobacco. She reports that she does not drink alcohol and does not use drugs.  ROS: Pertinent ROS in HPI  Physical Exam: BP (!) 166/82   Pulse 62   Constitutional:  Well nourished. Alert and oriented, No acute distress. HEENT: Walker AT, moist mucus membranes.  Trachea midline Cardiovascular: No clubbing, cyanosis, or edema. Respiratory: Normal respiratory effort, no increased work of breathing. Neurologic: Grossly intact, no focal deficits, moving all 4 extremities. Psychiatric: Normal mood and affect.  Laboratory Data: Comprehensive Metabolic Panel (CMP) Order: [id number] Component Ref Range & Units 13 d ago  Glucose 70 - 110 mg/dL 78  Sodium 841 - 324 mmol/L 141  Potassium 3.6 - 5.1 mmol/L 4.6  Chloride 97 - 109 mmol/L 103  Carbon Dioxide (CO2) 22.0 - 32.0 mmol/L 32.9 High   Urea Nitrogen (BUN) 7 - 25 mg/dL 20  Creatinine 0.6 - 1.1 mg/dL 0.9  Glomerular Filtration Rate (eGFR) >60 mL/min/1.73sq m 62  Comment: CKD-EPI (2021) does not include patient's race in the calculation of eGFR.  Monitoring changes of plasma creatinine and eGFR over time is useful for monitoring kidney function.  Interpretive Ranges for eGFR (CKD-EPI 2021):  eGFR:       >60 mL/min/1.73 sq. m - Normal eGFR:       30-59 mL/min/1.73 sq. m - Moderately Decreased eGFR:       15-29 mL/min/1.73 sq. m  - Severely Decreased eGFR:       < 15 mL/min/1.73 sq. m  - Kidney Failure   Note: These eGFR calculations do not apply in acute situations when eGFR is changing rapidly or patients on dialysis.  Calcium 8.7 - 10.3 mg/dL 9.9  AST 8 - 39 U/L 17  ALT 5 - 38 U/L 8  Alk Phos (alkaline Phosphatase) 34 - 104 U/L 77  Albumin 3.5 - 4.8 g/dL 3.9  Bilirubin, Total 0.3 - 1.2 mg/dL 0.4   Protein, Total 6.1 - 7.9 g/dL 6.9  A/G Ratio 1.0 - 5.0  gm/dL 1.3  Resulting Agency [facility] - LAB   Specimen Collected: 03/22/23 09:19   Performed by: [facility] - [facility] Last Resulted: 03/22/23 14:21  Received From: [facility]  Result Received: 04/01/23 11:47    CBC w/auto Differential (5 Part) Order: [id number] Component Ref Range & Units 13 d ago  WBC (White Blood Cell Count) 4.1 - 10.2 10^3/uL 5.8  RBC (Red Blood Cell Count) 4.04 - 5.48 10^6/uL 4.49  Hemoglobin 12.0 - 15.0 gm/dL 40.9  Hematocrit 81.1 - 47.0 % 42  MCV (Mean Corpuscular Volume) 80.0 - 100.0 fl 93.5  MCH (Mean Corpuscular Hemoglobin) 27.0 - 31.2 pg 29.6  MCHC (Mean Corpuscular Hemoglobin Concentration) 32.0 - 36.0 gm/dL 91.4 Low   Platelet Count 150 - 450 10^3/uL 263  RDW-CV (Red Cell Distribution Width) 11.6 - 14.8 % 14.8  MPV (Mean Platelet Volume) 9.4 - 12.4 fl 9 Low   Neutrophils 1.50 - 7.80 10^3/uL 3.09  Lymphocytes 1.00 - 3.60 10^3/uL 1.88  Monocytes 0.00 - 1.50 10^3/uL 0.58  Eosinophils 0.00 - 0.55 10^3/uL 0.21  Basophils 0.00 - 0.09 10^3/uL 0.06  Neutrophil % 32.0 - 70.0 % 53.1  Lymphocyte % 10.0 - 50.0 % 32.2  Monocyte % 4.0 - 13.0 % 9.9  Eosinophil % 1.0 - 5.0 % 3.6  Basophil% 0.0 - 2.0 % 1  Immature Granulocyte % <=0.7 % 0.2  Immature Granulocyte Count <=0.06 10^3/L 0.01  Resulting Agency [facility] - LAB   Specimen Collected: 03/22/23 09:19   Performed by: [facility] Last Resulted: 03/22/23 10:03  Received From: [facility]  Result Received: 04/01/23 11:47     Urinalysis Urinalysis w/Microscopic Order: [id number] Component Ref Range & Units 13 d ago  Color Colorless, Straw, Light Yellow, Yellow, Dark Yellow Light Yellow  Clarity Clear Clear  Specific Gravity 1.005 - 1.030 1.016  pH, Urine 5.0 - 8.0 7  Protein, Urinalysis Negative mg/dL Negative  Glucose, Urinalysis Negative  mg/dL Negative  Ketones, Urinalysis Negative mg/dL Negative  Blood, Urinalysis Negative Negative  Nitrite, Urinalysis Negative Negative  Leukocyte Esterase, Urinalysis Negative Negative  Hyaline Casts, Urinalysis /lpf 4  Bilirubin, Urinalysis Negative Negative  Urobilinogen, Urinalysis 0.2 - 1.0 mg/dL 0.2  WBC, UA <=5 /hpf 1  Red Blood Cells, Urinalysis <=3 /hpf 0  Bacteria, Urinalysis 0 - 5 /hpf 0-5  Squamous Epithelial Cells, Urinalysis /hpf 4  Resulting Agency [facility]   Specimen Collected: 03/22/23 09:19   Performed by: [facility] Last Resulted: 03/22/23 10:08  Received From: [facility]  Result Received: 04/01/23 11:47  I have reviewed the labs.   Pertinent Imaging: N/A  Assessment & Plan:    1. Microscopic hematuria -recent UA at PCP's (03/2023) negative for micro heme  2. rUTI's -asymptomatic at this visit -encourage continual use of vaginal estrogen cream, cranberry tablets, D-mannose and increase fluids  -I asked her to contact us when she has feelings of UTI so that we can see her and treat her appropriately  3. GSM -Patient will continue the vaginal estrogen cream, applying it 3 nights weekly   Return in about 1 year (around 04/04/2024) for PVR and OAB questionnaire.  These notes generated with voice recognition software. I apologize for typographical errors.  Cloretta Ned  [facility] [address]  [address] [place], [place] [address] [phone]

## 2023-04-05 ENCOUNTER — Encounter: Payer: Self-pay | Admitting: Urology

## 2023-04-05 ENCOUNTER — Ambulatory Visit (INDEPENDENT_AMBULATORY_CARE_PROVIDER_SITE_OTHER): Payer: Medicare Other | Admitting: Urology

## 2023-04-05 VITALS — BP 166/82 | HR 62

## 2023-04-05 DIAGNOSIS — N390 Urinary tract infection, site not specified: Secondary | ICD-10-CM

## 2023-04-05 DIAGNOSIS — Z87440 Personal history of urinary (tract) infections: Secondary | ICD-10-CM

## 2023-04-05 DIAGNOSIS — N952 Postmenopausal atrophic vaginitis: Secondary | ICD-10-CM | POA: Diagnosis not present

## 2023-04-05 DIAGNOSIS — R3129 Other microscopic hematuria: Secondary | ICD-10-CM | POA: Diagnosis not present

## 2023-04-28 ENCOUNTER — Encounter: Payer: Self-pay | Admitting: Gastroenterology

## 2023-05-05 ENCOUNTER — Encounter: Payer: Self-pay | Admitting: Gastroenterology

## 2023-05-05 ENCOUNTER — Encounter: Payer: Self-pay | Admitting: Anesthesiology

## 2023-05-05 ENCOUNTER — Ambulatory Visit: Admission: RE | Admit: 2023-05-05 | Payer: Medicare Other | Source: Home / Self Care | Admitting: Gastroenterology

## 2023-05-05 SURGERY — ESOPHAGOGASTRODUODENOSCOPY (EGD) WITH PROPOFOL
Anesthesia: General

## 2023-07-14 ENCOUNTER — Ambulatory Visit
Admission: RE | Admit: 2023-07-14 | Discharge: 2023-07-14 | Disposition: A | Discharge status: Home / Self Care | Payer: Medicare Other | Attending: Gastroenterology | Admitting: Gastroenterology

## 2023-07-14 ENCOUNTER — Encounter: Payer: Self-pay | Admitting: Gastroenterology

## 2023-07-14 ENCOUNTER — Encounter
Admission: RE | Disposition: A | Discharge status: Home / Self Care | Payer: Self-pay | Source: Home / Self Care | Attending: Gastroenterology

## 2023-07-14 ENCOUNTER — Ambulatory Visit: Payer: Medicare Other | Admitting: Anesthesiology

## 2023-07-14 DIAGNOSIS — K224 Dyskinesia of esophagus: Secondary | ICD-10-CM | POA: Insufficient documentation

## 2023-07-14 DIAGNOSIS — F32A Depression, unspecified: Secondary | ICD-10-CM | POA: Diagnosis not present

## 2023-07-14 DIAGNOSIS — I2510 Atherosclerotic heart disease of native coronary artery without angina pectoris: Secondary | ICD-10-CM | POA: Diagnosis not present

## 2023-07-14 DIAGNOSIS — M1990 Unspecified osteoarthritis, unspecified site: Secondary | ICD-10-CM | POA: Insufficient documentation

## 2023-07-14 DIAGNOSIS — I252 Old myocardial infarction: Secondary | ICD-10-CM | POA: Diagnosis not present

## 2023-07-14 DIAGNOSIS — I10 Essential (primary) hypertension: Secondary | ICD-10-CM | POA: Insufficient documentation

## 2023-07-14 DIAGNOSIS — I7300 Raynaud's syndrome without gangrene: Secondary | ICD-10-CM | POA: Insufficient documentation

## 2023-07-14 DIAGNOSIS — K222 Esophageal obstruction: Secondary | ICD-10-CM | POA: Diagnosis not present

## 2023-07-14 DIAGNOSIS — K219 Gastro-esophageal reflux disease without esophagitis: Secondary | ICD-10-CM | POA: Diagnosis not present

## 2023-07-14 DIAGNOSIS — R1310 Dysphagia, unspecified: Secondary | ICD-10-CM | POA: Diagnosis present

## 2023-07-14 HISTORY — PX: ESOPHAGOGASTRODUODENOSCOPY: SHX5428

## 2023-07-14 SURGERY — EGD (ESOPHAGOGASTRODUODENOSCOPY)
Anesthesia: General

## 2023-07-14 MED ORDER — GLYCOPYRROLATE 0.2 MG/ML IJ SOLN
INTRAMUSCULAR | Status: DC | PRN
  Administered 2023-07-14: .2 mg via INTRAVENOUS

## 2023-07-14 MED ORDER — LIDOCAINE HCL (PF) 2 % IJ SOLN
INTRAMUSCULAR | Status: AC
  Filled 2023-07-14: qty 5

## 2023-07-14 MED ORDER — PROPOFOL 10 MG/ML IV BOLUS
INTRAVENOUS | Status: DC | PRN
  Administered 2023-07-14 (×2): 50 mg via INTRAVENOUS

## 2023-07-14 MED ORDER — PROPOFOL 10 MG/ML IV BOLUS
INTRAVENOUS | Status: AC
  Filled 2023-07-14: qty 20

## 2023-07-14 MED ORDER — GLYCOPYRROLATE 0.2 MG/ML IJ SOLN
INTRAMUSCULAR | Status: AC
  Filled 2023-07-14: qty 1

## 2023-07-14 MED ORDER — LIDOCAINE HCL (CARDIAC) PF 100 MG/5ML IV SOSY
PREFILLED_SYRINGE | INTRAVENOUS | Status: DC | PRN
  Administered 2023-07-14: 100 mg via INTRAVENOUS

## 2023-07-14 MED ORDER — DEXMEDETOMIDINE HCL IN NACL 80 MCG/20ML IV SOLN
INTRAVENOUS | Status: DC | PRN
  Administered 2023-07-14: 4 ug via INTRAVENOUS

## 2023-07-14 MED ORDER — SODIUM CHLORIDE 0.9 % IV SOLN: INTRAVENOUS | Status: DC

## 2023-07-14 NOTE — Anesthesia Postprocedure Evaluation (Signed)
 Anesthesia Post Note  Patient: Danielys Madry Heiman  Procedure(s) Performed: ESOPHAGOGASTRODUODENOSCOPY (EGD) Balloon dilation wire-guided  Patient location during evaluation: PACU Anesthesia Type: General Level of consciousness: awake and awake and alert Pain management: satisfactory to patient Vital Signs Assessment: post-procedure vital signs reviewed and stable Respiratory status: spontaneous breathing and nonlabored ventilation Cardiovascular status: stable Anesthetic complications: no   No notable events documented.   Last Vitals:  Vitals:   07/14/23 1118 07/14/23 1133  BP: 129/67 (!) 141/73  Pulse: 76 (!) 29  Resp: (!) 24 16  Temp:    SpO2: 99% 100%    Last Pain:  Vitals:   07/14/23 1133  TempSrc:   PainSc: 0-No pain                 VAN STAVEREN,Kelso Bibby

## 2023-07-14 NOTE — Transfer of Care (Signed)
 Immediate Anesthesia Transfer of Care Note  Patient: Tracy Long  Procedure(s) Performed: ESOPHAGOGASTRODUODENOSCOPY (EGD) Balloon dilation wire-guided  Patient Location: PACU and Endoscopy Unit  Anesthesia Type:General  Level of Consciousness: drowsy and responds to stimulation  Airway & Oxygen Therapy: Patient Spontanous Breathing  Post-op Assessment: Report given to RN  Post vital signs: Reviewed and stable  Last Vitals:  Vitals Value Taken Time  BP 149/79 07/14/23 1107  Temp 97   Pulse 77 07/14/23 1107  Resp 18 07/14/23 1107  SpO2 99 % 07/14/23 1107  Vitals shown include unfiled device data.  Last Pain:  Vitals:   07/14/23 1025  TempSrc: Temporal  PainSc: 0-No pain         Complications: No notable events documented.

## 2023-07-14 NOTE — Anesthesia Preprocedure Evaluation (Signed)
 Anesthesia Evaluation  Patient identified by MRN, date of birth, ID band Patient awake    Reviewed: Allergy & Precautions, NPO status , Patient's Chart, lab work & pertinent test results  Airway Mallampati: III  TM Distance: <3 FB Neck ROM: full    Dental  (+) Teeth Intact   Pulmonary neg pulmonary ROS   Pulmonary exam normal        Cardiovascular Exercise Tolerance: Good hypertension, Pt. on medications + CAD, + Past MI and + Peripheral Vascular Disease  negative cardio ROS Normal cardiovascular exam Rhythm:Regular Rate:Normal     Neuro/Psych  PSYCHIATRIC DISORDERS  Depression    negative neurological ROS  negative psych ROS   GI/Hepatic negative GI ROS, Neg liver ROS,GERD  Medicated,,  Endo/Other  negative endocrine ROS    Renal/GU negative Renal ROS  negative genitourinary   Musculoskeletal  (+) Arthritis ,    Abdominal Normal abdominal exam  (+)   Peds negative pediatric ROS (+)  Hematology negative hematology ROS (+) Blood dyscrasia, anemia   Anesthesia Other Findings Past Medical History: No date: Arthritis No date: Chronically dry eyes     Comment:  a.) on cyclosporine gtts No date: Compression fracture of lumbar vertebra (HCC)     Comment:  a.) s/p L1-L2 kyphoplasty No date: Coronary artery disease involving native coronary artery of  native heart without angina pectoris     Comment:  a.) s/p MI 1982; b.) s/p type II NSTEMI secondary to               AVRT 08/31/2005: LHC 09/01/2005 - normal coronaries; c.)               MV 11/03/2016: no ischemia No date: Depression No date: Diverticulosis No date: GERD (gastroesophageal reflux disease) No date: H/O bladder infections 2003: History of bilateral cataract extraction 2006: History of right breast cancer     Comment:  a.) CNB (+) ADH with suspicion of DCIS --> s/p partial               mastectomy 05/06/2001 (DCIS) No date: HLD (hyperlipidemia) No  date: Hypertension No date: Hypothyroidism No date: Inverted nipple No date: Iron deficiency 09/13/2022: Lower GI bleed     Comment:  a.) Tx'd by vascular 09/14/2022 --> s/p microbead               embolization of LEFT colonic branch of IMA and splenic               branch of SMA + coil embolization with 2 mm Ruby coils to              splenic flexure branch off of LEFT colonic branch of IMA No date: MCI (mild cognitive impairment)     Comment:  a.) takes apoequorin 1982: Myocardial infarction (HCC) No date: Osteopenia No date: Palpitations No date: Parkinson disease (HCC) No date: Polymyalgia rheumatica (HCC) 2013: Postoperative pulmonary embolism (HCC)     Comment:  a.) s/p LEFT TKA No date: PVD (peripheral vascular disease) (HCC)     Comment:  a.) s/p BILATERAL femoral stent placement in 2000 No date: Raynaud's disease No date: Rheumatoid arthritis (HCC) No date: Supraventricular tachycardia (HCC)     Comment:  a.) s/p ablation for AVRT 2007; b.) rate/rhythm               currently controlled with oral metoprolol succinate 08/31/2005: Type 2 myocardial infarction [facility])     Comment:  a.) Type II NSTEMI  r/t demand ischemia secondary to               SVT/AVRT; b.) LHC 09/01/2005 - normal coronaries; no sig               CAD.  Past Surgical History: 2000: BILATERAL FEMORAL STENTS; Bilateral No date: BREAST EXCISIONAL BIOPSY; Bilateral     Comment:  [facility] (BENIGN MASS) 04/2005: BREAST LUMPECTOMY; Right 09/01/2005: CARDIAC CATHETERIZATION 2003: CATARACT EXTRACTION W/ INTRAOCULAR LENS  IMPLANT, BILATERAL;  Bilateral 09/14/2022: COLONOSCOPY WITH PROPOFOL; N/A     Comment:  Procedure: COLONOSCOPY WITH PROPOFOL;  Surgeon: Jaynie Collins, DO;  Location: [facility] ENDOSCOPY;  Service:               Gastroenterology;  Laterality: N/A; 09/14/2022: EMBOLIZATION (CATH LAB); N/A     Comment:  Procedure: EMBOLIZATION;  Surgeon: Annice Needy, MD;                Location:  [facility] INVASIVE CV LAB;  Service: Cardiovascular;              Laterality: N/A; No date: EPIRETINAL MEMBRANE REMOVED; Bilateral 08/21/2012: ESOPHAGOGASTRODUODENOSCOPY No date: L1-L2 KYPHOPLASTY; N/A No date: POSTERIOR TIBIAL TENDON REPAIR; Left No date: SKIN BIOPSY 2007: SVT ABLATION; N/A No date: TEMPORAL ARTERY BIOPSY / LIGATION 1985: TOTAL ABDOMINAL HYSTERECTOMY W/ BILATERAL SALPINGOOPHORECTOMY 01/10/2012: TOTAL KNEE ARTHROPLASTY; Left 12/20/2022: TOTAL KNEE ARTHROPLASTY; Right     Comment:  Procedure: TOTAL KNEE ARTHROPLASTY;  Surgeon: Reinaldo Berber, MD;  Location: [facility] ORS;  Service: Orthopedics;               Laterality: Right; No date: TUBAL LIGATION  BMI    Body Mass Index: 21.80 kg/m      Reproductive/Obstetrics negative OB ROS                             Anesthesia Physical Anesthesia Plan  ASA: 3  Anesthesia Plan: General   Post-op Pain Management:    Induction: Intravenous  PONV Risk Score and Plan: Propofol infusion and TIVA  Airway Management Planned: Natural Airway and Nasal Cannula  Additional Equipment:   Intra-op Plan:   Post-operative Plan:   Informed Consent: I have reviewed the patients History and Physical, chart, labs and discussed the procedure including the risks, benefits and alternatives for the proposed anesthesia with the patient or authorized representative who has indicated his/her understanding and acceptance.     Dental Advisory Given  Plan Discussed with: CRNA  Anesthesia Plan Comments:        Anesthesia Quick Evaluation

## 2023-07-14 NOTE — Interval H&P Note (Signed)
 History and Physical Interval Note: Preprocedure H&P from 07/14/23  was reviewed and there was no interval change after seeing and examining the patient.  Written consent was obtained from the patient after discussion of risks, benefits, and alternatives. Patient has consented to proceed with Esophagogastroduodenoscopy with possible intervention   07/14/2023 10:46 AM  Tracy Long  has presented today for surgery, with the diagnosis of Esophageal dysphagia (R13.19).  The various methods of treatment have been discussed with the patient and family. After consideration of risks, benefits and other options for treatment, the patient has consented to  Procedure(s): ESOPHAGOGASTRODUODENOSCOPY (EGD) (N/A) as a surgical intervention.  The patient's history has been reviewed, patient examined, no change in status, stable for surgery.  I have reviewed the patient's chart and labs.  Questions were answered to the patient's satisfaction.     Jaynie Collins

## 2023-07-14 NOTE — H&P (Signed)
 Pre-Procedure H&P   Patient ID: Tracy Long is a 89 y.o. female.  Gastroenterology Provider: Jaynie Collins, DO   PCP: Gracelyn Nurse, MD  Date: 07/14/2023  HPI Ms. Tracy Long is a 89 y.o. female who presents today for Esophagogastroduodenoscopy for Dysphagia .  Notes trouble swallowing solids and breads.  She has gone on to occasionally develop issues with pills. No odynophagia weight changes or appetite changes.  No issues with liquids. Last underwent EGD in 2014 which was normal  113.3 MCV 93.5 platelets 263,000 creatinine 0.9   Past Medical History:  Diagnosis Date   Arthritis    Chronically dry eyes    a.) on cyclosporine gtts   Compression fracture of lumbar vertebra (HCC)    a.) s/p L1-L2 kyphoplasty   Coronary artery disease involving native coronary artery of native heart without angina pectoris    a.) s/p MI 1982; b.) s/p type II NSTEMI secondary to AVRT 08/31/2005: LHC 09/01/2005 - normal coronaries; c.) MV 11/03/2016: no ischemia   Depression    Diverticulosis    GERD (gastroesophageal reflux disease)    H/O bladder infections    History of bilateral cataract extraction 2003   History of right breast cancer 2006   a.) CNB (+) ADH with suspicion of DCIS --> s/p partial mastectomy 05/06/2001 (DCIS)   HLD (hyperlipidemia)    Hypertension    Hypothyroidism    Inverted nipple    Iron deficiency    Lower GI bleed 09/13/2022   a.) Tx'd by vascular 09/14/2022 --> s/p microbead embolization of LEFT colonic branch of IMA and splenic branch of SMA + coil embolization with 2 mm Ruby coils to splenic flexure branch off of LEFT colonic branch of IMA   MCI (mild cognitive impairment)    a.) takes apoequorin   Myocardial infarction (HCC) 1982   Osteopenia    Palpitations    Parkinson disease (HCC)    Polymyalgia rheumatica (HCC)    Postoperative pulmonary embolism (HCC) 2013   a.) s/p LEFT TKA   PVD (peripheral vascular disease) (HCC)     a.) s/p BILATERAL femoral stent placement in 2000   Raynaud's disease    Rheumatoid arthritis (HCC)    Supraventricular tachycardia (HCC)    a.) s/p ablation for AVRT 2007; b.) rate/rhythm currently controlled with oral metoprolol succinate   Type 2 myocardial infarction (HCC) 08/31/2005   a.) Type II NSTEMI r/t demand ischemia secondary to SVT/AVRT; b.) LHC 09/01/2005 - normal coronaries; no sig CAD.    Past Surgical History:  Procedure Laterality Date   BILATERAL FEMORAL STENTS Bilateral 2000   BREAST EXCISIONAL BIOPSY Bilateral    [facility] (BENIGN MASS)   BREAST LUMPECTOMY Right 04/2005   CARDIAC CATHETERIZATION  09/01/2005   CATARACT EXTRACTION W/ INTRAOCULAR LENS  IMPLANT, BILATERAL Bilateral 2003   COLONOSCOPY WITH PROPOFOL N/A 09/14/2022   Procedure: COLONOSCOPY WITH PROPOFOL;  Surgeon: Jaynie Collins, DO;  Location: [facility] ENDOSCOPY;  Service: Gastroenterology;  Laterality: N/A;   EMBOLIZATION (CATH LAB) N/A 09/14/2022   Procedure: EMBOLIZATION;  Surgeon: Annice Needy, MD;  Location: [facility] INVASIVE CV LAB;  Service: Cardiovascular;  Laterality: N/A;   EPIRETINAL MEMBRANE REMOVED Bilateral    ESOPHAGOGASTRODUODENOSCOPY  08/21/2012   L1-L2 KYPHOPLASTY N/A    POSTERIOR TIBIAL TENDON REPAIR Left    SKIN BIOPSY     SVT ABLATION N/A 2007   TEMPORAL ARTERY BIOPSY / LIGATION     TOTAL ABDOMINAL HYSTERECTOMY W/ BILATERAL SALPINGOOPHORECTOMY  1985  TOTAL KNEE ARTHROPLASTY Left 01/10/2012   TOTAL KNEE ARTHROPLASTY Right 12/20/2022   Procedure: TOTAL KNEE ARTHROPLASTY;  Surgeon: Reinaldo Berber, MD;  Location: [facility] ORS;  Service: Orthopedics;  Laterality: Right;   TUBAL LIGATION      Family History No h/o GI disease or malignancy  Review of Systems  Constitutional:  Negative for activity change, appetite change, chills, diaphoresis, fatigue, fever and unexpected weight change.  HENT:  Positive for trouble swallowing. Negative for voice change.   Respiratory:  Negative for  shortness of breath and wheezing.   Cardiovascular:  Negative for chest pain, palpitations and leg swelling.  Gastrointestinal:  Negative for abdominal distention, abdominal pain, anal bleeding, blood in stool, constipation, diarrhea, nausea, rectal pain and vomiting.  Musculoskeletal:  Negative for arthralgias and myalgias.  Skin:  Negative for color change and pallor.  Neurological:  Negative for dizziness, syncope and weakness.  Psychiatric/Behavioral:  Negative for confusion.   All other systems reviewed and are negative.    Medications No current facility-administered medications on file prior to encounter.   Current Outpatient Medications on File Prior to Encounter  Medication Sig Dispense Refill   carbidopa-levodopa (SINEMET IR) 25-100 MG tablet Take 1 tablet by mouth 4 (four) times daily.     levothyroxine (SYNTHROID) 50 MCG tablet Take 50 mcg by mouth daily before breakfast.     metoprolol succinate (TOPROL-XL) 25 MG 24 hr tablet Take 12.5 mg by mouth daily.     PARoxetine (PAXIL) 10 MG tablet Take 10 mg by mouth at bedtime.     acetaminophen (TYLENOL) 500 MG tablet Take 1 tablet (500 mg total) by mouth every 6 (six) hours as needed. 30 tablet 0   Apoaequorin (PREVAGEN) 10 MG CAPS Take 1 capsule by mouth daily.     Bacillus Coagulans-Inulin (ALIGN PREBIOTIC-PROBIOTIC PO) Take 2 tablets by mouth daily.     Calcium-Magnesium-Zinc 333-133-5 MG TABS Take 1 tablet by mouth daily.     cetirizine (ZYRTEC) 10 MG tablet Take 10 mg by mouth at bedtime.     Cholecalciferol (VITAMIN D) 125 MCG (5000 UT) CAPS Take 1 capsule by mouth daily.     conjugated estrogens (PREMARIN) vaginal cream Place 1 g vaginally 3 (three) times a week. 1 drop on the urethral for urinary incontinence M-W-F     cycloSPORINE (RESTASIS) 0.05 % ophthalmic emulsion Place 1 drop into both eyes 2 (two) times daily.     diclofenac Sodium (VOLTAREN ARTHRITIS PAIN) 1 % GEL Apply 2 g topically daily.     docusate sodium  (COLACE) 100 MG capsule Take 1 capsule (100 mg total) by mouth 2 (two) times daily. 10 capsule 0   fluticasone (FLONASE) 50 MCG/ACT nasal spray Place 1 spray into both nostrils 2 (two) times daily.     midodrine (PROAMATINE) 2.5 MG tablet Take 1 tablet (2.5 mg total) by mouth 3 (three) times daily as needed (for top number for blood pressure lower than 90).     Multiple Vitamins-Minerals (CENTRUM SILVER 50+WOMEN PO) Take 1 tablet by mouth daily.     Multiple Vitamins-Minerals (PRESERVISION AREDS 2) CAPS Take 1 capsule by mouth 2 (two) times daily.     Omega-3 Fatty Acids (FISH OIL) 1200 MG CAPS Take 1 capsule by mouth daily.     Polyethyl Glycol-Propyl Glycol 0.4-0.3 % SOLN Place 1 drop into both eyes 2 (two) times daily.     polyethylene glycol powder (GLYCOLAX/MIRALAX) 17 GM/SCOOP powder Take 8.5 g by mouth daily. 1/2 scoop as needed  Pertinent medications related to GI and procedure were reviewed by me with the patient prior to the procedure   Current Facility-Administered Medications:    0.9 %  sodium chloride infusion, , Intravenous, Continuous, Jaynie Collins, DO, Last Rate: 20 mL/hr at 07/14/23 1038, Continued from Pre-op at 07/14/23 1038  sodium chloride 20 mL/hr at 07/14/23 1038       Allergies  Allergen Reactions   Diltiazem Hcl Hives   Cardizem [Diltiazem] Hives   Statins Other (See Comments)    Unknown reaction   Aloe Hives and Rash   Doxycycline Hives and Other (See Comments)    RASH - HAND ITCHING   Nitrofurantoin Hives and Rash    08-11-12 per pt caused Esophagitis and Urticaria   Penicillins Hives and Rash   Sulfamethoxazole-Trimethoprim Nausea Only   Allergies were reviewed by me prior to the procedure  Objective   Body mass index is 21.8 kg/m. Vitals:   07/14/23 1004 07/14/23 1025  BP:  (!) 147/76  Pulse:  65  Resp:  18  Temp:  (!) 96.8 F (36 C)  TempSrc:  Temporal  SpO2:  99%  Weight: 59.4 kg   Height: 5\' 5"  (1.651 m)      Physical  Exam Vitals and nursing note reviewed.  Constitutional:      General: She is not in acute distress.    Appearance: Normal appearance. She is not ill-appearing, toxic-appearing or diaphoretic.  HENT:     Head: Normocephalic and atraumatic.     Nose: Nose normal.     Mouth/Throat:     Mouth: Mucous membranes are moist.     Pharynx: Oropharynx is clear.  Eyes:     General: No scleral icterus.    Extraocular Movements: Extraocular movements intact.  Cardiovascular:     Rate and Rhythm: Normal rate and regular rhythm.     Heart sounds: Normal heart sounds. No murmur heard.    No friction rub. No gallop.  Pulmonary:     Effort: Pulmonary effort is normal. No respiratory distress.     Breath sounds: Normal breath sounds. No wheezing, rhonchi or rales.  Abdominal:     General: Bowel sounds are normal. There is no distension.     Palpations: Abdomen is soft.     Tenderness: There is no abdominal tenderness. There is no guarding or rebound.  Musculoskeletal:     Cervical back: Neck supple.     Right lower leg: No edema.     Left lower leg: No edema.  Skin:    General: Skin is warm and dry.     Coloration: Skin is not jaundiced or pale.  Neurological:     General: No focal deficit present.     Mental Status: She is alert and oriented to person, place, and time. Mental status is at baseline.  Psychiatric:        Mood and Affect: Mood normal.        Behavior: Behavior normal.        Thought Content: Thought content normal.        Judgment: Judgment normal.      Assessment:  Ms. Rivers Gassmann is a 89 y.o. female  who presents today for Esophagogastroduodenoscopy for Dysphagia .  Plan:  Esophagogastroduodenoscopy with possible intervention today  Esophagogastroduodenoscopy with possible biopsy, control of bleeding, polypectomy, and interventions as necessary has been discussed with the patient/patient representative. Informed consent was obtained from the patient/patient  representative after explaining the indication, nature,  and risks of the procedure including but not limited to death, bleeding, perforation, missed neoplasm/lesions, cardiorespiratory compromise, and reaction to medications. Opportunity for questions was given and appropriate answers were provided. Patient/patient representative has verbalized understanding is amenable to undergoing the procedure.   Jaynie Collins, DO  [facility] Gastroenterology  Portions of the record may have been created with voice recognition software. Occasional wrong-word or 'sound-a-like' substitutions may have occurred due to the inherent limitations of voice recognition software.  Read the chart carefully and recognize, using context, where substitutions may have occurred.

## 2023-07-14 NOTE — Op Note (Signed)
 [facility] Gastroenterology Patient Name: Tracy Long Procedure Date: 07/14/2023 10:35 AM MRN: [id number] Account #: [id number] Date of Birth: [birth year] Admit Type: Outpatient Age: 89 Room: [facility] ENDO ROOM 1 Gender: Female Note Status: Finalized Instrument Name: Upper Endoscope [id number] Procedure:             Upper GI endoscopy Indications:           Dysphagia Providers:             Jaynie Collins DO, DO Referring MD:          No Local Md, MD (Referring MD) Medicines:             Monitored Anesthesia Care Complications:         No immediate complications. Estimated blood loss:                         Minimal. Procedure:             Pre-Anesthesia Assessment:                        - Prior to the procedure, a History and Physical was                         performed, and patient medications and allergies were                         reviewed. The patient is competent. The risks and                         benefits of the procedure and the sedation options and                         risks were discussed with the patient. All questions                         were answered and informed consent was obtained.                         Patient identification and proposed procedure were                         verified by the physician, the nurse, the anesthetist                         and the technician in the endoscopy suite. Mental                         Status Examination: alert and oriented. Airway                         Examination: normal oropharyngeal airway and neck                         mobility. Respiratory Examination: clear to                         auscultation. CV Examination: RRR, no murmurs, no S3  or S4. Prophylactic Antibiotics: The patient does not                         require prophylactic antibiotics. Prior                         Anticoagulants: The patient has taken no anticoagulant                          or antiplatelet agents. ASA Grade Assessment: III - A                         patient with severe systemic disease. After reviewing                         the risks and benefits, the patient was deemed in                         satisfactory condition to undergo the procedure. The                         anesthesia plan was to use monitored anesthesia care                         (MAC). Immediately prior to administration of                         medications, the patient was re-assessed for adequacy                         to receive sedatives. The heart rate, respiratory                         rate, oxygen saturations, blood pressure, adequacy of                         pulmonary ventilation, and response to care were                         monitored throughout the procedure. The physical                         status of the patient was re-assessed after the                         procedure.                        After obtaining informed consent, the endoscope was                         passed under direct vision. Throughout the procedure,                         the patient's blood pressure, pulse, and oxygen                         saturations were monitored continuously. The Endoscope  was introduced through the mouth, and advanced to the                         second part of duodenum. The upper GI endoscopy was                         accomplished without difficulty. The patient tolerated                         the procedure well. Findings:      The duodenal bulb, first portion of the duodenum and second portion of       the duodenum were normal. Estimated blood loss: none.      The entire examined stomach was normal. Estimated blood loss: none.      Esophagogastric landmarks were identified: the gastroesophageal junction       was found at 38 cm from the incisors.      The Z-line was regular. Estimated blood loss: none.      One benign-appearing,  intrinsic moderate (circumferential scarring or       stenosis; an endoscope may pass) stenosis was found 38 cm from the       incisors. This stenosis measured 1.3 cm (inner diameter) x less than one       cm (in length). The stenosis was traversed. A TTS dilator was passed       through the scope. Dilation with a 15-16.5-18 mm balloon dilator was       performed to 15 mm. The dilation site was examined following endoscope       reinsertion and showed mild mucosal disruption. Small, superficial       disruption. Estimated blood loss was minimal.      Abnormal motility was noted in the esophagus. The cricopharyngeus was       abnormal. There is spasticity of the esophageal body. The distal       esophagus/lower esophageal sphincter is open. Increased tone to upper       esophageal sphincter, but scope passed easily and was widely patent upon       withdrawal without any signs of dilation Estimated blood loss: none.      The exam of the esophagus was otherwise normal.      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 Estimated blood loss: none. Impression:            - Normal duodenal bulb, first portion of the duodenum                         and second portion of the duodenum.                        - Normal stomach.                        - Esophagogastric landmarks identified.                        - Z-line regular.                        - Benign-appearing esophageal stenosis. Dilated.                        -  Abnormal esophageal motility, suspicious for                         esophageal spasm.                        - Ectopic gastric mucosa in the upper third of the                         esophagus.                        - No specimens collected. Recommendation:        - Patient has a contact number available for                         emergencies. The signs and symptoms of potential                         delayed complications  were discussed with the patient.                         Return to normal activities tomorrow. Written                         discharge instructions were provided to the patient.                        - Discharge patient to home.                        - Soft diet today.                        - No ibuprofen, naproxen, or other non-steroidal                         anti-inflammatory drugs for 5 days.                        - Continue present medications.                        - Repeat upper endoscopy at appointment to be                         scheduled for retreatment.                        - Return to GI office as previously scheduled.                        - The findings and recommendations were discussed with                         the patient.                        - The findings and recommendations were discussed with                         the patient's  family. Procedure Code(s):     --- Professional ---                        [phone], Esophagogastroduodenoscopy, flexible,                         transoral; with transendoscopic balloon dilation of                         esophagus (less than 30 mm diameter) Diagnosis Code(s):     --- Professional ---                        K22.2, Esophageal obstruction                        K22.4, Dyskinesia of esophagus                        Q40.2, Other specified congenital malformations of                         stomach                        R13.10, Dysphagia, unspecified CPT copyright 2022 [employer]. All rights reserved. The codes documented in this report are preliminary and upon coder review may  be revised to meet current compliance requirements. Attending Participation:      I personally performed the entire procedure. Elfredia Nevins, DO Jaynie Collins DO, DO 07/14/2023 11:09:34 AM This report has been signed electronically. Number of Addenda: 0 Note Initiated On: 07/14/2023 10:35 AM Estimated Blood  Loss:  Estimated blood loss was minimal.      [facility]

## 2023-07-15 ENCOUNTER — Encounter: Payer: Self-pay | Admitting: Gastroenterology

## 2023-09-02 ENCOUNTER — Encounter: Payer: Self-pay | Admitting: Emergency Medicine

## 2023-09-02 ENCOUNTER — Ambulatory Visit
Admission: EM | Admit: 2023-09-02 | Discharge: 2023-09-02 | Disposition: A | Discharge status: Home / Self Care | Attending: Family Medicine | Admitting: Family Medicine

## 2023-09-02 DIAGNOSIS — Z87440 Personal history of urinary (tract) infections: Secondary | ICD-10-CM | POA: Diagnosis not present

## 2023-09-02 DIAGNOSIS — N3001 Acute cystitis with hematuria: Secondary | ICD-10-CM | POA: Diagnosis present

## 2023-09-02 LAB — URINALYSIS, W/ REFLEX TO CULTURE (INFECTION SUSPECTED)
Bilirubin Urine: NEGATIVE
Glucose, UA: NEGATIVE mg/dL
Nitrite: NEGATIVE
Protein, ur: 100 mg/dL — AB
Specific Gravity, Urine: 1.02 (ref 1.005–1.030)
WBC, UA: >50 WBC/hpf (ref 0–5)
pH: 5.5 (ref 5.0–8.0)

## 2023-09-02 MED ORDER — CIPROFLOXACIN HCL 500 MG PO TABS: 500.0000 mg | ORAL_TABLET | Freq: Two times a day (BID) | ORAL | 0 refills | Status: DC

## 2023-09-02 NOTE — ED Provider Notes (Signed)
 [facility]    CSN: [id number] Arrival date & time: 09/02/23  1002      History   Chief Complaint Chief Complaint  Patient presents with   Dysuria     HPI HPI Tracy Long is a 89 y.o. female.    Tracy Long presents for dysuria and urinary frequency that started last night.  Tried nothing prior to arrival.  No LMP recorded. Patient has had a hysterectomy. No fever, chills, nausea, vomiting. Endorses abdominal pressure and lower back pain.   Follows with Dr Ledon Pry, urologist for history of recurrent UTIs.           Past Medical History:  Diagnosis Date   Arthritis    Chronically dry eyes    a.) on cyclosporine  gtts   Compression fracture of lumbar vertebra (HCC)    a.) s/p L1-L2 kyphoplasty   Coronary artery disease involving native coronary artery of native heart without angina pectoris    a.) s/p MI 1982; b.) s/p type II NSTEMI secondary to AVRT 08/31/2005: LHC 09/01/2005 - normal coronaries; c.) MV 11/03/2016: no ischemia   Depression    Diverticulosis    GERD (gastroesophageal reflux disease)    H/O bladder infections    History of bilateral cataract extraction 2003   History of right breast cancer 2006   a.) CNB (+) ADH with suspicion of DCIS --> s/p partial mastectomy 05/06/2001 (DCIS)   HLD (hyperlipidemia)    Hypertension    Hypothyroidism    Inverted nipple    Iron deficiency    Lower GI bleed 09/13/2022   a.) Tx'd by vascular 09/14/2022 --> s/p microbead embolization of LEFT colonic branch of IMA and splenic branch of SMA + coil embolization with 2 mm Ruby coils to splenic flexure branch off of LEFT colonic branch of IMA   MCI (mild cognitive impairment)    a.) takes apoequorin   Myocardial infarction (HCC) 1982   Osteopenia    Palpitations    Parkinson disease (HCC)    Polymyalgia rheumatica (HCC)    Postoperative pulmonary embolism (HCC) 2013   a.) s/p LEFT TKA   PVD (peripheral vascular disease) (HCC)    a.)  s/p BILATERAL femoral stent placement in 2000   Raynaud's disease    Rheumatoid arthritis (HCC)    Supraventricular tachycardia (HCC)    a.) s/p ablation for AVRT 2007; b.) rate/rhythm currently controlled with oral metoprolol  succinate   Type 2 myocardial infarction (HCC) 08/31/2005   a.) Type II NSTEMI r/t demand ischemia secondary to SVT/AVRT; b.) LHC 09/01/2005 - normal coronaries; no sig CAD.    Patient Active Problem List   Diagnosis Date Noted   S/P TKR (total knee replacement) using cement, right 12/20/2022   Mesenteric arterial embolization (HCC) 09/15/2022   Lower GI bleed 09/15/2022   Acute blood loss anemia 09/14/2022   Rectal bleeding 09/13/2022    Past Surgical History:  Procedure Laterality Date   BILATERAL FEMORAL STENTS Bilateral 2000   BREAST EXCISIONAL BIOPSY Bilateral    [facility] (BENIGN MASS)   BREAST LUMPECTOMY Right 04/2005   CARDIAC CATHETERIZATION  09/01/2005   CATARACT EXTRACTION W/ INTRAOCULAR LENS  IMPLANT, BILATERAL Bilateral 2003   COLONOSCOPY WITH PROPOFOL  N/A 09/14/2022   Procedure: COLONOSCOPY WITH PROPOFOL ;  Surgeon: Quintin Buckle, DO;  Location: [facility] ENDOSCOPY;  Service: Gastroenterology;  Laterality: N/A;   EMBOLIZATION (CATH LAB) N/A 09/14/2022   Procedure: EMBOLIZATION;  Surgeon: Celso College, MD;  Location: [facility] INVASIVE CV LAB;  Service: Cardiovascular;  Laterality: N/A;   EPIRETINAL MEMBRANE REMOVED Bilateral    ESOPHAGOGASTRODUODENOSCOPY  08/21/2012   ESOPHAGOGASTRODUODENOSCOPY N/A 07/14/2023   Procedure: ESOPHAGOGASTRODUODENOSCOPY (EGD);  Surgeon: Quintin Buckle, DO;  Location: [facility] ENDOSCOPY;  Service: Gastroenterology;  Laterality: N/A;   L1-L2 KYPHOPLASTY N/A    POSTERIOR TIBIAL TENDON REPAIR Left    SKIN BIOPSY     SVT ABLATION N/A 2007   TEMPORAL ARTERY BIOPSY / LIGATION     TOTAL ABDOMINAL HYSTERECTOMY W/ BILATERAL SALPINGOOPHORECTOMY  1985   TOTAL KNEE ARTHROPLASTY Left 01/10/2012   TOTAL KNEE ARTHROPLASTY Right  12/20/2022   Procedure: TOTAL KNEE ARTHROPLASTY;  Surgeon: Venus Ginsberg, MD;  Location: [facility] ORS;  Service: Orthopedics;  Laterality: Right;   TUBAL LIGATION      OB History   No obstetric history on file.      Home Medications    Prior to Admission medications   Medication Sig Start Date End Date Taking? Authorizing Provider  acetaminophen  (TYLENOL ) 500 MG tablet Take 1 tablet (500 mg total) by mouth every 6 (six) hours as needed. 12/21/22   Coralyn Derry, PA-C  Apoaequorin (PREVAGEN) 10 MG CAPS Take 1 capsule by mouth daily.    [provider]  Bacillus Coagulans-Inulin (ALIGN PREBIOTIC-PROBIOTIC PO) Take 2 tablets by mouth daily.    [provider]  Calcium -Magnesium -Zinc  333-133-5 MG TABS Take 1 tablet by mouth daily.    [provider]  carbidopa -levodopa  (SINEMET  IR) 25-100 MG tablet Take 1 tablet by mouth 4 (four) times daily. 07/04/20 07/14/23  [provider]  cetirizine (ZYRTEC) 10 MG tablet Take 10 mg by mouth at bedtime.    [provider]  Cholecalciferol (VITAMIN D) 125 MCG (5000 UT) CAPS Take 1 capsule by mouth daily.    [provider]  ciprofloxacin  (CIPRO ) 500 MG tablet Take 1 tablet (500 mg total) by mouth every 12 (twelve) hours. 09/02/23   Alberta Lenhard, DO  conjugated estrogens (PREMARIN) vaginal cream Place 1 g vaginally 3 (three) times a week. 1 drop on the urethral for urinary incontinence M-W-F    [provider]  cycloSPORINE  (RESTASIS ) 0.05 % ophthalmic emulsion Place 1 drop into both eyes 2 (two) times daily.    [provider]  diclofenac Sodium (VOLTAREN ARTHRITIS PAIN) 1 % GEL Apply 2 g topically daily.    [provider]  docusate sodium  (COLACE) 100 MG capsule Take 1 capsule (100 mg total) by mouth 2 (two) times daily. 12/21/22   Coralyn Derry, PA-C  fluticasone  (FLONASE ) 50 MCG/ACT nasal spray Place 1 spray into both nostrils 2 (two) times daily.    [provider]  levothyroxine  (SYNTHROID ) 50 MCG tablet Take 50 mcg by mouth daily before breakfast.    [provider]  metoprolol  succinate (TOPROL -XL) 25 MG 24 hr tablet Take 12.5 mg by mouth daily.    [provider]  midodrine  (PROAMATINE ) 2.5 MG tablet Take 1 tablet (2.5 mg total) by mouth 3 (three) times daily as needed (for top number for blood pressure lower than 90). 09/17/22   Patel, Pranav M, MD  Multiple Vitamins-Minerals (CENTRUM SILVER 50+WOMEN PO) Take 1 tablet by mouth daily.    [provider]  Multiple Vitamins-Minerals (PRESERVISION AREDS 2) CAPS Take 1 capsule by mouth 2 (two) times daily.    [provider]  Omega-3 Fatty Acids (FISH OIL) 1200 MG CAPS Take 1 capsule by mouth daily.    [provider]  PARoxetine  (PAXIL ) 10 MG  tablet Take 10 mg by mouth at bedtime.    [provider]  Polyethyl Glycol-Propyl Glycol 0.4-0.3 % SOLN Place 1 drop into both eyes 2 (two) times daily.    [provider]  polyethylene glycol powder (GLYCOLAX /MIRALAX ) 17 GM/SCOOP powder Take 8.5 g by mouth daily. 1/2 scoop as needed    [provider]    Family History Family History  Problem Relation Age of Onset   Breast cancer Neg Hx     Social History Social History   Tobacco Use   Smoking status: Never   Smokeless tobacco: Never  Vaping Use   Vaping status: Never Used  Substance Use Topics   Alcohol use: No   Drug use: No     Allergies   Diltiazem hcl, Cardizem [diltiazem], Statins, Aloe, Doxycycline, Nitrofurantoin, Penicillins, and Sulfamethoxazole-trimethoprim   Review of Systems Review of Systems: :negative unless otherwise stated in HPI.      Physical Exam Triage Vital Signs ED Triage Vitals  Encounter Vitals Group     BP 09/02/23 1012 (!) 160/64     Systolic BP Percentile --      Diastolic BP Percentile --      Pulse Rate 09/02/23 1012 63     Resp 09/02/23 1012 14     Temp 09/02/23 1012 98.1 F (36.7 C)      Temp Source 09/02/23 1012 Oral     SpO2 09/02/23 1012 98 %     Weight 09/02/23 1010 130 lb 15.3 oz (59.4 kg)     Height 09/02/23 1010 5\' 5"  (1.651 m)     Head Circumference --      Peak Flow --      Pain Score 09/02/23 1010 5     Pain Loc --      Pain Education --      Exclude from Growth Chart --    No data found.  Updated Vital Signs BP (!) 160/64 (BP Location: Left Arm)   Pulse 63   Temp 98.1 F (36.7 C) (Oral)   Resp 14   Ht 5\' 5"  (1.651 m)   Wt 59.4 kg   SpO2 98%   BMI 21.79 kg/m   Visual Acuity Right Eye Distance:   Left Eye Distance:   Bilateral Distance:    Right Eye Near:   Left Eye Near:    Bilateral Near:     Physical Exam GEN: well appearing female in no acute distress  CVS: well perfused  RESP: speaking in full sentences without pause  ABD: soft, non-tender, non-distended, no palpable masses, no CVA tenderness   SKIN: warm, dry    UC Treatments / Results  Labs (all labs ordered are listed, but only abnormal results are displayed) Labs Reviewed  URINALYSIS, W/ REFLEX TO CULTURE (INFECTION SUSPECTED) - Abnormal; Notable for the following components:      Result Value   APPearance CLOUDY (*)    Hgb urine dipstick MODERATE (*)    Ketones, ur TRACE (*)    Protein, ur 100 (*)    Leukocytes,Ua LARGE (*)    Bacteria, UA MANY (*)    All other components within normal limits  URINE CULTURE    EKG   Radiology No results found.  Procedures Procedures (including critical care time)  Medications Ordered in UC Medications - No data to display  Initial Impression / Assessment and Plan / UC Course  I have reviewed the triage vital signs and the nursing notes.  Pertinent labs &  imaging results that were available during my care of the patient were reviewed by me and considered in my medical decision making (see chart for details).      Patient is a 89 y.o.Aaron Aas female  who has history of recurrent UTIs presents for dysuria. Overall patient is  well-appearing and afebrile.  Vital signs stable.  Urinalysis consistent with acute cystitis with hematuria and proteinuria.   hematuria supported on microscopy.    On chart review, last seen by Dr. Ledon Pry, urologist, in November 2024 for recurrent UTIs.  She has multiple allergies to antibiotics. Treat with Cipro  2 times daily for 10 days. Urine culture obtained.  Follow-up sensitivities and change antibiotics, if needed.  Instructed patient to follow-up with her urologist on Monday as able close today for the holiday. Return precautions including abdominal pain, fever, chills, nausea, or vomiting given. Discussed MDM, treatment plan and plan for follow-up with patient who agrees with plan.        Final Clinical Impressions(s) / UC Diagnoses   Final diagnoses:  Acute cystitis with hematuria  History of recurrent UTIs     Discharge Instructions      Stop by the pharmacy to pick up your prescriptions.  Follow up with your primary care provider or return to the urgent care, if not improving.   I sent your urine for culture.  Call your urologist on Monday.      ED Prescriptions     Medication Sig Dispense Auth. Provider   ciprofloxacin  (CIPRO ) 500 MG tablet  (Status: Discontinued) Take 1 tablet (500 mg total) by mouth every 12 (twelve) hours. 10 tablet Ravyn Nikkel, DO   ciprofloxacin  (CIPRO ) 500 MG tablet Take 1 tablet (500 mg total) by mouth every 12 (twelve) hours. 20 tablet Alys Dulak, DO      PDMP not reviewed this encounter.   Raena Pau, DO 09/02/23 1055

## 2023-09-02 NOTE — ED Triage Notes (Signed)
 Patient c/o dysuria that last night.  Patient reports some urinary urgency.

## 2023-09-02 NOTE — Discharge Instructions (Addendum)
 Stop by the pharmacy to pick up your prescriptions.  Follow up with your primary care provider or return to the urgent care, if not improving.   I sent your urine for culture.  Call your urologist on Monday.

## 2023-09-04 LAB — URINE CULTURE: Culture: 20000 — AB

## 2023-09-06 NOTE — Telephone Encounter (Signed)
 Appt scheduled for Next thursday, Sep 15, 2023

## 2023-09-15 ENCOUNTER — Ambulatory Visit (INDEPENDENT_AMBULATORY_CARE_PROVIDER_SITE_OTHER): Admitting: Physician Assistant

## 2023-09-15 ENCOUNTER — Encounter: Payer: Self-pay | Admitting: Physician Assistant

## 2023-09-15 VITALS — BP 124/79 | HR 66 | Ht 65.0 in | Wt 133.0 lb

## 2023-09-15 DIAGNOSIS — N390 Urinary tract infection, site not specified: Secondary | ICD-10-CM

## 2023-09-15 LAB — BLADDER SCAN AMB NON-IMAGING

## 2023-09-15 NOTE — Progress Notes (Signed)
 09/15/2023 2:36 PM   Tracy Long 1934/05/19 [id number]  CC: Chief Complaint  Patient presents with   Urinary Tract Infection   HPI: Tracy Long is a 89 y.o. female with PMH cystocele and recurrent UTI on Premarin who presents today for urgent care follow-up of UTI.   She developed dysuria and gross hematuria around the Easter holiday and sought care at urgent care.  Urine culture grew low colony counts of pansensitive E. coli.  She was treated with Cipro , which she has not yet completed.  Today she reports her symptoms have completely resolved and she feels well with no acute concerns.  In-office UA today positive for trace glucose; urine microscopy pan negative.  PMH: Past Medical History:  Diagnosis Date   Arthritis    Chronically dry eyes    a.) on cyclosporine  gtts   Compression fracture of lumbar vertebra (HCC)    a.) s/p L1-L2 kyphoplasty   Coronary artery disease involving native coronary artery of native heart without angina pectoris    a.) s/p MI 1982; b.) s/p type II NSTEMI secondary to AVRT 08/31/2005: LHC 09/01/2005 - normal coronaries; c.) MV 11/03/2016: no ischemia   Depression    Diverticulosis    GERD (gastroesophageal reflux disease)    H/O bladder infections    History of bilateral cataract extraction 2003   History of right breast cancer 2006   a.) CNB (+) ADH with suspicion of DCIS --> s/p partial mastectomy 05/06/2001 (DCIS)   HLD (hyperlipidemia)    Hypertension    Hypothyroidism    Inverted nipple    Iron deficiency    Lower GI bleed 09/13/2022   a.) Tx'd by vascular 09/14/2022 --> s/p microbead embolization of LEFT colonic branch of IMA and splenic branch of SMA + coil embolization with 2 mm Ruby coils to splenic flexure branch off of LEFT colonic branch of IMA   MCI (mild cognitive impairment)    a.) takes apoequorin   Myocardial infarction (HCC) 1982   Osteopenia    Palpitations    Parkinson disease (HCC)     Polymyalgia rheumatica (HCC)    Postoperative pulmonary embolism (HCC) 2013   a.) s/p LEFT TKA   PVD (peripheral vascular disease) (HCC)    a.) s/p BILATERAL femoral stent placement in 2000   Raynaud's disease    Rheumatoid arthritis (HCC)    Supraventricular tachycardia (HCC)    a.) s/p ablation for AVRT 2007; b.) rate/rhythm currently controlled with oral metoprolol  succinate   Type 2 myocardial infarction (HCC) 08/31/2005   a.) Type II NSTEMI r/t demand ischemia secondary to SVT/AVRT; b.) LHC 09/01/2005 - normal coronaries; no sig CAD.    Surgical History: Past Surgical History:  Procedure Laterality Date   BILATERAL FEMORAL STENTS Bilateral 2000   BREAST EXCISIONAL BIOPSY Bilateral    [facility] (BENIGN MASS)   BREAST LUMPECTOMY Right 04/2005   CARDIAC CATHETERIZATION  09/01/2005   CATARACT EXTRACTION W/ INTRAOCULAR LENS  IMPLANT, BILATERAL Bilateral 2003   COLONOSCOPY WITH PROPOFOL  N/A 09/14/2022   Procedure: COLONOSCOPY WITH PROPOFOL ;  Surgeon: Quintin Buckle, DO;  Location: [facility] ENDOSCOPY;  Service: Gastroenterology;  Laterality: N/A;   EMBOLIZATION (CATH LAB) N/A 09/14/2022   Procedure: EMBOLIZATION;  Surgeon: Celso College, MD;  Location: [facility] INVASIVE CV LAB;  Service: Cardiovascular;  Laterality: N/A;   EPIRETINAL MEMBRANE REMOVED Bilateral    ESOPHAGOGASTRODUODENOSCOPY  08/21/2012   ESOPHAGOGASTRODUODENOSCOPY N/A 07/14/2023   Procedure: ESOPHAGOGASTRODUODENOSCOPY (EGD);  Surgeon: Quintin Buckle, DO;  Location:  [facility] ENDOSCOPY;  Service: Gastroenterology;  Laterality: N/A;   L1-L2 KYPHOPLASTY N/A    POSTERIOR TIBIAL TENDON REPAIR Left    SKIN BIOPSY     SVT ABLATION N/A 2007   TEMPORAL ARTERY BIOPSY / LIGATION     TOTAL ABDOMINAL HYSTERECTOMY W/ BILATERAL SALPINGOOPHORECTOMY  1985   TOTAL KNEE ARTHROPLASTY Left 01/10/2012   TOTAL KNEE ARTHROPLASTY Right 12/20/2022   Procedure: TOTAL KNEE ARTHROPLASTY;  Surgeon: Venus Ginsberg, MD;  Location: [facility] ORS;  Service:  Orthopedics;  Laterality: Right;   TUBAL LIGATION      Home Medications:  Allergies as of 09/15/2023       Reactions   Diltiazem Hcl Hives   Cardizem [diltiazem] Hives   Statins Other (See Comments)   Unknown reaction   Aloe Hives, Rash   Doxycycline Hives, Other (See Comments)   RASH - HAND ITCHING   Nitrofurantoin Hives, Rash   08-11-12 per pt caused Esophagitis and Urticaria   Penicillins Hives, Rash   Sulfamethoxazole-trimethoprim Nausea Only        Medication List        Accurate as of Sep 15, 2023  2:36 PM. If you have any questions, ask your nurse or doctor.          acetaminophen  500 MG tablet Commonly known as: TYLENOL  Take 1 tablet (500 mg total) by mouth every 6 (six) hours as needed.   ALIGN PREBIOTIC-PROBIOTIC PO Take 2 tablets by mouth daily.   Calcium -Magnesium -Zinc  333-133-5 MG Tabs Take 1 tablet by mouth daily.   carbidopa -levodopa  25-100 MG tablet Commonly known as: SINEMET  IR Take 1 tablet by mouth 4 (four) times daily.   cetirizine 10 MG tablet Commonly known as: ZYRTEC Take 10 mg by mouth at bedtime.   ciprofloxacin  500 MG tablet Commonly known as: CIPRO  Take 1 tablet (500 mg total) by mouth every 12 (twelve) hours.   conjugated estrogens 0.625 MG/GM vaginal cream Commonly known as: PREMARIN Place 1 g vaginally 3 (three) times a week. 1 drop on the urethral for urinary incontinence M-W-F   cycloSPORINE  0.05 % ophthalmic emulsion Commonly known as: RESTASIS  Place 1 drop into both eyes 2 (two) times daily.   docusate sodium  100 MG capsule Commonly known as: COLACE Take 1 capsule (100 mg total) by mouth 2 (two) times daily.   Fish Oil 1200 MG Caps Take 1 capsule by mouth daily.   fluticasone  50 MCG/ACT nasal spray Commonly known as: FLONASE  Place 1 spray into both nostrils 2 (two) times daily.   levothyroxine  50 MCG tablet Commonly known as: SYNTHROID  Take 50 mcg by mouth daily before breakfast.   metoprolol  succinate 25 MG  24 hr tablet Commonly known as: TOPROL -XL Take 12.5 mg by mouth daily.   midodrine  2.5 MG tablet Commonly known as: PROAMATINE  Take 1 tablet (2.5 mg total) by mouth 3 (three) times daily as needed (for top number for blood pressure lower than 90).   PARoxetine  10 MG tablet Commonly known as: PAXIL  Take 10 mg by mouth at bedtime.   Polyethyl Glycol-Propyl Glycol 0.4-0.3 % Soln Place 1 drop into both eyes 2 (two) times daily.   polyethylene glycol powder 17 GM/SCOOP powder Commonly known as: GLYCOLAX /MIRALAX  Take 8.5 g by mouth daily. 1/2 scoop as needed   PreserVision AREDS 2 Caps Take 1 capsule by mouth 2 (two) times daily.   CENTRUM SILVER 50+WOMEN PO Take 1 tablet by mouth daily.   Prevagen 10 MG Caps Generic drug: Apoaequorin Take 1 capsule by mouth daily.  Vitamin D 125 MCG (5000 UT) Caps Take 1 capsule by mouth daily.   Voltaren Arthritis Pain 1 % Gel Generic drug: diclofenac Sodium Apply 2 g topically daily.        Allergies:  Allergies  Allergen Reactions   Diltiazem Hcl Hives   Cardizem [Diltiazem] Hives   Statins Other (See Comments)    Unknown reaction   Aloe Hives and Rash   Doxycycline Hives and Other (See Comments)    RASH - HAND ITCHING   Nitrofurantoin Hives and Rash    08-11-12 per pt caused Esophagitis and Urticaria   Penicillins Hives and Rash   Sulfamethoxazole-Trimethoprim Nausea Only    Family History: Family History  Problem Relation Age of Onset   Breast cancer Neg Hx     Social History:   reports that she has never smoked. She has never used smokeless tobacco. She reports that she does not drink alcohol and does not use drugs.  Physical Exam: BP 124/79   Pulse 66   Ht 5\' 5"  (1.651 m)   Wt 133 lb (60.3 kg)   BMI 22.13 kg/m   Constitutional:  Alert and oriented, no acute distress, nontoxic appearing HEENT: [place], AT Cardiovascular: No clubbing, cyanosis, or edema Respiratory: Normal respiratory effort, no increased work of  breathing Skin: No rashes, bruises or suspicious lesions Neurologic: Grossly intact, no focal deficits, moving all 4 extremities Psychiatric: Normal mood and affect  Laboratory Data: Results for orders placed or performed in visit on 09/15/23  Microscopic Examination   Collection Time: 09/15/23  1:40 PM   Urine  Result Value Ref Range   WBC, UA 0-5 0 - 5 /hpf   RBC, Urine 0-2 0 - 2 /hpf   Epithelial Cells (non renal) 0-10 0 - 10 /hpf   Casts Present (A) None seen /lpf   Cast Type Hyaline casts N/A   Crystals Present (A) N/A   Crystal Type Amorphous Sediment N/A   Mucus, UA Present (A) Not Estab.   Bacteria, UA Few None seen/Few  Urinalysis, Complete   Collection Time: 09/15/23  1:40 PM  Result Value Ref Range   Specific Gravity, UA 1.030 1.005 - 1.030   pH, UA 6.0 5.0 - 7.5   Color, UA Orange Yellow   Appearance Ur Clear Clear   Leukocytes,UA Negative Negative   Protein,UA Negative Negative/Trace   Glucose, UA Trace (A) Negative   Ketones, UA Negative Negative   RBC, UA Negative Negative   Bilirubin, UA Negative Negative   Urobilinogen, Ur 0.2 0.2 - 1.0 mg/dL   Nitrite, UA Negative Negative   Microscopic Examination Comment    Microscopic Examination See below:   BLADDER SCAN AMB NON-IMAGING   Collection Time: 09/15/23  2:24 PM  Result Value Ref Range   Scan Result 0ml    Assessment & Plan:   1. Recurrent UTI (Primary) Symptoms resolved on culture appropriate Cipro .  Complete the course.  Follow-up as needed. - Urinalysis, Complete - BLADDER SCAN AMB NON-IMAGING  Return if symptoms worsen or fail to improve.  Kathreen Pare, PA-C  [facility] [place] [address] [place], [place] [address] [phone]

## 2023-09-16 LAB — MICROSCOPIC EXAMINATION

## 2023-09-16 LAB — URINALYSIS, COMPLETE
Bilirubin, UA: NEGATIVE
Ketones, UA: NEGATIVE
Leukocytes,UA: NEGATIVE
Nitrite, UA: NEGATIVE
Protein,UA: NEGATIVE
RBC, UA: NEGATIVE
Specific Gravity, UA: 1.03 (ref 1.005–1.030)
Urobilinogen, Ur: 0.2 mg/dL (ref 0.2–1.0)
pH, UA: 6 (ref 5.0–7.5)

## 2024-03-09 ENCOUNTER — Other Ambulatory Visit: Payer: Self-pay | Admitting: Internal Medicine

## 2024-03-09 DIAGNOSIS — Z1231 Encounter for screening mammogram for malignant neoplasm of breast: Secondary | ICD-10-CM

## 2024-03-20 ENCOUNTER — Telehealth: Payer: Self-pay

## 2024-03-20 ENCOUNTER — Encounter: Payer: Self-pay | Admitting: Urology

## 2024-03-20 ENCOUNTER — Ambulatory Visit: Admitting: Urology

## 2024-03-20 VITALS — BP 159/86 | HR 66 | Ht 65.0 in | Wt 137.9 lb

## 2024-03-20 DIAGNOSIS — N952 Postmenopausal atrophic vaginitis: Secondary | ICD-10-CM | POA: Diagnosis not present

## 2024-03-20 DIAGNOSIS — N390 Urinary tract infection, site not specified: Secondary | ICD-10-CM | POA: Diagnosis not present

## 2024-03-20 DIAGNOSIS — R310 Gross hematuria: Secondary | ICD-10-CM | POA: Diagnosis not present

## 2024-03-20 LAB — BLADDER SCAN AMB NON-IMAGING

## 2024-03-20 MED ORDER — CIPROFLOXACIN HCL 250 MG PO TABS: 250.0000 mg | ORAL_TABLET | Freq: Two times a day (BID) | ORAL | 0 refills | Status: DC

## 2024-03-20 NOTE — Telephone Encounter (Signed)
 Pt called the triage line stating she has urinary frequency, chills , vaginal irritation that started today, pt is schedule for a PA visit for a UA, pt aware of appt.

## 2024-03-20 NOTE — Progress Notes (Unsigned)
 03/20/2024 3:07 PM   Tracy Long [birth year] [id number]  Referring provider: Rudolpho Norleen BIRCH, MD [address] [facility] [place],  [place] [address]  Urological history: 1. rUTI's -Contributing factors of age, GSM and constipation - September 02, 2023, E. coli -Vaginal estrogen cream 3 nights weekly, probiotics, cranberry tablets, d-mannose and good hydration  2. Pelvic floor prolapse  Chief Complaint  Patient presents with   Microscopic hematuria   HPI: Tracy Long is a 89 y.o. female who presents today for frequency, chills and vaginal irritation.   Previous records reviewed.   When she woke up this morning, her first void was very painful, causing electric shocks, and she thinks she may have seen a drop of blood.  This occurred with first 2-3 times she voided this morning along with urge incontinence, but as the day has gone on she has seen a lessening in her symptoms.  She does drink a lot of water.  Patient denies any modifying or aggravating factors.  Patient denies any suprapubic/flank pain.  Patient denies any fevers, chills, nausea or vomiting.    UA orange cloudy, specific gravity 1.025, pH 6.0, 1+ protein, 3+ heme, trace ketone, 3+ leukocyte, greater than 30 WBC's, 11-30 RBCs, 0 to epithelial cells, hyaline cast present and a few bacteria.    PVR 11 mL   PMH: Past Medical History:  Diagnosis Date   Arthritis    Chronically dry eyes    a.) on cyclosporine  gtts   Compression fracture of lumbar vertebra (HCC)    a.) s/p L1-L2 kyphoplasty   Coronary artery disease involving native coronary artery of native heart without angina pectoris    a.) s/p MI 1982; b.) s/p type II NSTEMI secondary to AVRT 08/31/2005: LHC 09/01/2005 - normal coronaries; c.) MV 11/03/2016: no ischemia   Depression    Diverticulosis    GERD (gastroesophageal reflux disease)    H/O bladder infections    History of bilateral cataract extraction 2003   History of  right breast cancer 2006   a.) CNB (+) ADH with suspicion of DCIS --> s/p partial mastectomy 05/06/2001 (DCIS)   HLD (hyperlipidemia)    Hypertension    Hypothyroidism    Inverted nipple    Iron deficiency    Lower GI bleed 09/13/2022   a.) Tx'd by vascular 09/14/2022 --> s/p microbead embolization of LEFT colonic branch of IMA and splenic branch of SMA + coil embolization with 2 mm Ruby coils to splenic flexure branch off of LEFT colonic branch of IMA   MCI (mild cognitive impairment)    a.) takes apoequorin   Myocardial infarction (HCC) 1982   Osteopenia    Palpitations    Parkinson disease (HCC)    Polymyalgia rheumatica    Postoperative pulmonary embolism (HCC) 2013   a.) s/p LEFT TKA   PVD (peripheral vascular disease)    a.) s/p BILATERAL femoral stent placement in 2000   Raynaud's disease    Rheumatoid arthritis (HCC)    Supraventricular tachycardia    a.) s/p ablation for AVRT 2007; b.) rate/rhythm currently controlled with oral metoprolol  succinate   Type 2 myocardial infarction (HCC) 08/31/2005   a.) Type II NSTEMI r/t demand ischemia secondary to SVT/AVRT; b.) LHC 09/01/2005 - normal coronaries; no sig CAD.    Surgical History: Past Surgical History:  Procedure Laterality Date   BILATERAL FEMORAL STENTS Bilateral 2000   BREAST EXCISIONAL BIOPSY Bilateral    [facility] (BENIGN MASS)   BREAST LUMPECTOMY  Right 04/2005   CARDIAC CATHETERIZATION  09/01/2005   CATARACT EXTRACTION W/ INTRAOCULAR LENS  IMPLANT, BILATERAL Bilateral 2003   COLONOSCOPY WITH PROPOFOL  N/A 09/14/2022   Procedure: COLONOSCOPY WITH PROPOFOL ;  Surgeon: Onita Elspeth Sharper, DO;  Location: [facility] ENDOSCOPY;  Service: Gastroenterology;  Laterality: N/A;   EMBOLIZATION (CATH LAB) N/A 09/14/2022   Procedure: EMBOLIZATION;  Surgeon: Marea Selinda RAMAN, MD;  Location: [facility] INVASIVE CV LAB;  Service: Cardiovascular;  Laterality: N/A;   EPIRETINAL MEMBRANE REMOVED Bilateral    ESOPHAGOGASTRODUODENOSCOPY  08/21/2012    ESOPHAGOGASTRODUODENOSCOPY N/A 07/14/2023   Procedure: ESOPHAGOGASTRODUODENOSCOPY (EGD);  Surgeon: Onita Elspeth Sharper, DO;  Location: [facility] ENDOSCOPY;  Service: Gastroenterology;  Laterality: N/A;   L1-L2 KYPHOPLASTY N/A    POSTERIOR TIBIAL TENDON REPAIR Left    SKIN BIOPSY     SVT ABLATION N/A 2007   TEMPORAL ARTERY BIOPSY / LIGATION     TOTAL ABDOMINAL HYSTERECTOMY W/ BILATERAL SALPINGOOPHORECTOMY  1985   TOTAL KNEE ARTHROPLASTY Left 01/10/2012   TOTAL KNEE ARTHROPLASTY Right 12/20/2022   Procedure: TOTAL KNEE ARTHROPLASTY;  Surgeon: Lorelle Hussar, MD;  Location: [facility] ORS;  Service: Orthopedics;  Laterality: Right;   TUBAL LIGATION      Home Medications:  Allergies as of 03/20/2024       Reactions   Diltiazem Hcl Hives   Cardizem [diltiazem] Hives   Statins Other (See Comments)   Unknown reaction   Aloe Hives, Rash   Doxycycline Hives, Other (See Comments)   RASH - HAND ITCHING   Nitrofurantoin Hives, Rash   08-11-12 per pt caused Esophagitis and Urticaria   Other Other (See Comments)   Unknown reaction   Penicillins Hives, Rash   Sulfamethoxazole-trimethoprim Nausea Only        Medication List        Accurate as of March 20, 2024  3:07 PM. If you have any questions, ask your nurse or doctor.          acetaminophen  500 MG tablet Commonly known as: TYLENOL  Take 1 tablet (500 mg total) by mouth every 6 (six) hours as needed.   ALIGN PREBIOTIC-PROBIOTIC PO Take 2 tablets by mouth daily.   Calcium -Magnesium -Zinc  333-133-5 MG Tabs Take 1 tablet by mouth daily.   carbidopa -levodopa  25-100 MG tablet Commonly known as: SINEMET  IR Take 1 tablet by mouth. What changed: Another medication with the same name was removed. Continue taking this medication, and follow the directions you see here. Changed by: CLOTILDA CORNWALL   cetirizine 10 MG tablet Commonly known as: ZYRTEC Take 10 mg by mouth at bedtime.   ciprofloxacin  500 MG tablet Commonly known as:  CIPRO  Take 1 tablet (500 mg total) by mouth every 12 (twelve) hours.   clindamycin 150 MG capsule Commonly known as: CLEOCIN SMARTSIG:4 Capsule(s) By Mouth   conjugated estrogens 0.625 MG/GM vaginal cream Commonly known as: PREMARIN Place 1 g vaginally 3 (three) times a week. 1 drop on the urethral for urinary incontinence M-W-F   cycloSPORINE  0.05 % ophthalmic emulsion Commonly known as: RESTASIS  Place 1 drop into both eyes 2 (two) times daily.   docusate sodium  100 MG capsule Commonly known as: COLACE Take 1 capsule (100 mg total) by mouth 2 (two) times daily.   entacapone 200 MG tablet Commonly known as: COMTAN Take 200 mg by mouth 4 (four) times daily.   Fish Oil 1200 MG Caps Take 1 capsule by mouth daily.   fluticasone  50 MCG/ACT nasal spray Commonly known as: FLONASE  Place 1 spray into both nostrils 2 (  two) times daily.   levothyroxine  50 MCG tablet Commonly known as: SYNTHROID  Take 50 mcg by mouth daily before breakfast.   meloxicam 15 MG tablet Commonly known as: MOBIC Take 15 mg by mouth daily.   metoprolol  succinate 25 MG 24 hr tablet Commonly known as: TOPROL -XL Take 12.5 mg by mouth daily.   midodrine  2.5 MG tablet Commonly known as: PROAMATINE  Take 1 tablet (2.5 mg total) by mouth 3 (three) times daily as needed (for top number for blood pressure lower than 90).   pantoprazole  40 MG tablet Commonly known as: PROTONIX  Take 40 mg by mouth 2 (two) times daily.   PARoxetine  10 MG tablet Commonly known as: PAXIL  Take 10 mg by mouth at bedtime.   Polyethyl Glycol-Propyl Glycol 0.4-0.3 % Soln Place 1 drop into both eyes 2 (two) times daily.   polyethylene glycol powder 17 GM/SCOOP powder Commonly known as: GLYCOLAX /MIRALAX  Take 8.5 g by mouth daily. 1/2 scoop as needed   PreserVision AREDS 2 Caps Take 1 capsule by mouth 2 (two) times daily.   CENTRUM SILVER 50+WOMEN PO Take 1 tablet by mouth daily.   Prevagen 10 MG Caps Generic drug:  Apoaequorin Take 1 capsule by mouth daily.   tobramycin-dexamethasone  ophthalmic solution Commonly known as: TOBRADEX Apply 1 drop to eye.   Vitamin D 125 MCG (5000 UT) Caps Take 1 capsule by mouth daily.   Voltaren Arthritis Pain 1 % Gel Generic drug: diclofenac Sodium Apply 2 g topically daily.        Allergies:  Allergies  Allergen Reactions   Diltiazem Hcl Hives   Cardizem [Diltiazem] Hives   Statins Other (See Comments)    Unknown reaction   Aloe Hives and Rash   Doxycycline Hives and Other (See Comments)    RASH - HAND ITCHING   Nitrofurantoin Hives and Rash    08-11-12 per pt caused Esophagitis and Urticaria   Other Other (See Comments)    Unknown reaction   Penicillins Hives and Rash   Sulfamethoxazole-Trimethoprim Nausea Only    Family History: Family History  Problem Relation Age of Onset   Breast cancer Neg Hx     Social History:  reports that she has never smoked. She has never used smokeless tobacco. She reports that she does not drink alcohol and does not use drugs.  ROS: Pertinent ROS in HPI  Physical Exam: BP (!) 159/86   Pulse 66   Ht 5' 5 (1.651 m)   Wt 137 lb 14.4 oz (62.6 kg)   BMI 22.95 kg/m   Constitutional:  Well nourished. Alert and oriented, No acute distress. HEENT: [place] AT, moist mucus membranes.  Trachea midline, no masses. Cardiovascular: No clubbing, cyanosis, or edema. Respiratory: Normal respiratory effort, no increased work of breathing. GU: No CVA tenderness.  No bladder fullness or masses.  Recession of labia minora, dry, pale vulvar vaginal mucosa and loss of mucosal ridges and folds.  Normal urethral meatus, no lesions, no prolapse, no discharge.   No urethral masses, tenderness and/or tenderness. No bladder fullness, tenderness or masses. *** vagina mucosa, *** estrogen effect, no discharge, no lesions, *** pelvic support, *** cystocele and *** rectocele noted.  No cervical motion tenderness.  Uterus is freely mobile and  non-fixed.  No adnexal/parametria masses or tenderness noted.  Anus and perineum are without rashes or lesions.   ***  Neurologic: Grossly intact, no focal deficits, moving all 4 extremities. Psychiatric: Normal mood and affect.    Laboratory Data: See Epic and HPI  I have reviewed the labs.   Pertinent Imaging:  03/20/24 15:01  Scan Result 11ml    Assessment & Plan:    1. rUTI's - UA *** - urine culture pending  3. GSM -Patient will continue the vaginal estrogen cream, applying it 3 nights weekly   No follow-ups on file.  These notes generated with voice recognition software. I apologize for typographical errors.  CLOTILDA HELON RIGGERS  [facility] [address]  [address] [place], [place] [address] [phone]

## 2024-03-21 LAB — MICROSCOPIC EXAMINATION: WBC, UA: >30 /HPF — AB (ref 0–5)

## 2024-03-21 LAB — URINALYSIS, COMPLETE
Bilirubin, UA: NEGATIVE
Glucose, UA: NEGATIVE
Nitrite, UA: NEGATIVE
Specific Gravity, UA: 1.025 (ref 1.005–1.030)
Urobilinogen, Ur: 0.2 mg/dL (ref 0.2–1.0)
pH, UA: 6 (ref 5.0–7.5)

## 2024-03-26 ENCOUNTER — Ambulatory Visit: Payer: Self-pay | Admitting: Urology

## 2024-03-26 LAB — CULTURE, URINE COMPREHENSIVE

## 2024-03-30 ENCOUNTER — Ambulatory Visit: Payer: Self-pay | Admitting: Urology

## 2024-04-05 ENCOUNTER — Ambulatory Visit: Admitting: Urology

## 2024-04-11 ENCOUNTER — Ambulatory Visit
Admission: RE | Admit: 2024-04-11 | Discharge: 2024-04-11 | Disposition: A | Discharge status: Home / Self Care | Source: Ambulatory Visit | Attending: Internal Medicine | Admitting: Internal Medicine

## 2024-04-11 DIAGNOSIS — Z1231 Encounter for screening mammogram for malignant neoplasm of breast: Secondary | ICD-10-CM | POA: Insufficient documentation

## 2024-05-08 NOTE — Telephone Encounter (Signed)
-----   Message from [facility] sent at 05/07/2024  7:33 AM EST ----- She has not seen this MyChart message.  Would you call her and make sure she completed her Cipro  and that she feels better.    Tracy Long,  Your urine culture was positive for infection and the Cipro  that I prescribed will treat the infection according to the urine culture and sensitivity results.  I recommend that you start the  antibiotic if you already have not.  We encourage you to keep your appointment in January so that we can make sure we have treated the infection adequately. In the meantime, we are happy to help you  with any of your acute urological needs.   SHANNON MCGOWAN, PA-C

## 2024-05-08 NOTE — Telephone Encounter (Signed)
 Spoke with patient in regards urine culture being positive. Patient reports that Cipro  was taken and symptoms have resolved. Advised patient to keep follow-up appointment in January.   Andrea Kirks LPN

## 2024-05-24 NOTE — Progress Notes (Unsigned)
 "    05/25/2024 10:27 AM   Tracy Long August 13, 1934 [id number]  Referring provider: Rudolpho Norleen BIRCH, MD [address] [facility] [place],  [place] [address]  Urological history: 1. rUTI's -Contributing factors of age, GSM and constipation - March 20, 2024 - E.coli  - September 02, 2023, E. coli -Vaginal estrogen cream 3 nights weekly, probiotics, cranberry tablets, d-mannose and good hydration  2. Pelvic floor prolapse  Chief Complaint  Patient presents with   Recurrent UTI   HPI: Tracy Long is a 89 y.o. female who presents today for frequency, chills and vaginal irritation.   Previous records reviewed.     UA yellow clear, specific gravity 1.025, pH 6.0, trace heme, trace ketone, 0-5 WBCs, 3-10 RBCs, 0-10 epithelial cells and few bacteria.   PMH: Past Medical History:  Diagnosis Date   Arthritis    Chronically dry eyes    a.) on cyclosporine  gtts   Compression fracture of lumbar vertebra (HCC)    a.) s/p L1-L2 kyphoplasty   Coronary artery disease involving native coronary artery of native heart without angina pectoris    a.) s/p MI 1982; b.) s/p type II NSTEMI secondary to AVRT 08/31/2005: LHC 09/01/2005 - normal coronaries; c.) MV 11/03/2016: no ischemia   Depression    Diverticulosis    GERD (gastroesophageal reflux disease)    H/O bladder infections    History of bilateral cataract extraction 2003   History of right breast cancer 2006   a.) CNB (+) ADH with suspicion of DCIS --> s/p partial mastectomy 05/06/2001 (DCIS)   HLD (hyperlipidemia)    Hypertension    Hypothyroidism    Inverted nipple    Iron deficiency    Lower GI bleed 09/13/2022   a.) Tx'd by vascular 09/14/2022 --> s/p microbead embolization of LEFT colonic branch of IMA and splenic branch of SMA + coil embolization with 2 mm Ruby coils to splenic flexure branch off of LEFT colonic branch of IMA   MCI (mild cognitive impairment)    a.) takes apoequorin   Myocardial  infarction (HCC) 1982   Osteopenia    Palpitations    Parkinson disease (HCC)    Polymyalgia rheumatica    Postoperative pulmonary embolism (HCC) 2013   a.) s/p LEFT TKA   PVD (peripheral vascular disease)    a.) s/p BILATERAL femoral stent placement in 2000   Raynaud's disease    Rheumatoid arthritis (HCC)    Supraventricular tachycardia    a.) s/p ablation for AVRT 2007; b.) rate/rhythm currently controlled with oral metoprolol  succinate   Type 2 myocardial infarction (HCC) 08/31/2005   a.) Type II NSTEMI r/t demand ischemia secondary to SVT/AVRT; b.) LHC 09/01/2005 - normal coronaries; no sig CAD.    Surgical History: Past Surgical History:  Procedure Laterality Date   BILATERAL FEMORAL STENTS Bilateral 2000   BREAST EXCISIONAL BIOPSY Bilateral    [facility] (BENIGN MASS)   BREAST LUMPECTOMY Right 04/2005   CARDIAC CATHETERIZATION  09/01/2005   CATARACT EXTRACTION W/ INTRAOCULAR LENS  IMPLANT, BILATERAL Bilateral 2003   COLONOSCOPY WITH PROPOFOL  N/A 09/14/2022   Procedure: COLONOSCOPY WITH PROPOFOL ;  Surgeon: Onita Elspeth Sharper, DO;  Location: [facility] ENDOSCOPY;  Service: Gastroenterology;  Laterality: N/A;   EMBOLIZATION (CATH LAB) N/A 09/14/2022   Procedure: EMBOLIZATION;  Surgeon: Marea Selinda RAMAN, MD;  Location: [facility] INVASIVE CV LAB;  Service: Cardiovascular;  Laterality: N/A;   EPIRETINAL MEMBRANE REMOVED Bilateral    ESOPHAGOGASTRODUODENOSCOPY  08/21/2012   ESOPHAGOGASTRODUODENOSCOPY N/A 07/14/2023  Procedure: ESOPHAGOGASTRODUODENOSCOPY (EGD);  Surgeon: Onita Elspeth Sharper, DO;  Location: [facility] ENDOSCOPY;  Service: Gastroenterology;  Laterality: N/A;   L1-L2 KYPHOPLASTY N/A    POSTERIOR TIBIAL TENDON REPAIR Left    SKIN BIOPSY     SVT ABLATION N/A 2007   TEMPORAL ARTERY BIOPSY / LIGATION     TOTAL ABDOMINAL HYSTERECTOMY W/ BILATERAL SALPINGOOPHORECTOMY  1985   TOTAL KNEE ARTHROPLASTY Left 01/10/2012   TOTAL KNEE ARTHROPLASTY Right 12/20/2022   Procedure: TOTAL KNEE  ARTHROPLASTY;  Surgeon: Lorelle Hussar, MD;  Location: [facility] ORS;  Service: Orthopedics;  Laterality: Right;   TUBAL LIGATION      Home Medications:  Allergies as of 05/25/2024       Reactions   Diltiazem Hcl Hives   Cardizem [diltiazem] Hives   Statins Other (See Comments)   Unknown reaction   Aloe Hives, Rash   Doxycycline Hives, Other (See Comments)   RASH - HAND ITCHING   Nitrofurantoin Hives, Rash   08-11-12 per pt caused Esophagitis and Urticaria   Other Other (See Comments)   Unknown reaction   Penicillins Hives, Rash   Sulfamethoxazole-trimethoprim Nausea Only        Medication List        Accurate as of May 25, 2024 10:27 AM. If you have any questions, ask your nurse or doctor.          carbidopa -levodopa  25-100 MG tablet Commonly known as: SINEMET  IR Take 1 tablet by mouth. The timing of this medication is very important.   entacapone 200 MG tablet Commonly known as: COMTAN Take 200 mg by mouth 4 (four) times daily. The timing of this medication is very important.   acetaminophen  500 MG tablet Commonly known as: TYLENOL  Take 1 tablet (500 mg total) by mouth every 6 (six) hours as needed.   ALIGN PREBIOTIC-PROBIOTIC PO Take 2 tablets by mouth daily.   Calcium -Magnesium -Zinc  333-133-5 MG Tabs Take 1 tablet by mouth daily.   cetirizine 10 MG tablet Commonly known as: ZYRTEC Take 10 mg by mouth at bedtime.   ciprofloxacin  250 MG tablet Commonly known as: Cipro  Take 1 tablet (250 mg total) by mouth 2 (two) times daily.   conjugated estrogens 0.625 MG/GM vaginal cream Commonly known as: PREMARIN Place 1 g vaginally 3 (three) times a week. 1 drop on the urethral for urinary incontinence M-W-F   cycloSPORINE  0.05 % ophthalmic emulsion Commonly known as: RESTASIS  Place 1 drop into both eyes 2 (two) times daily.   docusate sodium  100 MG capsule Commonly known as: COLACE Take 1 capsule (100 mg total) by mouth 2 (two) times daily.   Fish Oil  1200 MG Caps Take 1 capsule by mouth daily.   fluticasone  50 MCG/ACT nasal spray Commonly known as: FLONASE  Place 1 spray into both nostrils 2 (two) times daily.   levothyroxine  50 MCG tablet Commonly known as: SYNTHROID  Take 50 mcg by mouth daily before breakfast.   meloxicam 15 MG tablet Commonly known as: MOBIC Take 15 mg by mouth daily.   metoprolol  succinate 25 MG 24 hr tablet Commonly known as: TOPROL -XL Take 12.5 mg by mouth daily.   midodrine  2.5 MG tablet Commonly known as: PROAMATINE  Take 1 tablet (2.5 mg total) by mouth 3 (three) times daily as needed (for top number for blood pressure lower than 90). What changed: when to take this   pantoprazole  40 MG tablet Commonly known as: PROTONIX  Take 40 mg by mouth 2 (two) times daily.   PARoxetine  10 MG tablet Commonly  known as: PAXIL  Take 10 mg by mouth at bedtime.   Polyethyl Glycol-Propyl Glycol 0.4-0.3 % Soln Place 1 drop into both eyes 2 (two) times daily.   polyethylene glycol powder 17 GM/SCOOP powder Commonly known as: GLYCOLAX /MIRALAX  Take 8.5 g by mouth daily. 1/2 scoop as needed   PreserVision AREDS 2 Caps Take 1 capsule by mouth 2 (two) times daily.   CENTRUM SILVER 50+WOMEN PO Take 1 tablet by mouth daily.   Prevagen 10 MG Caps Generic drug: Apoaequorin Take 1 capsule by mouth daily.   tobramycin-dexamethasone  ophthalmic solution Commonly known as: TOBRADEX Apply 1 drop to eye.   Vitamin D 125 MCG (5000 UT) Caps Take 1 capsule by mouth daily.   Voltaren Arthritis Pain 1 % Gel Generic drug: diclofenac Sodium Apply 2 g topically daily.        Allergies:  Allergies  Allergen Reactions   Diltiazem Hcl Hives   Cardizem [Diltiazem] Hives   Statins Other (See Comments)    Unknown reaction   Aloe Hives and Rash   Doxycycline Hives and Other (See Comments)    RASH - HAND ITCHING   Nitrofurantoin Hives and Rash    08-11-12 per pt caused Esophagitis and Urticaria   Other Other (See  Comments)    Unknown reaction   Penicillins Hives and Rash   Sulfamethoxazole-Trimethoprim Nausea Only    Family History: Family History  Problem Relation Age of Onset   Breast cancer Neg Hx     Social History:  reports that she has never smoked. She has never used smokeless tobacco. She reports that she does not drink alcohol and does not use drugs.  ROS: Pertinent ROS in HPI  Physical Exam: BP (!) 143/60   Pulse 77   Ht 5' 6 (1.676 m)   Wt 140 lb (63.5 kg)   SpO2 97%   BMI 22.60 kg/m   Constitutional:  Well nourished. Alert and oriented, No acute distress. HEENT: [place] AT, moist mucus membranes.  Trachea midline, no masses. Cardiovascular: No clubbing, cyanosis, or edema. Respiratory: Normal respiratory effort, no increased work of breathing. GU: No CVA tenderness.  No bladder fullness or masses.  Recession of labia minora, dry, pale vulvar vaginal mucosa and loss of mucosal ridges and folds.  Normal urethral meatus, no lesions, no prolapse, no discharge.   No urethral masses, tenderness and/or tenderness. No bladder fullness, tenderness or masses. *** vagina mucosa, *** estrogen effect, no discharge, no lesions, *** pelvic support, *** cystocele and *** rectocele noted.  No cervical motion tenderness.  Uterus is freely mobile and non-fixed.  No adnexal/parametria masses or tenderness noted.  Anus and perineum are without rashes or lesions.   ***  Neurologic: Grossly intact, no focal deficits, moving all 4 extremities. Psychiatric: Normal mood and affect.    Laboratory Data: See Epic and HPI   I have reviewed the labs.   Pertinent Imaging: N/A  Assessment & Plan:    1. rUTI's - UA **  2. Gross hematuria - UA ***  3. GSM -Patient will continue the vaginal estrogen cream, applying it 3 nights weekly   No follow-ups on file.  These notes generated with voice recognition software. I apologize for typographical errors.  CLOTILDA HELON RIGGERS  [facility] [address]  [address] [place], [place] [address] [phone]   "

## 2024-05-25 ENCOUNTER — Ambulatory Visit: Admitting: Urology

## 2024-05-25 VITALS — BP 143/60 | HR 77 | Ht 66.0 in | Wt 140.0 lb

## 2024-05-25 DIAGNOSIS — N390 Urinary tract infection, site not specified: Secondary | ICD-10-CM | POA: Diagnosis not present

## 2024-05-25 DIAGNOSIS — R310 Gross hematuria: Secondary | ICD-10-CM

## 2024-05-25 DIAGNOSIS — N958 Other specified menopausal and perimenopausal disorders: Secondary | ICD-10-CM

## 2024-05-25 LAB — URINALYSIS, COMPLETE
Bilirubin, UA: NEGATIVE
Glucose, UA: NEGATIVE
Leukocytes,UA: NEGATIVE
Nitrite, UA: NEGATIVE
Protein,UA: NEGATIVE
Specific Gravity, UA: 1.025 (ref 1.005–1.030)
Urobilinogen, Ur: 0.2 mg/dL (ref 0.2–1.0)
pH, UA: 6 (ref 5.0–7.5)

## 2024-05-25 LAB — MICROSCOPIC EXAMINATION

## 2024-05-27 ENCOUNTER — Encounter: Payer: Self-pay | Admitting: Urology

## 2024-05-30 NOTE — Progress Notes (Signed)
 "    05/31/2024 10:11 AM   Tracy Long 04/11/1935 [id number]  Referring provider: Rudolpho Norleen BIRCH, MD [address] [facility] [place],  [place] [address]  Urological history: 1. rUTI's -Contributing factors of age, GSM and constipation - March 20, 2024 - E.coli  - September 02, 2023, E. coli - Vaginal estrogen cream 3 nights weekly, probiotics, cranberry tablets, d-mannose and good hydration  2. Pelvic floor prolapse  Chief Complaint  Patient presents with   Recurrent UTI   HPI: Tracy Long is a 89 y.o. female who presents today for pelvic exam.   Previous records reviewed.   She was seen in November for a urinary tract infection associated with gross hematuria.  Her symptoms have abated.  Patient denies any modifying or aggravating factors.  Patient denies any recent UTI's, gross hematuria, dysuria or suprapubic/flank pain.  Patient denies any fevers, chills, nausea or vomiting.  UA yellow clear, specific gravity 1.025, pH 6.0, trace heme, trace ketone, 0-5 WBCs, 3-10 RBCs, 0-10 epithelial cells and few bacteria.  She has been applied a vaginal estrogen cream 3 nights weekly.  No changes since last visit.     PMH: Past Medical History:  Diagnosis Date   Arthritis    Chronically dry eyes    a.) on cyclosporine  gtts   Compression fracture of lumbar vertebra (HCC)    a.) s/p L1-L2 kyphoplasty   Coronary artery disease involving native coronary artery of native heart without angina pectoris    a.) s/p MI 1982; b.) s/p type II NSTEMI secondary to AVRT 08/31/2005: LHC 09/01/2005 - normal coronaries; c.) MV 11/03/2016: no ischemia   Depression    Diverticulosis    GERD (gastroesophageal reflux disease)    H/O bladder infections    History of bilateral cataract extraction 2003   History of right breast cancer 2006   a.) CNB (+) ADH with suspicion of DCIS --> s/p partial mastectomy 05/06/2001 (DCIS)   HLD (hyperlipidemia)    Hypertension     Hypothyroidism    Inverted nipple    Iron deficiency    Lower GI bleed 09/13/2022   a.) Tx'd by vascular 09/14/2022 --> s/p microbead embolization of LEFT colonic branch of IMA and splenic branch of SMA + coil embolization with 2 mm Ruby coils to splenic flexure branch off of LEFT colonic branch of IMA   MCI (mild cognitive impairment)    a.) takes apoequorin   Myocardial infarction (HCC) 1982   Osteopenia    Palpitations    Parkinson disease (HCC)    Polymyalgia rheumatica    Postoperative pulmonary embolism (HCC) 2013   a.) s/p LEFT TKA   PVD (peripheral vascular disease)    a.) s/p BILATERAL femoral stent placement in 2000   Raynaud's disease    Rheumatoid arthritis (HCC)    Supraventricular tachycardia    a.) s/p ablation for AVRT 2007; b.) rate/rhythm currently controlled with oral metoprolol  succinate   Type 2 myocardial infarction (HCC) 08/31/2005   a.) Type II NSTEMI r/t demand ischemia secondary to SVT/AVRT; b.) LHC 09/01/2005 - normal coronaries; no sig CAD.    Surgical History: Past Surgical History:  Procedure Laterality Date   BILATERAL FEMORAL STENTS Bilateral 2000   BREAST EXCISIONAL BIOPSY Bilateral    [facility] (BENIGN MASS)   BREAST LUMPECTOMY Right 04/2005   CARDIAC CATHETERIZATION  09/01/2005   CATARACT EXTRACTION W/ INTRAOCULAR LENS  IMPLANT, BILATERAL Bilateral 2003   COLONOSCOPY WITH PROPOFOL  N/A 09/14/2022   Procedure: COLONOSCOPY  WITH PROPOFOL ;  Surgeon: Onita Elspeth Sharper, DO;  Location: [facility] ENDOSCOPY;  Service: Gastroenterology;  Laterality: N/A;   EMBOLIZATION (CATH LAB) N/A 09/14/2022   Procedure: EMBOLIZATION;  Surgeon: Marea Selinda RAMAN, MD;  Location: [facility] INVASIVE CV LAB;  Service: Cardiovascular;  Laterality: N/A;   EPIRETINAL MEMBRANE REMOVED Bilateral    ESOPHAGOGASTRODUODENOSCOPY  08/21/2012   ESOPHAGOGASTRODUODENOSCOPY N/A 07/14/2023   Procedure: ESOPHAGOGASTRODUODENOSCOPY (EGD);  Surgeon: Onita Elspeth Sharper, DO;  Location: [facility] ENDOSCOPY;   Service: Gastroenterology;  Laterality: N/A;   L1-L2 KYPHOPLASTY N/A    POSTERIOR TIBIAL TENDON REPAIR Left    SKIN BIOPSY     SVT ABLATION N/A 2007   TEMPORAL ARTERY BIOPSY / LIGATION     TOTAL ABDOMINAL HYSTERECTOMY W/ BILATERAL SALPINGOOPHORECTOMY  1985   TOTAL KNEE ARTHROPLASTY Left 01/10/2012   TOTAL KNEE ARTHROPLASTY Right 12/20/2022   Procedure: TOTAL KNEE ARTHROPLASTY;  Surgeon: Lorelle Hussar, MD;  Location: [facility] ORS;  Service: Orthopedics;  Laterality: Right;   TUBAL LIGATION      Home Medications:  Allergies as of 05/31/2024       Reactions   Diltiazem Hcl Hives   Cardizem [diltiazem] Hives   Statins Other (See Comments)   Unknown reaction   Aloe Hives, Rash   Doxycycline Hives, Other (See Comments)   RASH - HAND ITCHING   Nitrofurantoin Hives, Rash   08-11-12 per pt caused Esophagitis and Urticaria   Other Other (See Comments)   Unknown reaction   Penicillins Hives, Rash   Sulfamethoxazole-trimethoprim Nausea Only        Medication List        Accurate as of May 31, 2024 10:11 AM. If you have any questions, ask your nurse or doctor.          carbidopa -levodopa  25-100 MG tablet Commonly known as: SINEMET  IR Take 1 tablet by mouth. The timing of this medication is very important.   entacapone 200 MG tablet Commonly known as: COMTAN Take 200 mg by mouth 4 (four) times daily. The timing of this medication is very important.   acetaminophen  500 MG tablet Commonly known as: TYLENOL  Take 1 tablet (500 mg total) by mouth every 6 (six) hours as needed.   ALIGN PREBIOTIC-PROBIOTIC PO Take 2 tablets by mouth daily.   Calcium -Magnesium -Zinc  333-133-5 MG Tabs Take 1 tablet by mouth daily.   cetirizine 10 MG tablet Commonly known as: ZYRTEC Take 10 mg by mouth at bedtime.   ciprofloxacin  250 MG tablet Commonly known as: Cipro  Take 1 tablet (250 mg total) by mouth 2 (two) times daily.   conjugated estrogens 0.625 MG/GM vaginal cream Commonly  known as: PREMARIN Place 1 g vaginally 3 (three) times a week. 1 drop on the urethral for urinary incontinence M-W-F   cycloSPORINE  0.05 % ophthalmic emulsion Commonly known as: RESTASIS  Place 1 drop into both eyes 2 (two) times daily.   docusate sodium  100 MG capsule Commonly known as: COLACE Take 1 capsule (100 mg total) by mouth 2 (two) times daily.   Fish Oil 1200 MG Caps Take 1 capsule by mouth daily.   fluticasone  50 MCG/ACT nasal spray Commonly known as: FLONASE  Place 1 spray into both nostrils 2 (two) times daily.   levothyroxine  50 MCG tablet Commonly known as: SYNTHROID  Take 50 mcg by mouth daily before breakfast.   meloxicam 15 MG tablet Commonly known as: MOBIC Take 15 mg by mouth daily.   metoprolol  succinate 25 MG 24 hr tablet Commonly known as: TOPROL -XL Take 12.5 mg by mouth  daily.   midodrine  2.5 MG tablet Commonly known as: PROAMATINE  Take 1 tablet (2.5 mg total) by mouth 3 (three) times daily as needed (for top number for blood pressure lower than 90). What changed: when to take this   pantoprazole  40 MG tablet Commonly known as: PROTONIX  Take 40 mg by mouth 2 (two) times daily.   PARoxetine  10 MG tablet Commonly known as: PAXIL  Take 10 mg by mouth at bedtime.   Polyethyl Glycol-Propyl Glycol 0.4-0.3 % Soln Place 1 drop into both eyes 2 (two) times daily.   polyethylene glycol powder 17 GM/SCOOP powder Commonly known as: GLYCOLAX /MIRALAX  Take 8.5 g by mouth daily. 1/2 scoop as needed   PreserVision AREDS 2 Caps Take 1 capsule by mouth 2 (two) times daily.   CENTRUM SILVER 50+WOMEN PO Take 1 tablet by mouth daily.   Prevagen 10 MG Caps Generic drug: Apoaequorin Take 1 capsule by mouth daily.   tobramycin-dexamethasone  ophthalmic solution Commonly known as: TOBRADEX Apply 1 drop to eye.   Vitamin D 125 MCG (5000 UT) Caps Take 1 capsule by mouth daily.   Voltaren Arthritis Pain 1 % Gel Generic drug: diclofenac Sodium Apply 2 g  topically daily.        Allergies:  Allergies  Allergen Reactions   Diltiazem Hcl Hives   Cardizem [Diltiazem] Hives   Statins Other (See Comments)    Unknown reaction   Aloe Hives and Rash   Doxycycline Hives and Other (See Comments)    RASH - HAND ITCHING   Nitrofurantoin Hives and Rash    08-11-12 per pt caused Esophagitis and Urticaria   Other Other (See Comments)    Unknown reaction   Penicillins Hives and Rash   Sulfamethoxazole-Trimethoprim Nausea Only    Family History: Family History  Problem Relation Age of Onset   Breast cancer Neg Hx     Social History:  reports that she has never smoked. She has never used smokeless tobacco. She reports that she does not drink alcohol and does not use drugs.  ROS: Pertinent ROS in HPI  Physical Exam: BP (!) 140/65   Pulse 69   Wt 140 lb (63.5 kg)   SpO2 97%   BMI 22.60 kg/m   Constitutional:  Well nourished. Alert and oriented, No acute distress. HEENT: [place] AT, moist mucus membranes.  Trachea midline Cardiovascular: No clubbing, cyanosis, or edema. Respiratory: Normal respiratory effort, no increased work of breathing. GU: No CVA tenderness.  No bladder fullness or masses.  Recession of labia minora, dry, pale vulvar vaginal mucosa and loss of mucosal ridges and folds.  Normal urethral meatus, no lesions, no prolapse, no discharge.   No urethral masses, tenderness and/or tenderness. No bladder fullness, tenderness or masses. Pale vagina mucosa, fair estrogen effect, no discharge, no lesions, fair pelvic support, grade I cystocele and no rectocele noted.  Anus and perineum are without rashes or lesions.     Neurologic: Grossly intact, no focal deficits, moving all 4 extremities. Psychiatric: Normal mood and affect.    Laboratory Data: See Epic and HPI   I have reviewed the labs.   Pertinent Imaging: N/A  Assessment & Plan:    1. rUTI's - UA w/ micro heme - She is asymptomatic after shared decision making we  decided return in 6 months for repeat UA to ensure the microscopic hematuria resolves, if it persists we will pursue a renal ultrasound  2. Gross hematuria - UA w/ 3-10 RBC's  - No caruncle or vaginal irritation  to explain hematuria - Will recheck UA in 6 months - She will notify us  if she should experience any UTI symptoms or gross hematuria in the interim  3. GSM -Patient will continue the vaginal estrogen cream, applying it 3 nights weekly   Return in about 6 months (around 11/28/2024) for UA only for micro heme .  These notes generated with voice recognition software. I apologize for typographical errors.  Tracy Long  [facility] [address]  [address] [place], [place] [address] [phone]   "

## 2024-05-31 ENCOUNTER — Ambulatory Visit: Admitting: Urology

## 2024-05-31 ENCOUNTER — Encounter: Payer: Self-pay | Admitting: Urology

## 2024-05-31 VITALS — BP 140/65 | HR 69 | Wt 140.0 lb

## 2024-05-31 DIAGNOSIS — N958 Other specified menopausal and perimenopausal disorders: Secondary | ICD-10-CM | POA: Diagnosis not present

## 2024-05-31 DIAGNOSIS — R310 Gross hematuria: Secondary | ICD-10-CM | POA: Diagnosis not present

## 2024-05-31 DIAGNOSIS — N390 Urinary tract infection, site not specified: Secondary | ICD-10-CM

## 2024-11-28 ENCOUNTER — Other Ambulatory Visit
